# Patient Record
Sex: Female | Born: 1990 | Race: Black or African American | Hispanic: No | Marital: Single | Smoking: Former smoker
Health system: Southern US, Community
[De-identification: ages and names within clinical notes are randomized; demographics above are authoritative.]

## PROBLEM LIST (undated history)

## (undated) ENCOUNTER — Inpatient Hospital Stay (HOSPITAL_COMMUNITY): Payer: Self-pay

## (undated) DIAGNOSIS — R51 Headache: Secondary | ICD-10-CM

## (undated) DIAGNOSIS — R188 Other ascites: Secondary | ICD-10-CM

## (undated) DIAGNOSIS — N809 Endometriosis, unspecified: Secondary | ICD-10-CM

## (undated) DIAGNOSIS — R519 Headache, unspecified: Secondary | ICD-10-CM

## (undated) DIAGNOSIS — I10 Essential (primary) hypertension: Secondary | ICD-10-CM

## (undated) DIAGNOSIS — R011 Cardiac murmur, unspecified: Secondary | ICD-10-CM

## (undated) HISTORY — PX: NO PAST SURGERIES: SHX2092

## (undated) HISTORY — DX: Other ascites: R18.8

---
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
## 2009-02-20 ENCOUNTER — Emergency Department (HOSPITAL_COMMUNITY): Admission: EM | Admit: 2009-02-20 | Discharge: 2009-02-20 | Payer: Self-pay | Admitting: Family Medicine

## 2010-02-12 ENCOUNTER — Emergency Department (HOSPITAL_COMMUNITY)
Admission: EM | Admit: 2010-02-12 | Discharge: 2010-02-12 | Payer: Self-pay | Source: Home / Self Care | Admitting: Family Medicine

## 2010-10-09 LAB — POCT URINALYSIS DIP (DEVICE)
Bilirubin Urine: NEGATIVE
Glucose, UA: NEGATIVE mg/dL
Ketones, ur: NEGATIVE mg/dL
Nitrite: NEGATIVE
Protein, ur: NEGATIVE mg/dL
Specific Gravity, Urine: 1.025 (ref 1.005–1.030)
Urobilinogen, UA: 1 mg/dL (ref 0.0–1.0)
pH: 7 (ref 5.0–8.0)

## 2010-10-09 LAB — POCT I-STAT, CHEM 8
BUN: 7 mg/dL (ref 6–23)
Calcium, Ion: 1.32 mmol/L (ref 1.12–1.32)
Chloride: 106 mEq/L (ref 96–112)
Creatinine, Ser: 0.6 mg/dL (ref 0.4–1.2)
Glucose, Bld: 107 mg/dL — ABNORMAL HIGH (ref 70–99)
HCT: 40 % (ref 36.0–46.0)
Hemoglobin: 13.6 g/dL (ref 12.0–15.0)
Potassium: 4 mEq/L (ref 3.5–5.1)
Sodium: 141 mEq/L (ref 135–145)
TCO2: 23 mmol/L (ref 0–100)

## 2010-10-09 LAB — POCT PREGNANCY, URINE: Preg Test, Ur: NEGATIVE

## 2011-03-30 LAB — STREP A DNA PROBE: Group A Strep Probe: NEGATIVE

## 2011-03-30 LAB — POCT RAPID STREP A: Streptococcus, Group A Screen (Direct): NEGATIVE

## 2011-06-08 ENCOUNTER — Emergency Department (INDEPENDENT_AMBULATORY_CARE_PROVIDER_SITE_OTHER)
Admission: EM | Admit: 2011-06-08 | Discharge: 2011-06-08 | Disposition: A | Discharge status: Home / Self Care | Payer: Self-pay | Source: Home / Self Care

## 2011-06-08 DIAGNOSIS — R51 Headache: Secondary | ICD-10-CM

## 2011-06-08 DIAGNOSIS — H04203 Unspecified epiphora, bilateral lacrimal glands: Secondary | ICD-10-CM

## 2011-06-08 DIAGNOSIS — H04209 Unspecified epiphora, unspecified lacrimal gland: Secondary | ICD-10-CM

## 2011-06-08 HISTORY — DX: Cardiac murmur, unspecified: R01.1

## 2011-06-08 MED ORDER — SULFACETAMIDE SODIUM 10 % OP SOLN: 2.0000 [drp] | Freq: Three times a day (TID) | OPHTHALMIC | Status: AC

## 2011-06-08 NOTE — ED Provider Notes (Signed)
History     CSN: [id number] Arrival date & time: 06/08/2011 10:03 AM   None     Chief Complaint  Patient presents with  . Eye Pain    (Consider location/radiation/quality/duration/timing/severity/associated sxs/prior treatment) HPI Comments: Watering of Rt eye daily x 2 months - increased tearing. No redness, itching or irritation. Rt eye occasionally blurry. Wears glasses no contacts. States she has had 2 "migraines" in the last 2 months also. She has not been previously dx with migraines. Has a hx of these same HAs previously. Rt sided, throbbing. Lasts approx 4 hrs. Relieved with otc pain relievers such as Tylenol or Ibuprofen and rest. No nausea or vomiting, photophobia, phonophobia, numbness, tingling.  States the blurred vision in Rt eye occurs with the HA.   The history is provided by the patient.    Past Medical History  Diagnosis Date  . Heart murmur     History reviewed. No pertinent past surgical history.  History reviewed. No pertinent family history.  History  Substance Use Topics  . Smoking status: Current Everyday Smoker -- 0.5 packs/day  . Smokeless tobacco: Not on file  . Alcohol Use: Yes     occasional    OB History    Grav Para Term Preterm Abortions TAB SAB Ect Mult Living                  Review of Systems  Constitutional: Negative for fever and chills.  HENT: Negative for congestion and sinus pressure.   Eyes: Negative for photophobia, pain, discharge, redness, itching and visual disturbance.  Respiratory: Negative for shortness of breath.   Cardiovascular: Negative for chest pain.  Neurological: Positive for headaches. Negative for dizziness, weakness, light-headedness and numbness.    Allergies  Review of patient's allergies indicates no known allergies.  Home Medications  No current outpatient prescriptions on file.  BP 134/79  Pulse 71  Temp(Src) 98 F (36.7 C) (Oral)  Resp 17  SpO2 100%  LMP 06/01/2011  Physical Exam    Nursing note and vitals reviewed. Constitutional: She appears well-developed and well-nourished. No distress.  HENT:  Head: Normocephalic and atraumatic.  Right Ear: Tympanic membrane, external ear and ear canal normal.  Left Ear: Tympanic membrane, external ear and ear canal normal.  Nose: Nose normal.  Mouth/Throat: Uvula is midline, oropharynx is clear and moist and mucous membranes are normal. No oropharyngeal exudate, posterior oropharyngeal edema or posterior oropharyngeal erythema.  Eyes: Conjunctivae, EOM and lids are normal. Pupils are equal, round, and reactive to light.  Fundoscopic exam:      The right eye shows no AV nicking, no hemorrhage and no papilledema.       The left eye shows no AV nicking, no hemorrhage and no papilledema.  Neck: Neck supple.  Cardiovascular: Normal rate, regular rhythm and normal heart sounds.   Pulmonary/Chest: Effort normal and breath sounds normal. No respiratory distress.  Lymphadenopathy:    She has no cervical adenopathy.  Neurological: She is alert. She has normal strength. No cranial nerve deficit. Gait normal.  Skin: Skin is warm and dry.  Psychiatric: She has a normal mood and affect.    ED Course  Procedures (including critical care time)  Labs Reviewed - No data to display No results found.   No diagnosis found.    MDM          Melody Comas, PA 06/08/11 1135

## 2011-06-08 NOTE — ED Notes (Signed)
C/o frequent watering of rt eye for 2 months, states she occasionally has rt side headache and vision in rt eye is occasionally blurred.  Denies injury or pain.  Wears glasses, no recent eye exam.  Hasnt been wearing glasses

## 2011-06-09 NOTE — ED Provider Notes (Signed)
Medical screening examination/treatment/procedure(s) were performed by non-physician practitioner and as supervising physician I was immediately available for consultation/collaboration.   MORENO-COLL,Jaire Pinkham; MD   Ranier Coach Moreno-Coll, MD 06/09/11 0740 

## 2013-04-08 ENCOUNTER — Encounter (HOSPITAL_COMMUNITY): Payer: Self-pay | Admitting: Emergency Medicine

## 2013-04-08 ENCOUNTER — Emergency Department (INDEPENDENT_AMBULATORY_CARE_PROVIDER_SITE_OTHER)
Admission: EM | Admit: 2013-04-08 | Discharge: 2013-04-08 | Disposition: A | Discharge status: Home / Self Care | Payer: Self-pay | Source: Home / Self Care | Attending: Family Medicine | Admitting: Family Medicine

## 2013-04-08 DIAGNOSIS — L089 Local infection of the skin and subcutaneous tissue, unspecified: Secondary | ICD-10-CM

## 2013-04-08 DIAGNOSIS — L723 Sebaceous cyst: Secondary | ICD-10-CM

## 2013-04-08 MED ORDER — SULFAMETHOXAZOLE-TRIMETHOPRIM 800-160 MG PO TABS: 1.0000 | ORAL_TABLET | Freq: Two times a day (BID) | ORAL | Status: DC

## 2013-04-08 NOTE — ED Provider Notes (Signed)
Angelica Blankenship is a 22 y.o. female who presents to Urgent Care today for left axillary abscess. Patient is noted pain and swelling in her left ankle about one week. She has a history of abscesses there frequently in the past. She's tried warm compress which hasn't helped much. He does seem to be draining some small amount of pus. She denies any fevers chills nausea vomiting or diarrhea. She feels well otherwise.    Past Medical History  Diagnosis Date  . Heart murmur    History  Substance Use Topics  . Smoking status: Current Every Day Smoker -- 0.50 packs/day  . Smokeless tobacco: Not on file  . Alcohol Use: Yes     Comment: occasional   ROS as above Medications reviewed. No current facility-administered medications for this encounter.   Current Outpatient Prescriptions  Medication Sig Dispense Refill  . sulfamethoxazole-trimethoprim (SEPTRA DS) 800-160 MG per tablet Take 1 tablet by mouth every 12 (twelve) hours.  14 tablet  0    Exam:  BP 140/74  Pulse 80  Temp(Src) 98.3 F (36.8 C) (Oral)  Resp 16  SpO2 100%  LMP 04/07/2013 Gen: Well NAD LEFT AXILLA: Quarter size area of induration with small nickel-sized area of fluctuance. Tender to palpation. Erythematous present. Some small amount of whitish material is expressible.   Left axilla incision and drainage of abscess.  Consent obtained.  Area cleaned with alcohol and cold spray applied and 3 mL of lidocaine with epinephrine was injected overlying the area of fluctuance.  A sharp incision was made is 11 blade scalpel and a great quantity of pus was expressed. Pus was cultured. Further pus was expressed including what appeared to be cyst wall structures.  Loculations were broken up with wooden Q-tips. Further pus was expressed.  The incision was widened and about 6 inches of quarter inch packing material was inserted into the abscess. A dressing was applied Patient tolerated the procedure well  Assessment and Plan: 21  y.o. female with infected sebaceous cyst of the left axilla.  The cyst has been drained and packed Culture is pending Empiric antibiotics with Septra.  Remove packing material in 3 days followup as needed Discussed warning signs or symptoms. Please see discharge instructions. Patient expresses understanding.      Rodolph Bong, MD 04/08/13 [phone]

## 2013-04-08 NOTE — ED Notes (Signed)
C/o of abscess under left axillary x 2 wks. Pt states that she has had some drainage. Area is still red and swollen denies pain at this time.

## 2013-04-11 LAB — CULTURE, ROUTINE-ABSCESS: Special Requests: NORMAL

## 2014-04-02 ENCOUNTER — Encounter (HOSPITAL_COMMUNITY): Payer: Self-pay | Admitting: Emergency Medicine

## 2014-04-02 ENCOUNTER — Emergency Department (INDEPENDENT_AMBULATORY_CARE_PROVIDER_SITE_OTHER)
Admission: EM | Admit: 2014-04-02 | Discharge: 2014-04-02 | Disposition: A | Discharge status: Home / Self Care | Payer: Self-pay | Source: Home / Self Care | Attending: Emergency Medicine | Admitting: Emergency Medicine

## 2014-04-02 DIAGNOSIS — IMO0002 Reserved for concepts with insufficient information to code with codable children: Secondary | ICD-10-CM

## 2014-04-02 DIAGNOSIS — L02411 Cutaneous abscess of right axilla: Secondary | ICD-10-CM

## 2014-04-02 MED ORDER — LIDOCAINE-EPINEPHRINE (PF) 2 %-1:200000 IJ SOLN
INTRAMUSCULAR | Status: AC
  Filled 2014-04-02: qty 20

## 2014-04-02 NOTE — Discharge Instructions (Signed)

## 2014-04-02 NOTE — ED Notes (Signed)
Reports abscess right side axilla onset 2 months Pain is 5/10; denies drainage Alert, no signs of acute distress.

## 2014-04-02 NOTE — ED Notes (Signed)
Applied 4x4 gauze; secured w/hypofix

## 2014-04-02 NOTE — ED Provider Notes (Signed)
CSN: [id number]     Arrival date & time 04/02/14  1628 History   First MD Initiated Contact with Patient 04/02/14 1705     Chief Complaint  Patient presents with  . Abscess   (Consider location/radiation/quality/duration/timing/severity/associated sxs/prior Treatment) Patient is a 23 y.o. female presenting with abscess.  Abscess     23 year old female presents for evaluation of an abscess in her right axilla. She has pain and swelling present for about 2 months. It comes and goes but has never drained. She has had this before in the left axilla, and a couple of times in the right. She denies any systemic symptoms. It is painful, 5/10 in severity. Tetanus is up-to-date  Past Medical History  Diagnosis Date  . Heart murmur    History reviewed. No pertinent past surgical history. No family history on file. History  Substance Use Topics  . Smoking status: Current Every Day Smoker -- 1.00 packs/day    Types: Cigarettes  . Smokeless tobacco: Not on file  . Alcohol Use: Yes     Comment: occasional   OB History   Grav Para Term Preterm Abortions TAB SAB Ect Mult Living                 Review of Systems  Skin:       Abscess in right axilla, see history of present illness  All other systems reviewed and are negative.   Allergies  Review of patient's allergies indicates no known allergies.  Home Medications   Prior to Admission medications   Medication Sig Start Date End Date Taking? Authorizing Provider  sulfamethoxazole-trimethoprim (SEPTRA DS) 800-160 MG per tablet Take 1 tablet by mouth every 12 (twelve) hours. 04/08/13   Rodolph BongEvan S Corey, MD   BP 126/67  Pulse 68  Temp(Src) 97.9 F (36.6 C) (Oral)  Resp 12  SpO2 100%  LMP 04/02/2014 Physical Exam  Nursing note and vitals reviewed. Constitutional: She is oriented to person, place, and time. Vital signs are normal. She appears well-developed and well-nourished. No distress.  HENT:  Head: Normocephalic and atraumatic.   Cardiovascular: Normal rate, regular rhythm and normal heart sounds.   Pulmonary/Chest: Effort normal and breath sounds normal. No respiratory distress.  Lymphadenopathy:    She has no axillary adenopathy.  Neurological: She is alert and oriented to person, place, and time. She has normal strength. Coordination normal.  Skin: Skin is warm and dry. No rash noted. She is not diaphoretic.  Area of fluctuance with surrounding induration in the right axilla, tender to palpation; no similar lesions elsewhere, no axillary lymphadenopathy  Psychiatric: She has a normal mood and affect. Judgment normal.    ED Course  INCISION AND DRAINAGE Date/Time: 04/02/2014 9:14 PM Performed by: Autumn MessingBAKER, Nealy Karapetian, H Authorized by: Leslee HomeKELLER, DAVID C Consent: Verbal consent obtained. Risks and benefits: risks, benefits and alternatives were discussed Consent given by: patient Patient identity confirmed: verbally with patient Time out: Immediately prior to procedure a "time out" was called to verify the correct patient, procedure, equipment, support staff and site/side marked as required. Type: abscess Body area: upper extremity (Right axilla) Anesthesia: local infiltration Local anesthetic: lidocaine 2% with epinephrine Scalpel size: 11 Incision type: single straight Complexity: simple Drainage: purulent Drainage amount: moderate Wound treatment: wound left open Packing material: 1/4 in iodoform gauze Patient tolerance: Patient tolerated the procedure well with no immediate complications. Comments: Blunt dissection to break up loculations. Abscess cavity flushed with copious normal saline.   (including critical care time) Labs  Review Labs Reviewed  CULTURE, ROUTINE-ABSCESS    Imaging Review No results found.   MDM   1. Abscess of axilla, right    Abscess, incised and drained. There is a small abscess, no antibiotics indicated at this point. Culture sent in case it is worsening. Schooley the  packing in place for 4 days and then remove it, and then continue warm soaks a few times daily. Followup when necessary    Graylon Good, PA-C 04/02/14 2118

## 2014-04-02 NOTE — ED Provider Notes (Signed)
Medical screening examination/treatment/procedure(s) were performed by non-physician practitioner and as supervising physician I was immediately available for consultation/collaboration.  Leslee Homeavid Markice Torbert, M.D.  Reuben Likesavid C Kandise Riehle, MD 04/02/14 2154

## 2014-04-05 LAB — CULTURE, ROUTINE-ABSCESS: Special Requests: NORMAL

## 2014-04-05 NOTE — ED Notes (Signed)
Abscess R axilla: Few Proteus Mirabilis.  Pt. had I and D. Dr. Lorenz CoasterKeller said it was small- no further action needed. Angelica Blankenship, Savanah Bayles M 04/05/2014

## 2014-06-04 ENCOUNTER — Inpatient Hospital Stay (HOSPITAL_COMMUNITY)
Admission: AD | Admit: 2014-06-04 | Discharge: 2014-06-05 | Disposition: A | Discharge status: Home / Self Care | Payer: Self-pay | Source: Ambulatory Visit | Attending: Obstetrics & Gynecology | Admitting: Obstetrics & Gynecology

## 2014-06-04 ENCOUNTER — Encounter (HOSPITAL_COMMUNITY): Payer: Self-pay

## 2014-06-04 DIAGNOSIS — R110 Nausea: Secondary | ICD-10-CM | POA: Insufficient documentation

## 2014-06-04 DIAGNOSIS — R1030 Lower abdominal pain, unspecified: Secondary | ICD-10-CM | POA: Insufficient documentation

## 2014-06-04 LAB — URINALYSIS, ROUTINE W REFLEX MICROSCOPIC
Bilirubin Urine: NEGATIVE
Glucose, UA: NEGATIVE mg/dL
Ketones, ur: 15 mg/dL — AB
Leukocytes, UA: NEGATIVE
Nitrite: NEGATIVE
Protein, ur: NEGATIVE mg/dL
Specific Gravity, Urine: >1.03 — ABNORMAL HIGH (ref 1.005–1.030)
Urobilinogen, UA: 1 mg/dL (ref 0.0–1.0)
pH: 6 (ref 5.0–8.0)

## 2014-06-04 LAB — URINE MICROSCOPIC-ADD ON

## 2014-06-04 LAB — POCT PREGNANCY, URINE: Preg Test, Ur: NEGATIVE

## 2014-06-04 NOTE — MAU Note (Signed)
Pt reports abdominal pain off and on since Saturday. Pt states that the pain is both sharp and crampy. Pt reports bleeding since today.

## 2014-06-04 NOTE — MAU Note (Signed)
Lower abdominal pain since Saturday. Start bleeding today, thinks its her period. LMP before now 10/24. Normally has regular periods. Denies urinary complaints. Nausea. No vomiting or diarrhea.

## 2014-06-05 ENCOUNTER — Encounter (HOSPITAL_COMMUNITY): Payer: Self-pay | Admitting: Family Medicine

## 2014-06-05 ENCOUNTER — Emergency Department (INDEPENDENT_AMBULATORY_CARE_PROVIDER_SITE_OTHER)
Admission: EM | Admit: 2014-06-05 | Discharge: 2014-06-05 | Disposition: A | Discharge status: Home / Self Care | Payer: Self-pay | Source: Home / Self Care | Attending: Family Medicine | Admitting: Family Medicine

## 2014-06-05 DIAGNOSIS — R109 Unspecified abdominal pain: Secondary | ICD-10-CM

## 2014-06-05 DIAGNOSIS — N946 Dysmenorrhea, unspecified: Secondary | ICD-10-CM

## 2014-06-05 MED ORDER — IBUPROFEN 600 MG PO TABS: 600.0000 mg | ORAL_TABLET | Freq: Four times a day (QID) | ORAL | Status: DC | PRN

## 2014-06-05 MED ORDER — ONDANSETRON 4 MG PO TBDP: 4.0000 mg | ORAL_TABLET | Freq: Three times a day (TID) | ORAL | Status: DC | PRN

## 2014-06-05 NOTE — ED Provider Notes (Signed)
CSN: [id number]     Arrival date & time 06/05/14  [id number] History   First MD Initiated Contact with Patient 06/05/14 0857     Chief Complaint  Patient presents with  . Abdominal Pain   (Consider location/radiation/quality/duration/timing/severity/associated sxs/prior Treatment) HPI  Abd pain: off and on since 05/31/14. Initially sharp but now crampy and achy. Taking OTC period pain medication. LMP 06/04/14. Pain is worse when laying down. Daily BMs. Sexually active w/ intermittent protection. Denies dysuria, frequency, vaginal discharge, fever, nausea, vomting, diarrhea. Period are regular but this is more painful than typical period.   [facility]en yesterday at [facility] for same symptoms but left before being treated.     Past Medical History  Diagnosis Date  . Heart murmur    History reviewed. No pertinent past surgical history. History reviewed. No pertinent family history. History  Substance Use Topics  . Smoking status: Current Every Day Smoker -- 1.00 packs/day    Types: Pipe  . Smokeless tobacco: Not on file  . Alcohol Use: Yes     Comment: occasional   OB History    Gravida Para Term Preterm AB TAB SAB Ectopic Multiple Living   1    1 1          Review of Systems Per HPI with all other pertinent systems negative.   Allergies  Review of patient's allergies indicates no known allergies.  Home Medications   Prior to Admission medications   Medication Sig Start Date End Date Taking? Authorizing Provider  acetaminophen (TYLENOL) 500 MG tablet Take 1,000 mg by mouth every 6 (six) hours as needed.   Yes Historical Provider, MD  APAP-Pamabrom-Pyrilamine (PAMPRIN MULTI-SYMPTOM) 500-25-15 MG TABS Take by mouth.   Yes Historical Provider, MD  aspirin-acetaminophen-caffeine (EXCEDRIN MIGRAINE) [phone] MG per tablet Take 2 tablets by mouth every 6 (six) hours as needed for headache.    Historical Provider, MD  ibuprofen (ADVIL,MOTRIN) 600 MG tablet Take 1 tablet (600 mg total) by mouth  every 6 (six) hours as needed. 06/05/14   Ozella Rocksavid J Audie Wieser, MD  ondansetron (ZOFRAN ODT) 4 MG disintegrating tablet Take 1 tablet (4 mg total) by mouth every 8 (eight) hours as needed for nausea or vomiting. 06/05/14   Ozella Rocksavid J Sueellen Kayes, MD  sulfamethoxazole-trimethoprim (SEPTRA DS) 800-160 MG per tablet Take 1 tablet by mouth every 12 (twelve) hours. 04/08/13   Rodolph BongEvan S Corey, MD   BP 122/79 mmHg  Pulse 72  Temp(Src) 99.1 F (37.3 C) (Oral)  Resp 14  SpO2 96%  LMP 06/04/2014 Physical Exam  Constitutional: She is oriented to person, place, and time. She appears well-developed and well-nourished. No distress.  HENT:  Head: Normocephalic and atraumatic.  Eyes: EOM are normal. Pupils are equal, round, and reactive to light.  Neck: Normal range of motion.  Cardiovascular: Normal rate, normal heart sounds and intact distal pulses.   No murmur heard. Pulmonary/Chest: Effort normal and breath sounds normal.  Abdominal: Soft. Bowel sounds are normal. She exhibits no distension and no mass. There is no tenderness. There is no rebound and no guarding.  Musculoskeletal: Normal range of motion. She exhibits no tenderness.  Neurological: She is alert and oriented to person, place, and time.  Skin: Skin is warm. She is not diaphoretic.  Psychiatric: She has a normal mood and affect. Her behavior is normal. Judgment and thought content normal.    ED Course  Procedures (including critical care time) Labs Review Labs Reviewed - No data to display  Imaging Review No  results found.   MDM   1. Menstrual cramp   2. Abdominal pain, unspecified abdominal location    Likely menstrual pain. UA and Upreg from [facility] yesterday nml. No need to repeat today Daily BM so constipation unlikely. No sign of vaginitis Zofran for occasional nausea  Motrin 600 prn pain F/u OBGYN - may need US for further evaulation  Precautions given and all questions answered   Shelly Flattenavid Xayne Brumbaugh, MD Family Medicine 06/05/2014, 9:18  AM     Ozella Rocksavid J Amando Chaput, MD 06/05/14 [phone]

## 2014-06-05 NOTE — Discharge Instructions (Signed)
You are likely suffering from menstrual pain or ovarian cysts or endometriosis There are no emergent issues today Please call and schedule an appointment with an OBGYN for follow-up Use the zofran for nausea and iburpofen for pain

## 2014-06-05 NOTE — ED Notes (Signed)
Pt states that she has had abdominal pain since 05/31/2014 with no relief from OTC Pamprin and tylenol.

## 2014-07-02 ENCOUNTER — Inpatient Hospital Stay (HOSPITAL_COMMUNITY)
Admission: AD | Admit: 2014-07-02 | Discharge: 2014-07-02 | Disposition: A | Discharge status: Home / Self Care | Payer: Self-pay | Source: Ambulatory Visit | Attending: Obstetrics & Gynecology | Admitting: Obstetrics & Gynecology

## 2014-07-02 ENCOUNTER — Inpatient Hospital Stay (HOSPITAL_COMMUNITY): Payer: Self-pay

## 2014-07-02 ENCOUNTER — Encounter (HOSPITAL_COMMUNITY): Payer: Self-pay

## 2014-07-02 DIAGNOSIS — Z3A01 Less than 8 weeks gestation of pregnancy: Secondary | ICD-10-CM | POA: Insufficient documentation

## 2014-07-02 DIAGNOSIS — F17290 Nicotine dependence, other tobacco product, uncomplicated: Secondary | ICD-10-CM | POA: Insufficient documentation

## 2014-07-02 DIAGNOSIS — O99331 Smoking (tobacco) complicating pregnancy, first trimester: Secondary | ICD-10-CM | POA: Insufficient documentation

## 2014-07-02 DIAGNOSIS — N8320 Unspecified ovarian cysts: Secondary | ICD-10-CM | POA: Insufficient documentation

## 2014-07-02 DIAGNOSIS — R109 Unspecified abdominal pain: Secondary | ICD-10-CM | POA: Insufficient documentation

## 2014-07-02 DIAGNOSIS — O9989 Other specified diseases and conditions complicating pregnancy, childbirth and the puerperium: Secondary | ICD-10-CM

## 2014-07-02 DIAGNOSIS — O3481 Maternal care for other abnormalities of pelvic organs, first trimester: Secondary | ICD-10-CM | POA: Insufficient documentation

## 2014-07-02 DIAGNOSIS — O26899 Other specified pregnancy related conditions, unspecified trimester: Secondary | ICD-10-CM

## 2014-07-02 LAB — URINALYSIS, ROUTINE W REFLEX MICROSCOPIC
Bilirubin Urine: NEGATIVE
Glucose, UA: NEGATIVE mg/dL
Hgb urine dipstick: NEGATIVE
Ketones, ur: NEGATIVE mg/dL
Leukocytes, UA: NEGATIVE
Nitrite: NEGATIVE
Protein, ur: NEGATIVE mg/dL
Specific Gravity, Urine: 1.02 (ref 1.005–1.030)
Urobilinogen, UA: 1 mg/dL (ref 0.0–1.0)
pH: 6 (ref 5.0–8.0)

## 2014-07-02 LAB — CBC WITH DIFFERENTIAL/PLATELET
Basophils Absolute: 0 10*3/uL (ref 0.0–0.1)
Basophils Relative: 0 % (ref 0–1)
Eosinophils Absolute: 0.1 10*3/uL (ref 0.0–0.7)
Eosinophils Relative: 1 % (ref 0–5)
HCT: 30.4 % — ABNORMAL LOW (ref 36.0–46.0)
Hemoglobin: 10.5 g/dL — ABNORMAL LOW (ref 12.0–15.0)
Lymphocytes Relative: 52 % — ABNORMAL HIGH (ref 12–46)
Lymphs Abs: 3.2 10*3/uL (ref 0.7–4.0)
MCH: 32.7 pg (ref 26.0–34.0)
MCHC: 34.5 g/dL (ref 30.0–36.0)
MCV: 94.7 fL (ref 78.0–100.0)
Monocytes Absolute: 0.5 10*3/uL (ref 0.1–1.0)
Monocytes Relative: 8 % (ref 3–12)
Neutro Abs: 2.4 10*3/uL (ref 1.7–7.7)
Neutrophils Relative %: 39 % — ABNORMAL LOW (ref 43–77)
Platelets: 232 10*3/uL (ref 150–400)
RBC: 3.21 MIL/uL — ABNORMAL LOW (ref 3.87–5.11)
RDW: 13.8 % (ref 11.5–15.5)
WBC: 6.2 10*3/uL (ref 4.0–10.5)

## 2014-07-02 LAB — HCG, QUANTITATIVE, PREGNANCY: hCG, Beta Chain, Quant, S: 188 m[IU]/mL — ABNORMAL HIGH (ref <5)

## 2014-07-02 LAB — ABO/RH: ABO/RH(D): A POS

## 2014-07-02 IMAGING — US US OB COMP LESS 14 WK
1 series · 13 of 28 positions shown · non-contrast
Comparison: None of the gestation

CLINICAL DATA: Abdominal pain and pregnancy.

EXAM:
OBSTETRIC <14 WK US AND TRANSVAGINAL OB US
TECHNIQUE: Both transabdominal and transvaginal ultrasound examinations were
performed for complete evaluation of the gestation as well as the
maternal uterus, adnexal regions, and pelvic cul-de-sac.
Transvaginal technique was performed to assess early pregnancy.

[Series 1: us ob comp less 14 wks · 66 acquisitions, 13 frames shown]
[im 3/66]
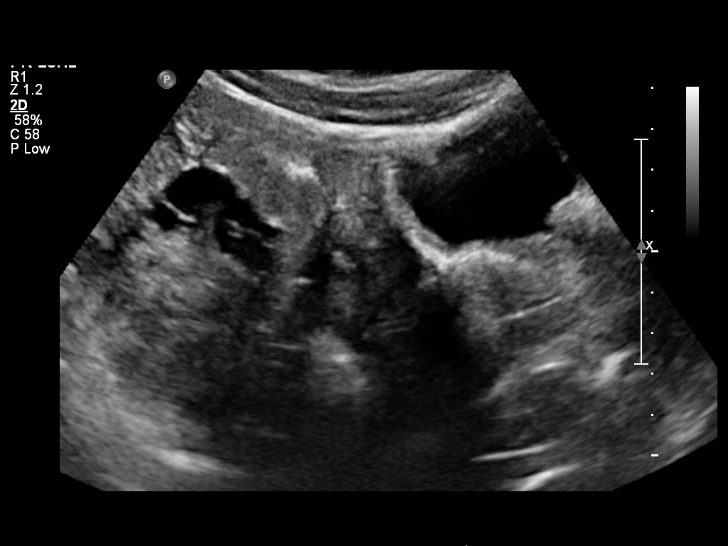
[im 8/66]
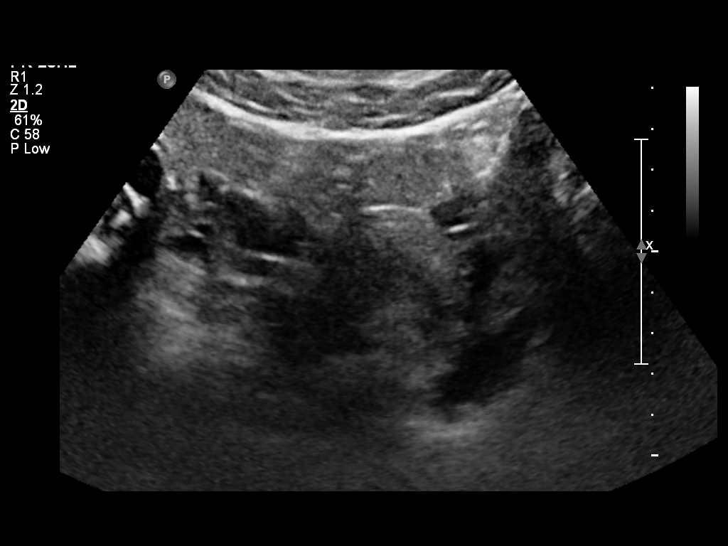
[im 13/66]
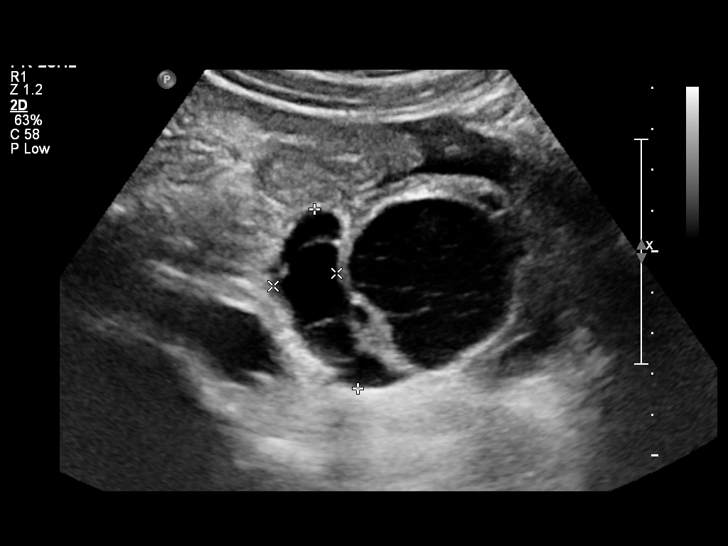
[im 17/66]
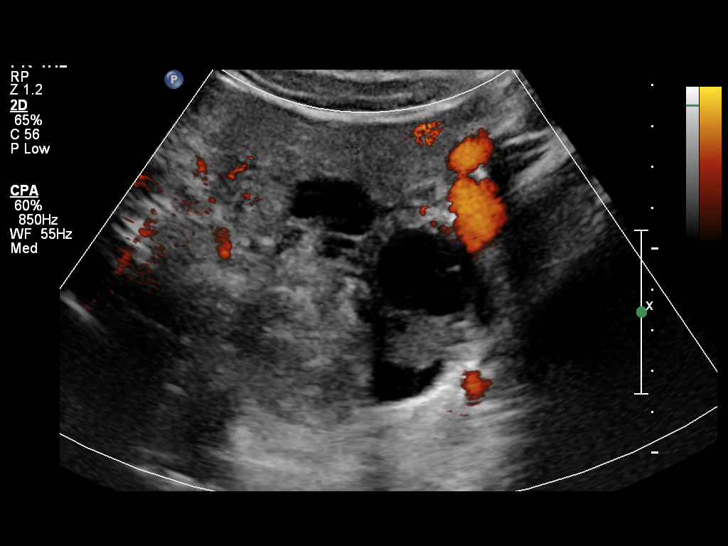
[im 22/66]
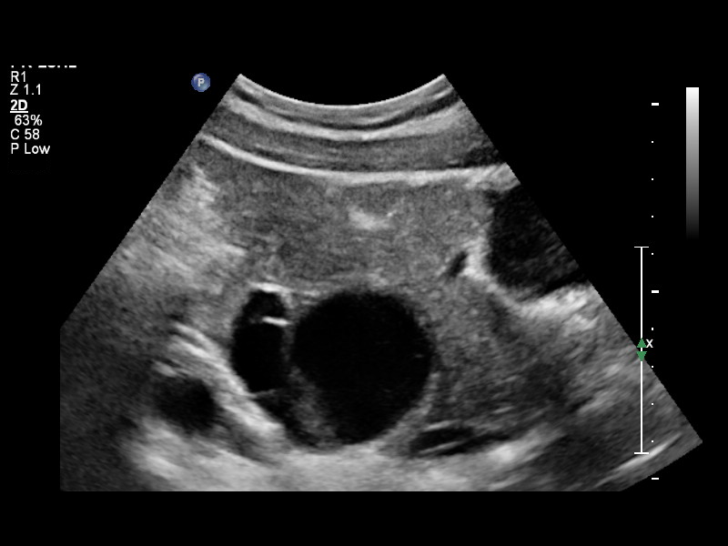
[im 27/66]
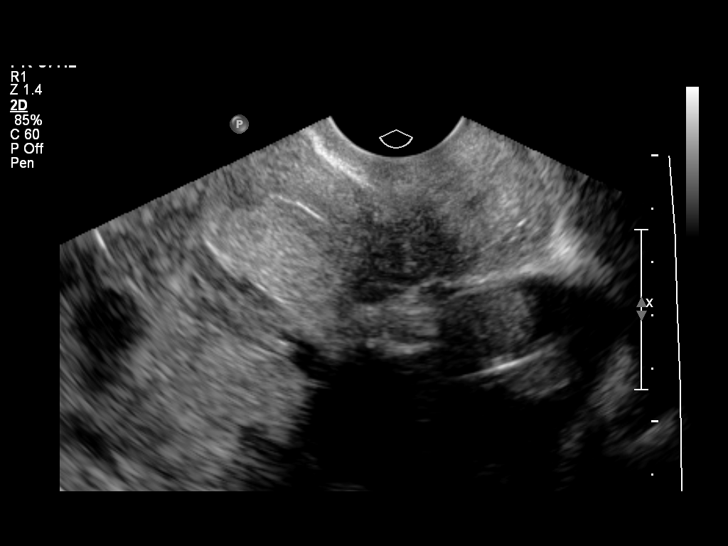
[im 34/66]
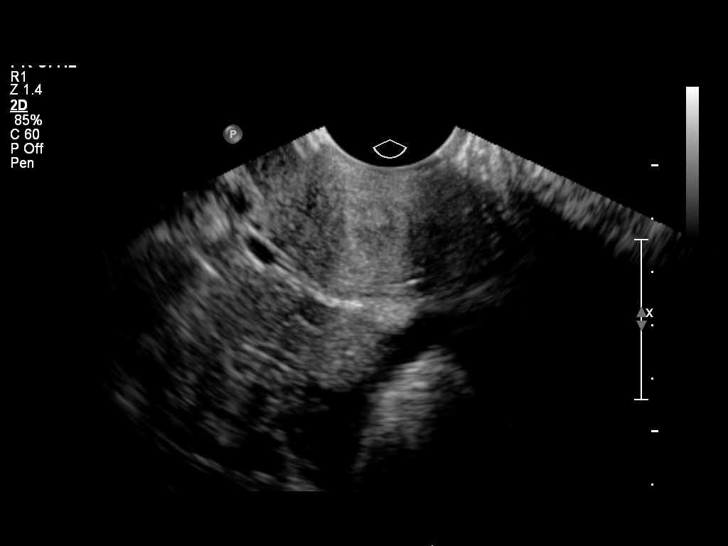
[im 39/66]
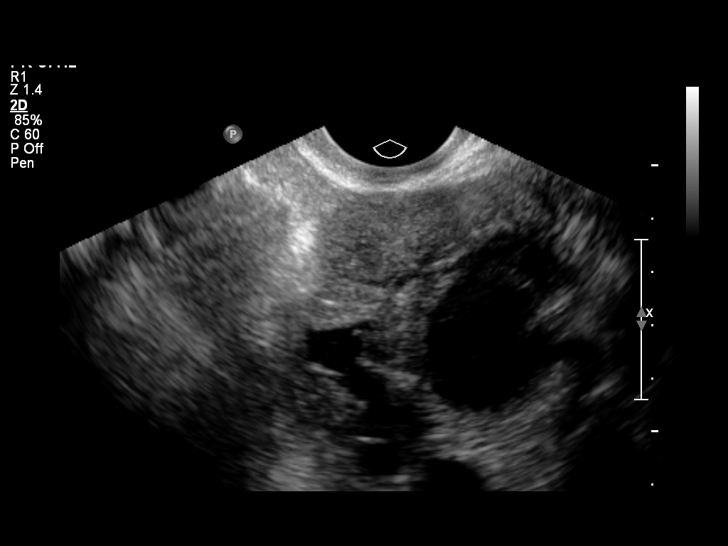
[im 44/66]
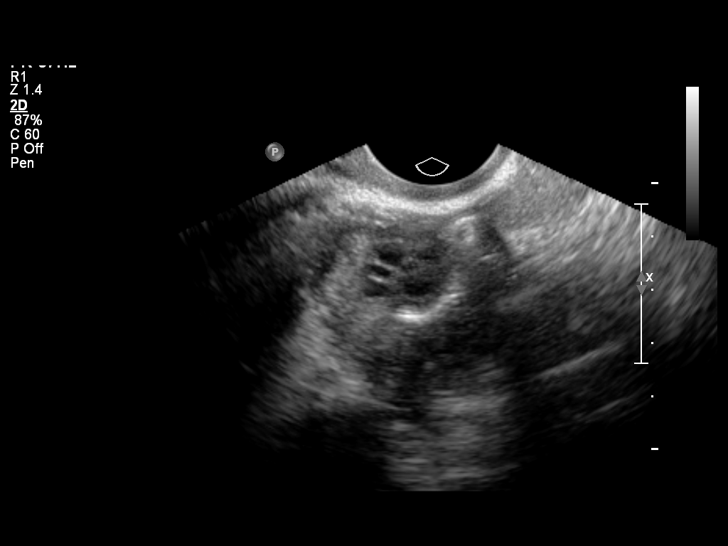
[im 49/66]
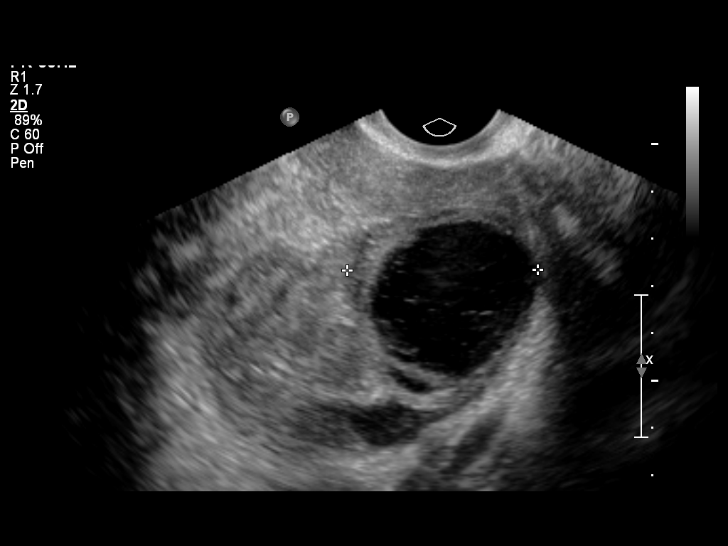
[im 53/66]
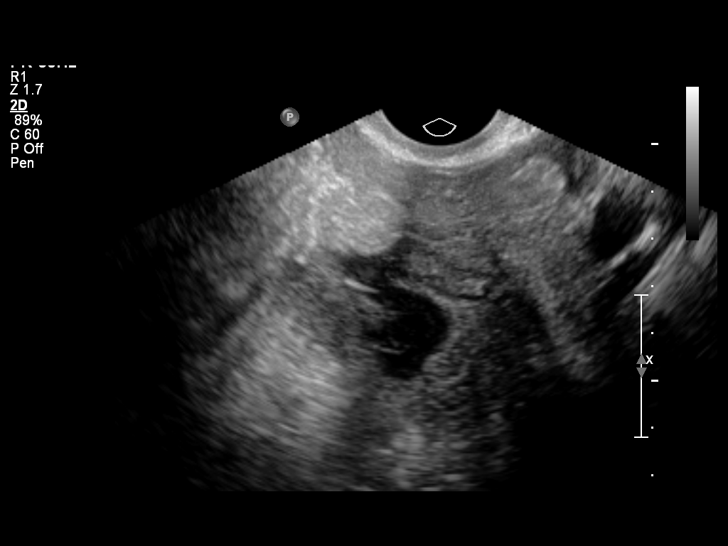
[im 58/66]
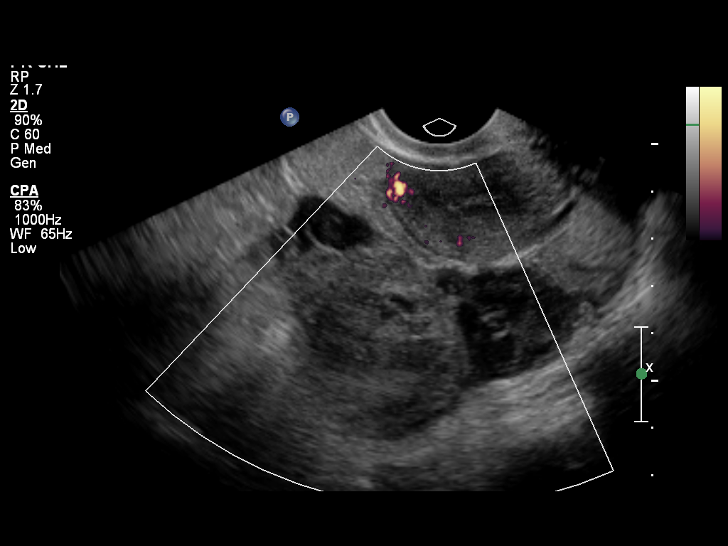
[im 63/66]
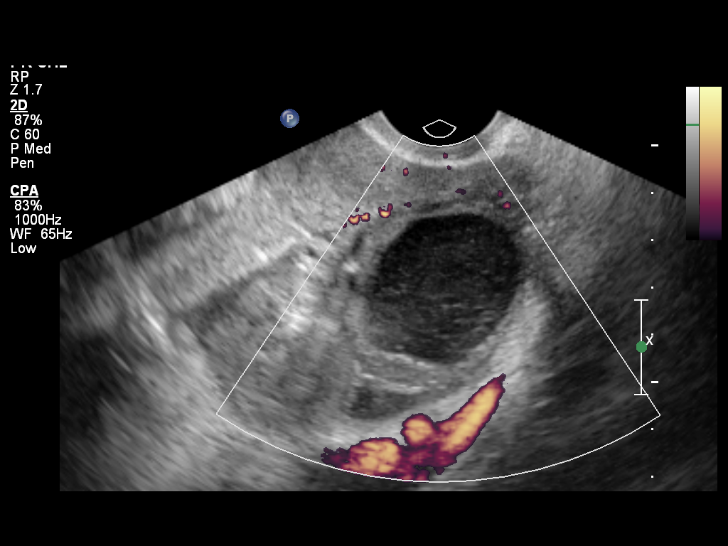

[13 of 28 positions shown; findings below may reference images not displayed]

FINDINGS: No definite intra or extrauterine gestational sac is identified. The
uterus is normal. Abnormal appearance of the left adnexa. At a
minimum, there is a 4 cm left adnexal cyst with internal lace-like
echoes - most consistent with a hemorrhagic cyst. A neighboring 4 cm
solid-appearing structure appears to be the ovarian parenchyma,
there is no definitive extra ovarian mass. Small tubular structure
in the left adnexa, suspect mild hydrosalpinx. No hemo peritoneum.
The right ovary is normal.

These results were called by telephone at the time of interpretation
on [DATE] at [DATE] to Dr. KLPIGBB , who verbally
acknowledged these results.
IMPRESSION: 1. Pregnancy of unknown location. The left adnexa is abnormal, which
increases chances of ectopic pregnancy, although a definite ectopic
is not identified. Differential considerations include intrauterine
gestation too early to be sonographically visualized, spontaneous
abortion, or ectopic pregnancy. Close follow up is recommended.
2. 4 cm complicated cyst in the left ovary favoring hemorrhagic
cyst.
3. Question mild left hydrosalpinx.

## 2014-07-02 NOTE — [facility] Note (Signed)
Patient states [facility]. Wants confirmation. Denies pain, bleeding, discharge, nausea or vomiting.

## 2014-07-02 NOTE — MAU Note (Signed)
BHCG pending, pt in lobby

## 2014-07-02 NOTE — Discharge Instructions (Signed)
Abdominal Pain During Pregnancy °Belly (abdominal) pain is common during pregnancy. Most of the time, it is not a serious problem. Other times, it can be a sign that something is wrong with the pregnancy. Always tell your doctor if you have belly pain. °HOME CARE °Monitor your belly pain for any changes. The following actions may help you feel better: °· Do not have sex (intercourse) or put anything in your vagina until you feel better. °· Rest until your pain stops. °· Drink clear fluids if you feel sick to your stomach (nauseous). Do not eat solid food until you feel better. °· Only take medicine as told by your doctor. °· Keep all doctor visits as told. °GET HELP RIGHT AWAY IF:  °· You are bleeding, leaking fluid, or pieces of tissue come out of your vagina. °· You have more pain or cramping. °· You keep throwing up (vomiting). °· You have pain when you pee (urinate) or have blood in your pee. °· You have a fever. °· You do not feel your baby moving as much. °· You feel very weak or feel like passing out. °· You have trouble breathing, with or without belly pain. °· You have a very bad headache and belly pain. °· You have fluid leaking from your vagina and belly pain. °· You keep having watery poop (diarrhea). °· Your belly pain does not go away after resting, or the pain gets worse. °MAKE SURE YOU:  °· Understand these instructions. °· Will watch your condition. °· Will get help right away if you are not doing well or get worse. °Document Released: 06/08/2009 Document Revised: 02/20/2013 Document Reviewed: 01/17/2013 °[employer]® Patient Information ©2015 [employer]. This information is not intended to replace advice given to you by your health care provider. Make sure you discuss any questions you have with your health care provider. ° °

## 2014-07-02 NOTE — MAU Provider Note (Signed)
History     CSN: [id number]  Arrival date and time: 07/02/14 1820   None     Chief Complaint  Patient presents with  . Possible Pregnancy   HPI  Angelica Blankenship is a 23 y.o. G2P0010 at [redacted]w[redacted]d who presents to MAU today with complaint of abdominal pain. The patient states that she had confirmation of pregnancy at [facility] yesterday. She states mild cramping x 2 days, however no pain now. She states last bleeding was noted 06/18/14-06/23/14. She denies UTI symptoms, fever or N/V/D.   OB History    Gravida Para Term Preterm AB TAB SAB Ectopic Multiple Living   2    1 1           Past Medical History  Diagnosis Date  . Heart murmur     Past Surgical History  Procedure Laterality Date  . No past surgeries      History reviewed. No pertinent family history.  History  Substance Use Topics  . Smoking status: Current Every Day Smoker -- 1.00 packs/day    Types: Pipe  . Smokeless tobacco: Not on file  . Alcohol Use: Yes     Comment: occasional    Allergies: No Known Allergies  No prescriptions prior to admission    Review of Systems  Constitutional: Negative for fever and malaise/fatigue.  Gastrointestinal: Positive for abdominal pain. Negative for nausea, vomiting, diarrhea and constipation.  Genitourinary: Negative for dysuria, urgency and frequency.       Neg - vaginal bleeding   Physical Exam   Blood pressure 129/67, pulse 70, temperature 99.2 F (37.3 C), temperature source Oral, resp. rate 16, height 5\' 6"  (1.676 m), weight 156 lb 12.8 oz (71.124 kg), last menstrual period 06/18/2014, SpO2 100 %.  Physical Exam  Constitutional: She is oriented to person, place, and time. She appears well-developed and well-nourished. No distress.  HENT:  Head: Normocephalic.  Cardiovascular: Normal rate.   Respiratory: Effort normal.  GI: Soft. She exhibits no distension and no mass. There is no tenderness. There is no rebound and no guarding.  Neurological: She is alert and  oriented to person, place, and time.  Skin: Skin is warm and dry. No erythema.  Psychiatric: She has a normal mood and affect.   Results for orders placed or performed during the hospital encounter of 07/02/14 (from the past 24 hour(s))  Urinalysis, Routine w reflex microscopic     Status: None   Collection Time: 07/02/14  6:45 PM  Result Value Ref Range   Color, Urine YELLOW YELLOW   APPearance CLEAR CLEAR   Specific Gravity, Urine 1.020 1.005 - 1.030   pH 6.0 5.0 - 8.0   Glucose, UA NEGATIVE NEGATIVE mg/dL   Hgb urine dipstick NEGATIVE NEGATIVE   Bilirubin Urine NEGATIVE NEGATIVE   Ketones, ur NEGATIVE NEGATIVE mg/dL   Protein, ur NEGATIVE NEGATIVE mg/dL   Urobilinogen, UA 1.0 0.0 - 1.0 mg/dL   Nitrite NEGATIVE NEGATIVE   Leukocytes, UA NEGATIVE NEGATIVE  hCG, quantitative, pregnancy     Status: Abnormal   Collection Time: 07/02/14  6:59 PM  Result Value Ref Range   hCG, Beta Chain, Quant, S 188 (H) <5 mIU/mL  CBC with Differential     Status: Abnormal   Collection Time: 07/02/14  7:06 PM  Result Value Ref Range   WBC 6.2 4.0 - 10.5 K/uL   RBC 3.21 (L) 3.87 - 5.11 MIL/uL   Hemoglobin 10.5 (L) 12.0 - 15.0 g/dL   HCT 40.9 (L)  36.0 - 46.0 %   MCV 94.7 78.0 - 100.0 fL   MCH 32.7 26.0 - 34.0 pg   MCHC 34.5 30.0 - 36.0 g/dL   RDW 09.813.8 11.911.5 - 14.715.5 %   Platelets 232 150 - 400 K/uL   Neutrophils Relative % 39 (L) 43 - 77 %   Neutro Abs 2.4 1.7 - 7.7 K/uL   Lymphocytes Relative 52 (H) 12 - 46 %   Lymphs Abs 3.2 0.7 - 4.0 K/uL   Monocytes Relative 8 3 - 12 %   Monocytes Absolute 0.5 0.1 - 1.0 K/uL   Eosinophils Relative 1 0 - 5 %   Eosinophils Absolute 0.1 0.0 - 0.7 K/uL   Basophils Relative 0 0 - 1 %   Basophils Absolute 0.0 0.0 - 0.1 K/uL  ABO/Rh     Status: None   Collection Time: 07/02/14  7:06 PM  Result Value Ref Range   ABO/RH(D) A POS    [place] Ob Comp Less 14 Wks  07/02/2014   CLINICAL DATA:  Abdominal pain and pregnancy.  EXAM: OBSTETRIC <14 WK US AND TRANSVAGINAL OB  US  TECHNIQUE: Both transabdominal and transvaginal ultrasound examinations were performed for complete evaluation of the gestation as well as the maternal uterus, adnexal regions, and pelvic cul-de-sac. Transvaginal technique was performed to assess early pregnancy.  COMPARISON:  None of the gestation  FINDINGS: No definite intra or extrauterine gestational sac is identified. The uterus is normal. Abnormal appearance of the left adnexa. At a minimum, there is a 4 cm left adnexal cyst with internal lace-like echoes - most consistent with a hemorrhagic cyst. A neighboring 4 cm solid-appearing structure appears to be the ovarian parenchyma, there is no definitive extra ovarian mass. Small tubular structure in the left adnexa, suspect mild hydrosalpinx. No hemo peritoneum. The right ovary is normal.  These results were called by telephone at the time of interpretation on 07/02/2014 at 9:42 pm to Dr. Vonzella NippleJULIE Kylar Leonhardt , who verbally acknowledged these results.  IMPRESSION: 1. Pregnancy of unknown location. The left adnexa is abnormal, which increases chances of ectopic pregnancy, although a definite ectopic is not identified. Differential considerations include intrauterine gestation too early to be sonographically visualized, spontaneous abortion, or ectopic pregnancy. Close follow up is recommended. 2. 4 cm complicated cyst in the left ovary favoring hemorrhagic cyst. 3. Question mild left hydrosalpinx.   Electronically Signed   By: Tiburcio PeaJonathan  Watts M.D.   On: 07/02/2014 21:45   [place] Ob Transvaginal  07/02/2014   CLINICAL DATA:  Abdominal pain and pregnancy.  EXAM: OBSTETRIC <14 WK US AND TRANSVAGINAL OB US  TECHNIQUE: Both transabdominal and transvaginal ultrasound examinations were performed for complete evaluation of the gestation as well as the maternal uterus, adnexal regions, and pelvic cul-de-sac. Transvaginal technique was performed to assess early pregnancy.  COMPARISON:  None of the gestation  FINDINGS: No  definite intra or extrauterine gestational sac is identified. The uterus is normal. Abnormal appearance of the left adnexa. At a minimum, there is a 4 cm left adnexal cyst with internal lace-like echoes - most consistent with a hemorrhagic cyst. A neighboring 4 cm solid-appearing structure appears to be the ovarian parenchyma, there is no definitive extra ovarian mass. Small tubular structure in the left adnexa, suspect mild hydrosalpinx. No hemo peritoneum. The right ovary is normal.  These results were called by telephone at the time of interpretation on 07/02/2014 at 9:42 pm to Dr. Vonzella NippleJULIE Jaasia Viglione , who verbally acknowledged these results.  IMPRESSION:  1. Pregnancy of unknown location. The left adnexa is abnormal, which increases chances of ectopic pregnancy, although a definite ectopic is not identified. Differential considerations include intrauterine gestation too early to be sonographically visualized, spontaneous abortion, or ectopic pregnancy. Close follow up is recommended. 2. 4 cm complicated cyst in the left ovary favoring hemorrhagic cyst. 3. Question mild left hydrosalpinx.   Electronically Signed   By: Tiburcio PeaJonathan  Watts M.D.   On: 07/02/2014 21:45    MAU Course  Procedures  None  MDM UPT -negative UA, quant hCG, CBC and ABO/Rh today Discussed patient and US results with Dr. Erin FullingHarraway-Smith, since patient is stable and without severe pain, follow-up for quant hCG in 48 hours.  Assessment and Plan  A: Pregnancy of unknown location Abdominal pain in pregnancy, first trimester Ovarian cyst and possible hydrosalpinx  P: Discharge home Ectopic precautions disucssed Patient to return to MAU in 48 hours for repeat labs or sooner PRN  Marny LowensteinJulie N Krishay Faro, PA-C  07/03/2014, 12:42 AM

## 2014-07-04 ENCOUNTER — Inpatient Hospital Stay (HOSPITAL_COMMUNITY)
Admission: AD | Admit: 2014-07-04 | Discharge: 2014-07-04 | Disposition: A | Discharge status: Home / Self Care | Payer: Self-pay | Source: Ambulatory Visit | Attending: Obstetrics and Gynecology | Admitting: Obstetrics and Gynecology

## 2014-07-04 DIAGNOSIS — O039 Complete or unspecified spontaneous abortion without complication: Secondary | ICD-10-CM | POA: Insufficient documentation

## 2014-07-04 DIAGNOSIS — Z3A01 Less than 8 weeks gestation of pregnancy: Secondary | ICD-10-CM | POA: Insufficient documentation

## 2014-07-04 LAB — HCG, QUANTITATIVE, PREGNANCY: hCG, Beta Chain, Quant, S: 98 m[IU]/mL — ABNORMAL HIGH (ref <5)

## 2014-07-04 NOTE — MAU Note (Signed)
Here for f/u labs. Denies abdominal pain or vaginal bleeding.

## 2014-07-04 NOTE — Discharge Instructions (Signed)
Miscarriage °A miscarriage is the sudden loss of an unborn baby (fetus) before the 20th week of pregnancy. Most miscarriages happen in the first 3 months of pregnancy. Sometimes, it happens before a woman even knows she is pregnant. A miscarriage is also called a "spontaneous miscarriage" or "early pregnancy loss." Having a miscarriage can be an emotional experience. Talk with your caregiver about any questions you may have about miscarrying, the grieving process, and your future pregnancy plans. °CAUSES  °· Problems with the fetal chromosomes that make it impossible for the baby to develop normally. Problems with the baby's genes or chromosomes are most often the result of errors that occur, by chance, as the embryo divides and grows. The problems are not inherited from the parents. °· Infection of the cervix or uterus.   °· Hormone problems.   °· Problems with the cervix, such as having an incompetent cervix. This is when the tissue in the cervix is not strong enough to hold the pregnancy.   °· Problems with the uterus, such as an abnormally shaped uterus, uterine fibroids, or congenital abnormalities.   °· Certain medical conditions.   °· Smoking, drinking alcohol, or taking illegal drugs.   °· Trauma.   °Often, the cause of a miscarriage is unknown.  °SYMPTOMS  °· Vaginal bleeding or spotting, with or without cramps or pain. °· Pain or cramping in the abdomen or lower back. °· Passing fluid, tissue, or blood clots from the vagina. °DIAGNOSIS  °Your caregiver will perform a physical exam. You may also have an ultrasound to confirm the miscarriage. Blood or urine tests may also be ordered. °TREATMENT  °· Sometimes, treatment is not necessary if you naturally pass all the fetal tissue that was in the uterus. If some of the fetus or placenta remains in the body (incomplete miscarriage), tissue left behind may become infected and must be removed. Usually, a dilation and curettage (D and C) procedure is performed.  During a D and C procedure, the cervix is widened (dilated) and any remaining fetal or placental tissue is gently removed from the uterus. °· Antibiotic medicines are prescribed if there is an infection. Other medicines may be given to reduce the size of the uterus (contract) if there is a lot of bleeding. °· If you have Rh negative blood and your baby was Rh positive, you will need a Rh immunoglobulin shot. This shot will protect any future baby from having Rh blood problems in future pregnancies. °HOME CARE INSTRUCTIONS  °· Your caregiver may order bed rest or may allow you to continue light activity. Resume activity as directed by your caregiver. °· Have someone help with home and family responsibilities during this time.   °· Keep track of the number of sanitary pads you use each day and how soaked (saturated) they are. Write down this information.   °· Do not use tampons. Do not douche or have sexual intercourse until approved by your caregiver.   °· Only take over-the-counter or prescription medicines for pain or discomfort as directed by your caregiver.   °· Do not take aspirin. Aspirin can cause bleeding.   °· Keep all follow-up appointments with your caregiver.   °· If you or your partner have problems with grieving, talk to your caregiver or seek counseling to help cope with the pregnancy loss. Allow enough time to grieve before trying to get pregnant again.   °SEEK IMMEDIATE MEDICAL CARE IF:  °· You have severe cramps or pain in your back or abdomen. °· You have a fever. °· You pass large blood clots (walnut-sized   or larger) ortissue from your vagina. Save any tissue for your caregiver to inspect.   Your bleeding increases.   You have a thick, bad-smelling vaginal discharge.  You become lightheaded, weak, or you faint.   You have chills.  MAKE SURE YOU:  Understand these instructions.  Will watch your condition.  Will get help right away if you are not doing well or get  worse. Document Released: 12/14/2000 Document Revised: 10/15/2012 Document Reviewed: 08/09/2011 [facility] Patient Information 2015 [place], [place]. This information is not intended to replace advice given to you by your health care provider. Make sure you discuss any questions you have with your health care provider.  Ovarian Cyst An ovarian cyst is a fluid-filled sac that forms on an ovary. The ovaries are small organs that produce eggs in women. Various types of cysts can form on the ovaries. Most are not cancerous. Many do not cause problems, and they often go away on their own. Some may cause symptoms and require treatment. Common types of ovarian cysts include:  Functional cysts--These cysts may occur every month during the menstrual cycle. This is normal. The cysts usually go away with the next menstrual cycle if the woman does not get pregnant. Usually, there are no symptoms with a functional cyst.  Endometrioma cysts--These cysts form from the tissue that lines the uterus. They are also called "chocolate cysts" because they become filled with blood that turns brown. This type of cyst can cause pain in the lower abdomen during intercourse and with your menstrual period.  Cystadenoma cysts--This type develops from the cells on the outside of the ovary. These cysts can get very big and cause lower abdomen pain and pain with intercourse. This type of cyst can twist on itself, cut off its blood supply, and cause severe pain. It can also easily rupture and cause a lot of pain.  Dermoid cysts--This type of cyst is sometimes found in both ovaries. These cysts may contain different kinds of body tissue, such as skin, teeth, hair, or cartilage. They usually do not cause symptoms unless they get very big.  Theca lutein cysts--These cysts occur when too much of a certain hormone (human chorionic gonadotropin) is produced and overstimulates the ovaries to produce an egg. This is most common after procedures  used to assist with the conception of a baby (in vitro fertilization). CAUSES   Fertility drugs can cause a condition in which multiple large cysts are formed on the ovaries. This is called ovarian hyperstimulation syndrome.  A condition called polycystic ovary syndrome can cause hormonal imbalances that can lead to nonfunctional ovarian cysts. SIGNS AND SYMPTOMS  Many ovarian cysts do not cause symptoms. If symptoms are present, they may include:  Pelvic pain or pressure.  Pain in the lower abdomen.  Pain during sexual intercourse.  Increasing girth (swelling) of the abdomen.  Abnormal menstrual periods.  Increasing pain with menstrual periods.  Stopping having menstrual periods without being pregnant. DIAGNOSIS  These cysts are commonly found during a routine or annual pelvic exam. Tests may be ordered to find out more about the cyst. These tests may include:  Ultrasound.  X-ray of the pelvis.  CT scan.  MRI.  Blood tests. TREATMENT  Many ovarian cysts go away on their own without treatment. Your health care provider may want to check your cyst regularly for 2-3 months to see if it changes. For women in menopause, it is particularly important to monitor a cyst closely because of the higher rate  of ovarian cancer in menopausal women. When treatment is needed, it may include any of the following:  A procedure to drain the cyst (aspiration). This may be done using a long needle and ultrasound. It can also be done through a laparoscopic procedure. This involves using a thin, lighted tube with a tiny camera on the end (laparoscope) inserted through a small incision.  Surgery to remove the whole cyst. This may be done using laparoscopic surgery or an open surgery involving a larger incision in the lower abdomen.  Hormone treatment or birth control pills. These methods are sometimes used to help dissolve a cyst. HOME CARE INSTRUCTIONS   Only take over-the-counter or  prescription medicines as directed by your health care provider.  Follow up with your health care provider as directed.  Get regular pelvic exams and Pap tests. SEEK MEDICAL CARE IF:   Your periods are late, irregular, or painful, or they stop.  Your pelvic pain or abdominal pain does not go away.  Your abdomen becomes larger or swollen.  You have pressure on your bladder or trouble emptying your bladder completely.  You have pain during sexual intercourse.  You have feelings of fullness, pressure, or discomfort in your stomach.  You lose weight for no apparent reason.  You feel generally ill.  You become constipated.  You lose your appetite.  You develop acne.  You have an increase in body and facial hair.  You are gaining weight, without changing your exercise and eating habits.  You think you are pregnant. SEEK IMMEDIATE MEDICAL CARE IF:   You have increasing abdominal pain.  You feel sick to your stomach (nauseous), and you throw up (vomit).  You develop a fever that comes on suddenly.  You have abdominal pain during a bowel movement.  Your menstrual periods become heavier than usual. MAKE SURE YOU:  Understand these instructions.  Will watch your condition.  Will get help right away if you are not doing well or get worse. Document Released: 06/20/2005 Document Revised: 06/25/2013 Document Reviewed: 02/25/2013 [facility] Patient Information 2015 [place], [place]. This information is not intended to replace advice given to you by your health care provider. Make sure you discuss any questions you have with your health care provider.

## 2014-07-04 NOTE — MAU Provider Note (Signed)
History   Chief Complaint:  No chief complaint on file.   Angelica Blankenship is  24 y.o. G2P0010 Patient's last menstrual period was 06/18/2014.Marland Kitchen Patient is here for follow up of quantitative HCG and ongoing surveillance of pregnancy status.   She is [redacted]w[redacted]d weeks gestation  by LMP.    Since her last visit, the patient is without new complaint.   The patient reports bleeding as  none now.  Denies abd pain  General ROS:  negative  Her previous Quantitative HCG values are:  07/02/14:  188  Physical Exam   Last menstrual period 06/18/2014.  Focused Gynecological Exam: examination not indicated  Labs: Results for orders placed or performed during the hospital encounter of 07/04/14 (from the past 24 hour(s))  hCG, quantitative, pregnancy   Collection Time: 07/04/14  8:20 PM  Result Value Ref Range   hCG, Beta Chain, Quant, S 98 (H) <5 mIU/mL    A pos  Ultrasound Studies:   US Ob Comp Less 14 Wks  07/02/2014   CLINICAL DATA:  Abdominal pain and pregnancy.  EXAM: OBSTETRIC <14 WK [place] AND TRANSVAGINAL OB US  TECHNIQUE: Both transabdominal and transvaginal ultrasound examinations were performed for complete evaluation of the gestation as well as the maternal uterus, adnexal regions, and pelvic cul-de-sac. Transvaginal technique was performed to assess early pregnancy.  COMPARISON:  None of the gestation  FINDINGS: No definite intra or extrauterine gestational sac is identified. The uterus is normal. Abnormal appearance of the left adnexa. At a minimum, there is a 4 cm left adnexal cyst with internal lace-like echoes - most consistent with a hemorrhagic cyst. A neighboring 4 cm solid-appearing structure appears to be the ovarian parenchyma, there is no definitive extra ovarian mass. Small tubular structure in the left adnexa, suspect mild hydrosalpinx. No hemo peritoneum. The right ovary is normal.  These results were called by telephone at the time of interpretation on 07/02/2014 at 9:42 pm to Dr.  Vonzella Nipple , who verbally acknowledged these results.  IMPRESSION: 1. Pregnancy of unknown location. The left adnexa is abnormal, which increases chances of ectopic pregnancy, although a definite ectopic is not identified. Differential considerations include intrauterine gestation too early to be sonographically visualized, spontaneous abortion, or ectopic pregnancy. Close follow up is recommended. 2. 4 cm complicated cyst in the left ovary favoring hemorrhagic cyst. 3. Question mild left hydrosalpinx.   Electronically Signed   By: Tiburcio Pea M.D.   On: 07/02/2014 21:45   US Ob Transvaginal  07/02/2014   CLINICAL DATA:  Abdominal pain and pregnancy.  EXAM: OBSTETRIC <14 WK [place] AND TRANSVAGINAL OB US  TECHNIQUE: Both transabdominal and transvaginal ultrasound examinations were performed for complete evaluation of the gestation as well as the maternal uterus, adnexal regions, and pelvic cul-de-sac. Transvaginal technique was performed to assess early pregnancy.  COMPARISON:  None of the gestation  FINDINGS: No definite intra or extrauterine gestational sac is identified. The uterus is normal. Abnormal appearance of the left adnexa. At a minimum, there is a 4 cm left adnexal cyst with internal lace-like echoes - most consistent with a hemorrhagic cyst. A neighboring 4 cm solid-appearing structure appears to be the ovarian parenchyma, there is no definitive extra ovarian mass. Small tubular structure in the left adnexa, suspect mild hydrosalpinx. No hemo peritoneum. The right ovary is normal.  These results were called by telephone at the time of interpretation on 07/02/2014 at 9:42 pm to Dr. Vonzella Nipple , who verbally acknowledged these results.  IMPRESSION:  1. Pregnancy of unknown location. The left adnexa is abnormal, which increases chances of ectopic pregnancy, although a definite ectopic is not identified. Differential considerations include intrauterine gestation too early to be sonographically  visualized, spontaneous abortion, or ectopic pregnancy. Close follow up is recommended. 2. 4 cm complicated cyst in the left ovary favoring hemorrhagic cyst. 3. Question mild left hydrosalpinx.   Electronically Signed   By: Tiburcio Pea M.D.   On: 07/02/2014 21:45    Assessment: 1. Miscarriage    Plan: D/C home in stable condition. In-basket message sent to [facility] for F/U appts Follow-up Information    Follow up with [facility].   Specialty:  Obstetrics and Gynecology   Why:  weekly bloodwork until HCG levels less than 1. Then follow-up appointment in 6 weeks after repeat ultrasound for ovarian cyst   Contact information:   [address] [place] [address] [phone]      Follow up with [facility] [place].   Why:  routine gynecology care   Contact information:   [address] [place] [address] [phone]      Follow up with [facility] [place] MATERN[facility] ADMISSIONS.   Why:  As needed in emergencies   Contact information:   [address] [id number] mc [place] [address] [phone]      Follow up with [facility] [place] [facility]TR[facility] In 6 weeks.   Specialty:  Radiology   Why:  for repeat ultrasound for ovarian cyst   Contact information:   [address] [id number] mc [place] [address] [phone]        Medication List    TAKE these medications        metroNIDAZOLE 500 MG tablet  Commonly known as:  FLAGYL  Take 500 mg by mouth 2 (two) times daily.         Angelica Blankenship 07/04/2014, 8:30 PM

## 2014-08-12 ENCOUNTER — Other Ambulatory Visit (HOSPITAL_COMMUNITY): Payer: Self-pay | Admitting: Advanced Practice Midwife

## 2014-08-12 DIAGNOSIS — O039 Complete or unspecified spontaneous abortion without complication: Secondary | ICD-10-CM

## 2014-08-13 ENCOUNTER — Other Ambulatory Visit: Payer: 59

## 2014-08-13 ENCOUNTER — Telehealth: Payer: Self-pay | Admitting: *Deleted

## 2014-08-13 ENCOUNTER — Ambulatory Visit (HOSPITAL_COMMUNITY)
Admission: RE | Admit: 2014-08-13 | Discharge: 2014-08-13 | Disposition: A | Discharge status: Home / Self Care | Payer: 59 | Source: Ambulatory Visit | Attending: Advanced Practice Midwife | Admitting: Advanced Practice Midwife

## 2014-08-13 DIAGNOSIS — N7011 Chronic salpingitis: Secondary | ICD-10-CM | POA: Insufficient documentation

## 2014-08-13 DIAGNOSIS — O039 Complete or unspecified spontaneous abortion without complication: Secondary | ICD-10-CM

## 2014-08-13 DIAGNOSIS — N8320 Unspecified ovarian cysts: Secondary | ICD-10-CM | POA: Insufficient documentation

## 2014-08-13 DIAGNOSIS — O034 Incomplete spontaneous abortion without complication: Secondary | ICD-10-CM

## 2014-08-13 DIAGNOSIS — O4690 Antepartum hemorrhage, unspecified, unspecified trimester: Secondary | ICD-10-CM

## 2014-08-13 IMAGING — US US TRANSVAGINAL NON-OB
1 series · 13 of 25 positions shown · non-contrast
Comparison: [DATE]

CLINICAL DATA: Follow-up ovarian lesions

EXAM:
ULTRASOUND PELVIS TRANSVAGINAL
TECHNIQUE: Transvaginal ultrasound examination of the pelvis was performed
including evaluation of the uterus, ovaries, adnexal regions, and
pelvic cul-de-sac.

[Series 1: us transvaginal non-ob · 51 acquisitions, 13 frames shown]
[im 1/51]
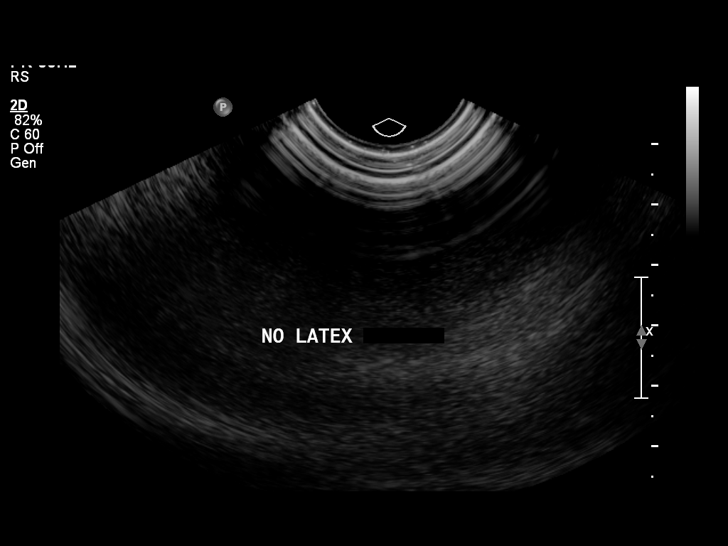
[im 5/51]
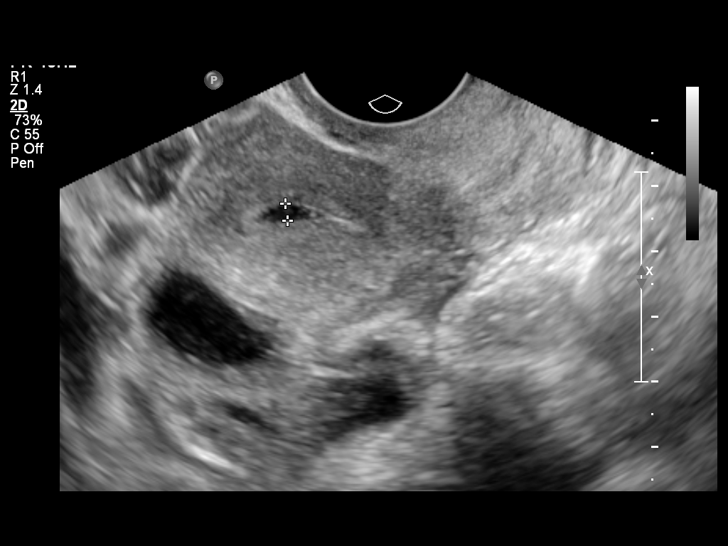
[im 9/51]
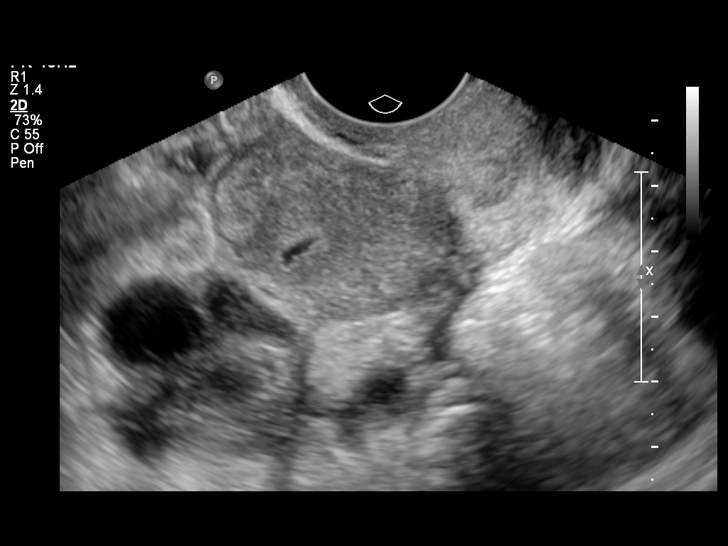
[im 13/51]
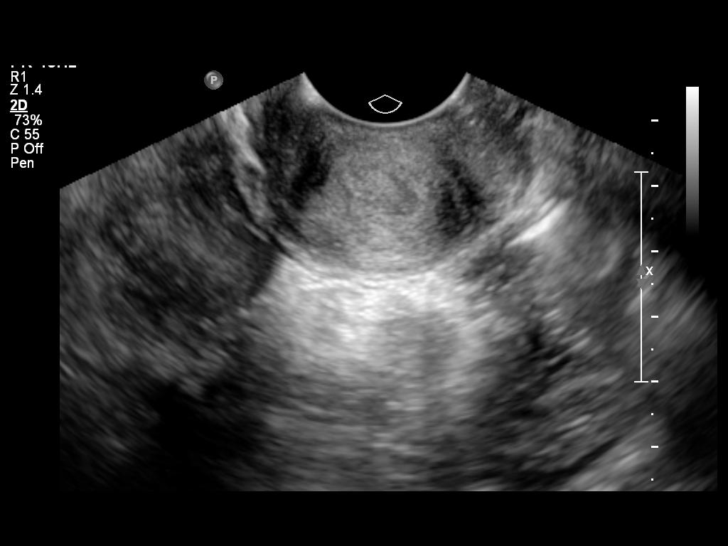
[im 17/51]
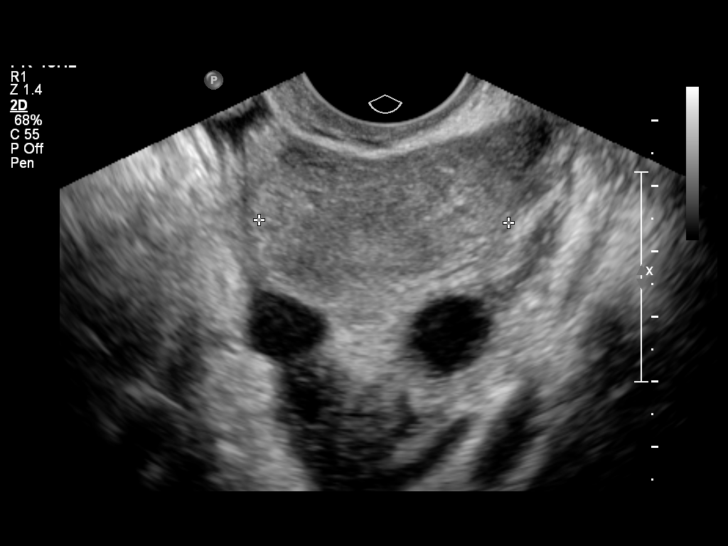
[im 21/51]
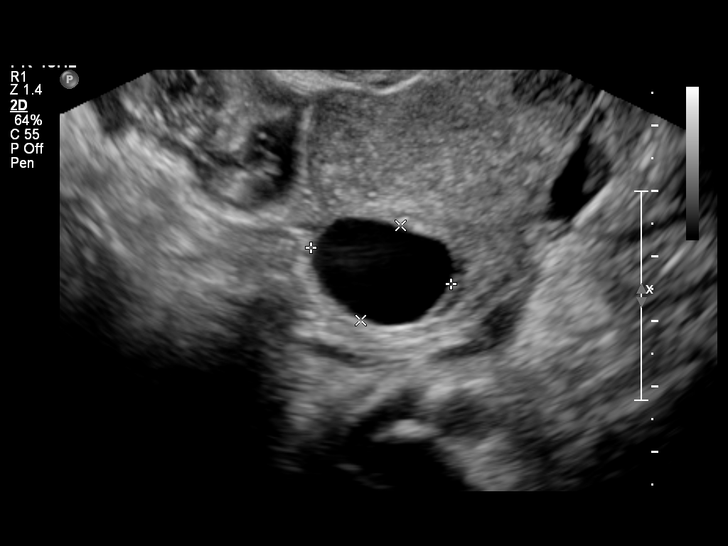
[im 26/51]
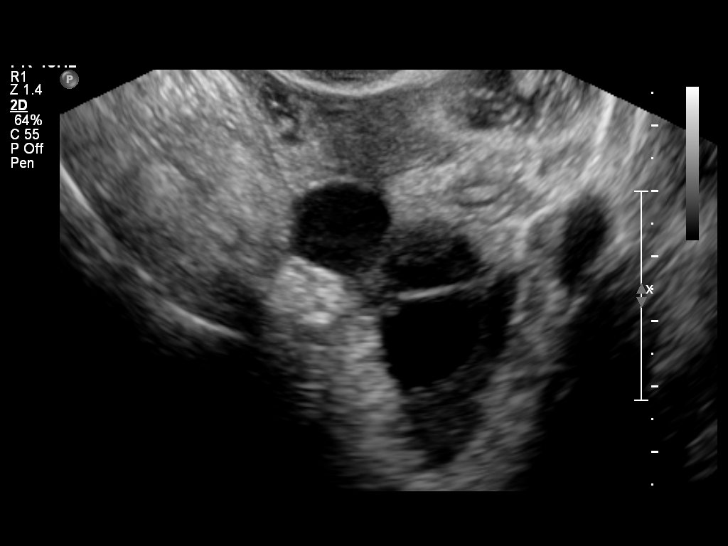
[im 30/51]
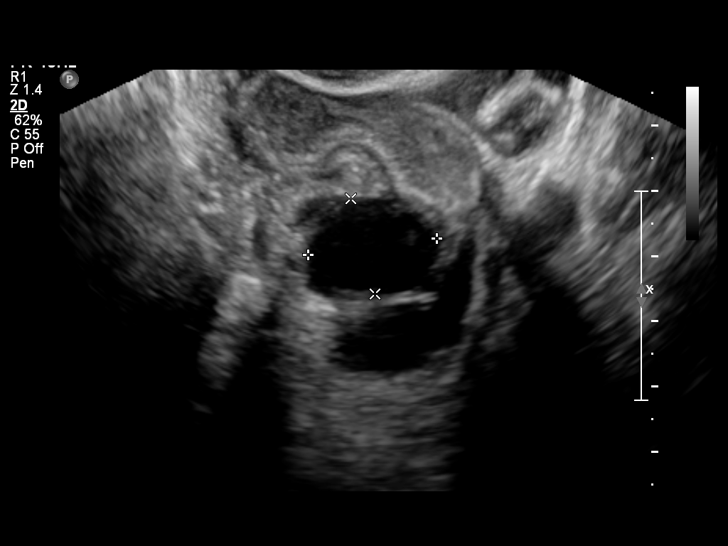
[im 34/51]
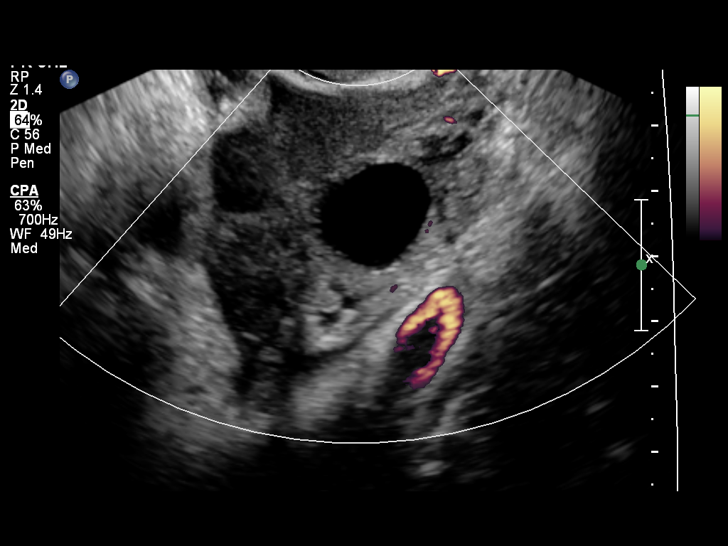
[im 38/51]
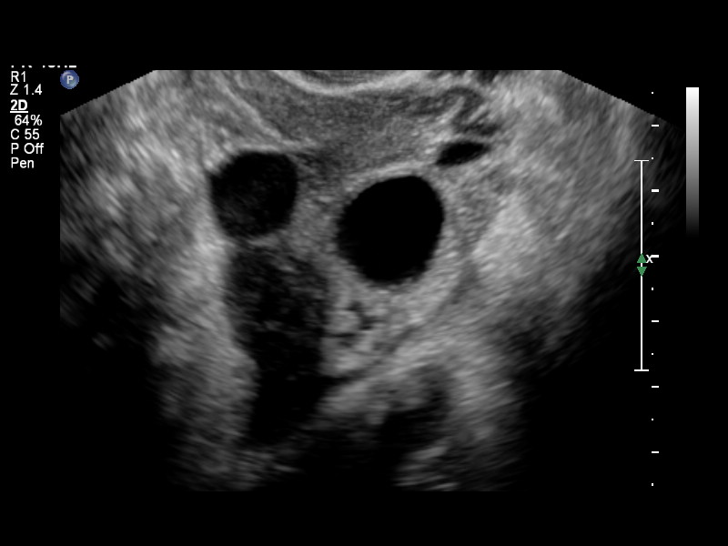
[im 42/51]
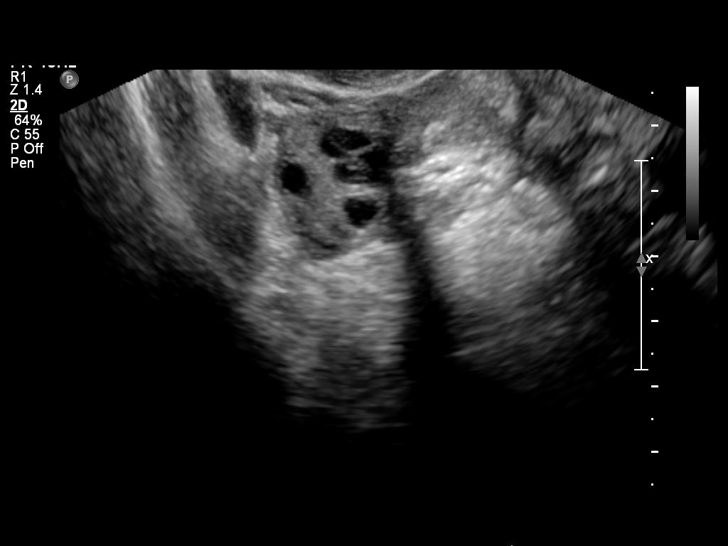
[im 46/51]
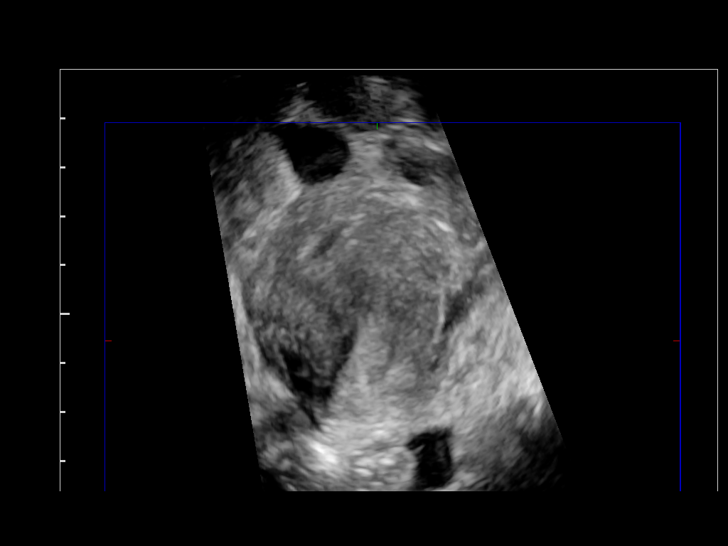
[im 51/51]
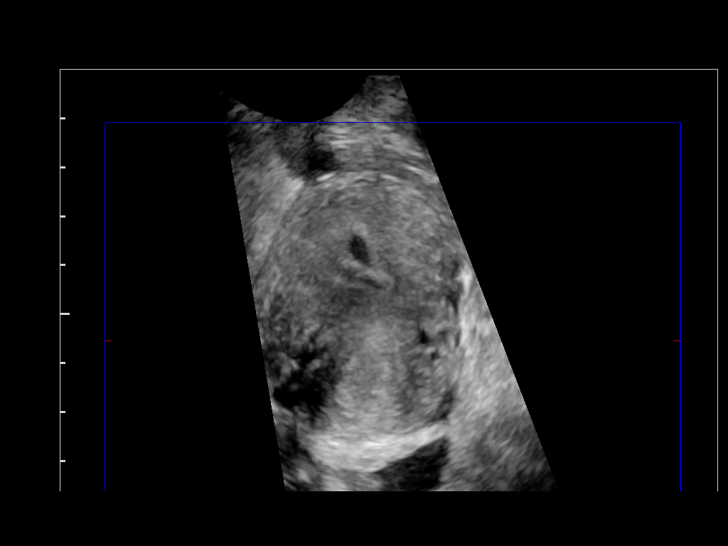

[13 of 25 positions shown; findings below may reference images not displayed]

FINDINGS: Uterus

Measurements: 7.3 x 3.0 x 3.8 cm. No fibroids or other mass
visualized.

Endometrium

Thickness: 3 mm.  Trace fluid in the uterine fundus.

Right ovary

Measurements: 3.3 x 3.4 x 2.0 cm. Normal appearance/no adnexal mass.

Left ovary

Measurements: 4.2 x 2.7 x 2.6 cm. 2.2 x 1.6 x 1.6 cm simple cyst,
previously hemorrhagic, decreased. Adjacent 2.0 x 1.4 x 1.7 cm
echogenic lesion is also decreased, and is favored to be tubal in
location. Associated left hydrosalpinx.

Other findings:  Trace pelvic fluid, simple.
IMPRESSION: 2.2 cm left ovarian simple cyst, decreased.

Adjacent 2.0 cm echogenic lesion is favored to be tubal in location
and may be related to prior ectopic pregnancy. Correlate with beta
HCG.

Associated left hydrosalpinx.  Trace pelvic fluid, simple.

These results will be called to the ordering clinician or
representative by the Radiologist Assistant, and communication
documented in the PACS or zVision Dashboard.

## 2014-08-13 NOTE — Telephone Encounter (Signed)
Received a called report of pt's US from today - final report available in EPIC. Per chart review, pt was supposed to have BHCG levels checked weekly after [facility]U visit on 07/04/14. It appears that no appts were made for pt, therefore last BHCG was done on 07/04/14.  Message sent to Dorathy KinsmanVirginia Smith, CNM for review of US result and follow up plan of care.

## 2014-08-13 NOTE — Telephone Encounter (Signed)
Reviewed with Dr. Jolayne Pantheronstant. Called client and she denies any pain now, states she had a period first few days of February and had pain first 2 days, but none since. Instructed patient to come in for bhcg . She will come in today.

## 2014-08-14 ENCOUNTER — Telehealth: Payer: Self-pay

## 2014-08-14 LAB — HCG, QUANTITATIVE, PREGNANCY: hCG, Beta Chain, Quant, S: <2 m[IU]/mL

## 2014-08-14 NOTE — Telephone Encounter (Signed)
-----   Message from Willodean Rosenthalarolyn Harraway-Smith, MD sent at 08/14/2014  3:37 PM EST ----- Pt does not need repeat HCG.  Thx, clh-S

## 2014-08-14 NOTE — Telephone Encounter (Signed)
Attempted to contact patient. No answer. Left message stating we are calling with results, please call clinic.  

## 2014-08-15 NOTE — Telephone Encounter (Signed)
Curley SpiceShanequa called back, I gave her results of her bhcg and per Dr. Erin FullingHarraway-Smith does not need any more bhcg's. No questions voiced.

## 2014-09-11 ENCOUNTER — Ambulatory Visit: Payer: 59 | Admitting: Obstetrics & Gynecology

## 2014-09-12 ENCOUNTER — Encounter: Payer: Self-pay | Admitting: Obstetrics & Gynecology

## 2014-09-12 ENCOUNTER — Ambulatory Visit (INDEPENDENT_AMBULATORY_CARE_PROVIDER_SITE_OTHER): Payer: 59 | Admitting: Obstetrics & Gynecology

## 2014-09-12 VITALS — Ht 65.0 in | Wt 153.0 lb

## 2014-09-12 DIAGNOSIS — O039 Complete or unspecified spontaneous abortion without complication: Secondary | ICD-10-CM

## 2014-09-12 NOTE — Patient Instructions (Signed)
Contraception Choices Contraception (birth control) is the use of any methods or devices to prevent pregnancy. Below are some methods to help avoid pregnancy. HORMONAL METHODS   Contraceptive implant. This is a thin, plastic tube containing progesterone hormone. It does not contain estrogen hormone. Your health care provider inserts the tube in the inner part of the upper arm. The tube can remain in place for up to 3 years. After 3 years, the implant must be removed. The implant prevents the ovaries from releasing an egg (ovulation), thickens the cervical mucus to prevent sperm from entering the uterus, and thins the lining of the inside of the uterus.  Progesterone-only injections. These injections are given every 3 months by your health care provider to prevent pregnancy. This synthetic progesterone hormone stops the ovaries from releasing eggs. It also thickens cervical mucus and changes the uterine lining. This makes it harder for sperm to survive in the uterus.  Birth control pills. These pills contain estrogen and progesterone hormone. They work by preventing the ovaries from releasing eggs (ovulation). They also cause the cervical mucus to thicken, preventing the sperm from entering the uterus. Birth control pills are prescribed by a health care provider.Birth control pills can also be used to treat heavy periods.  Minipill. This type of birth control pill contains only the progesterone hormone. They are taken every day of each month and must be prescribed by your health care provider.  Birth control patch. The patch contains hormones similar to those in birth control pills. It must be changed once a week and is prescribed by a health care provider.  Vaginal ring. The ring contains hormones similar to those in birth control pills. It is left in the vagina for 3 weeks, removed for 1 week, and then a new one is put back in place. The patient must be comfortable inserting and removing the ring  from the vagina.A health care provider's prescription is necessary.  Emergency contraception. Emergency contraceptives prevent pregnancy after unprotected sexual intercourse. This pill can be taken right after sex or up to 5 days after unprotected sex. It is most effective the sooner you take the pills after having sexual intercourse. Most emergency contraceptive pills are available without a prescription. Check with your pharmacist. Do not use emergency contraception as your only form of birth control. BARRIER METHODS   Female condom. This is a thin sheath (latex or rubber) that is worn over the penis during sexual intercourse. It can be used with spermicide to increase effectiveness.  Female condom. This is a soft, loose-fitting sheath that is put into the vagina before sexual intercourse.  Diaphragm. This is a soft, latex, dome-shaped barrier that must be fitted by a health care provider. It is inserted into the vagina, along with a spermicidal jelly. It is inserted before intercourse. The diaphragm should be left in the vagina for 6 to 8 hours after intercourse.  Cervical cap. This is a round, soft, latex or plastic cup that fits over the cervix and must be fitted by a health care provider. The cap can be left in place for up to 48 hours after intercourse.  Sponge. This is a soft, circular piece of polyurethane foam. The sponge has spermicide in it. It is inserted into the vagina after wetting it and before sexual intercourse.  Spermicides. These are chemicals that kill or block sperm from entering the cervix and uterus. They come in the form of creams, jellies, suppositories, foam, or tablets. They do not require a   prescription. They are inserted into the vagina with an applicator before having sexual intercourse. The process must be repeated every time you have sexual intercourse. INTRAUTERINE CONTRACEPTION  Intrauterine device (IUD). This is a T-shaped device that is put in a woman's uterus  during a menstrual period to prevent pregnancy. There are 2 types:  Copper IUD. This type of IUD is wrapped in copper wire and is placed inside the uterus. Copper makes the uterus and fallopian tubes produce a fluid that kills sperm. It can stay in place for 10 years.  Hormone IUD. This type of IUD contains the hormone progestin (synthetic progesterone). The hormone thickens the cervical mucus and prevents sperm from entering the uterus, and it also thins the uterine lining to prevent implantation of a fertilized egg. The hormone can weaken or kill the sperm that get into the uterus. It can stay in place for 3-5 years, depending on which type of IUD is used. PERMANENT METHODS OF CONTRACEPTION  Female tubal ligation. This is when the woman's fallopian tubes are surgically sealed, tied, or blocked to prevent the egg from traveling to the uterus.  Hysteroscopic sterilization. This involves placing a small coil or insert into each fallopian tube. Your doctor uses a technique called hysteroscopy to do the procedure. The device causes scar tissue to form. This results in permanent blockage of the fallopian tubes, so the sperm cannot fertilize the egg. It takes about 3 months after the procedure for the tubes to become blocked. You must use another form of birth control for these 3 months.  Female sterilization. This is when the female has the tubes that carry sperm tied off (vasectomy).This blocks sperm from entering the vagina during sexual intercourse. After the procedure, the man can still ejaculate fluid (semen). NATURAL PLANNING METHODS  Natural family planning. This is not having sexual intercourse or using a barrier method (condom, diaphragm, cervical cap) on days the woman could become pregnant.  Calendar method. This is keeping track of the length of each menstrual cycle and identifying when you are fertile.  Ovulation method. This is avoiding sexual intercourse during ovulation.  Symptothermal  method. This is avoiding sexual intercourse during ovulation, using a thermometer and ovulation symptoms.  Post-ovulation method. This is timing sexual intercourse after you have ovulated. Regardless of which type or method of contraception you choose, it is important that you use condoms to protect against the transmission of sexually transmitted infections (STIs). Talk with your health care provider about which form of contraception is most appropriate for you. Document Released: 06/20/2005 Document Revised: 06/25/2013 Document Reviewed: 12/13/2012 Exit[employer] Patient Information 2015 [employer]. This information is not intended to replace advice given to you by your health care provider. Make sure you discuss any questions you have with your health care provider.  

## 2014-09-12 NOTE — Progress Notes (Signed)
Patient ID: Angelica Blankenship, female   DOB: 08/10/1990, 24 y.o.   MRN: [id number]  Chief Complaint  Patient presents with  . Miscarriage    HPI Angelica Blankenship is a 24 y.o. female.  Patient's last menstrual period was 06/18/2014. [id number] F/u after early miscarriage, had negative HCG 08/13/14  HPI  Past Medical History  Diagnosis Date  . Heart murmur     Past Surgical History  Procedure Laterality Date  . No past surgeries      History reviewed. No pertinent family history.  Social History History  Substance Use Topics  . Smoking status: Current Every Day Smoker -- 1.00 packs/day    Types: Pipe  . Smokeless tobacco: Not on file  . Alcohol Use: Yes     Comment: occasional    No Known Allergies  Current Outpatient Prescriptions  Medication Sig Dispense Refill  . metroNIDAZOLE (FLAGYL) 500 MG tablet Take 500 mg by mouth 2 (two) times daily.     No current facility-administered medications for this visit.    Review of Systems Review of Systems  Constitutional: Negative.   Gastrointestinal: Negative.   Genitourinary: Negative.     Height 5\' 5"  (1.651 m), weight 153 lb (69.4 kg), last menstrual period 06/18/2014, unknown if currently breastfeeding.  Physical Exam Physical Exam  Constitutional: She is oriented to person, place, and time. She appears well-developed and well-nourished. No distress.  Pulmonary/Chest: Effort normal.  Neurological: She is alert and oriented to person, place, and time.  Skin: Skin is warm and dry.  Psychiatric: She has a normal mood and affect. Her behavior is normal.    Data Reviewed MAU notes  Assessment    S/p complete miscarriage     Plan    Declines BCM, advised to use condoms and spermacide        ARNOLD,JAMES 09/12/2014, 9:32 AM

## 2015-06-05 ENCOUNTER — Other Ambulatory Visit: Payer: Self-pay | Admitting: Obstetrics and Gynecology

## 2015-06-17 ENCOUNTER — Encounter (HOSPITAL_COMMUNITY)
Admission: RE | Admit: 2015-06-17 | Discharge: 2015-06-17 | Disposition: A | Discharge status: Home / Self Care | Payer: 59 | Source: Ambulatory Visit | Attending: Obstetrics and Gynecology | Admitting: Obstetrics and Gynecology

## 2015-06-17 ENCOUNTER — Encounter (HOSPITAL_COMMUNITY): Payer: Self-pay

## 2015-06-17 DIAGNOSIS — F17200 Nicotine dependence, unspecified, uncomplicated: Secondary | ICD-10-CM | POA: Diagnosis not present

## 2015-06-17 DIAGNOSIS — N803 Endometriosis of pelvic peritoneum: Secondary | ICD-10-CM | POA: Diagnosis not present

## 2015-06-17 DIAGNOSIS — R1032 Left lower quadrant pain: Secondary | ICD-10-CM | POA: Diagnosis present

## 2015-06-17 HISTORY — DX: Headache, unspecified: R51.9

## 2015-06-17 HISTORY — DX: Headache: R51

## 2015-06-17 LAB — CBC
HCT: 33 % — ABNORMAL LOW (ref 36.0–46.0)
Hemoglobin: 11.4 g/dL — ABNORMAL LOW (ref 12.0–15.0)
MCH: 32.1 pg (ref 26.0–34.0)
MCHC: 34.5 g/dL (ref 30.0–36.0)
MCV: 93 fL (ref 78.0–100.0)
Platelets: 218 10*3/uL (ref 150–400)
RBC: 3.55 MIL/uL — ABNORMAL LOW (ref 3.87–5.11)
RDW: 13.9 % (ref 11.5–15.5)
WBC: 5.9 10*3/uL (ref 4.0–10.5)

## 2015-06-17 NOTE — Patient Instructions (Signed)
Your procedure is scheduled on:06/18/15  Enter through the Main Entrance at :12:15 pm Pick up desk phone and dial [phone] and inform us of your arrival.  Please call [phone] if you have any problems the morning of surgery.  Remember: Do not eat food after midnight:tonight Clear liquids are ok until: 9:30 am Thursday  You may brush your teeth the morning of surgery.   DO NOT wear jewelry, eye make-up, lipstick,body lotion, or dark fingernail polish.  (Polished toes are ok) You may wear deodorant.  If you are to be admitted after surgery, leave suitcase in car until your room has been assigned. Patients discharged on the day of surgery will not be allowed to drive home. Wear loose fitting, comfortable clothes for your ride home.

## 2015-06-18 ENCOUNTER — Encounter (HOSPITAL_COMMUNITY): Payer: Self-pay | Admitting: Anesthesiology

## 2015-06-18 ENCOUNTER — Ambulatory Visit (HOSPITAL_COMMUNITY): Payer: 59 | Admitting: Anesthesiology

## 2015-06-18 ENCOUNTER — Ambulatory Visit (HOSPITAL_COMMUNITY)
Admission: RE | Admit: 2015-06-18 | Discharge: 2015-06-18 | Disposition: A | Discharge status: Home / Self Care | Payer: 59 | Source: Ambulatory Visit | Attending: Obstetrics and Gynecology | Admitting: Obstetrics and Gynecology

## 2015-06-18 ENCOUNTER — Encounter (HOSPITAL_COMMUNITY)
Admission: RE | Disposition: A | Discharge status: Home / Self Care | Payer: Self-pay | Source: Ambulatory Visit | Attending: Obstetrics and Gynecology

## 2015-06-18 DIAGNOSIS — R1032 Left lower quadrant pain: Secondary | ICD-10-CM | POA: Insufficient documentation

## 2015-06-18 DIAGNOSIS — F17200 Nicotine dependence, unspecified, uncomplicated: Secondary | ICD-10-CM | POA: Insufficient documentation

## 2015-06-18 DIAGNOSIS — N803 Endometriosis of pelvic peritoneum, unspecified: Secondary | ICD-10-CM

## 2015-06-18 HISTORY — PX: LAPAROSCOPY: SHX197

## 2015-06-18 HISTORY — PX: ROBOTIC ASSISTED DIAGNOSTIC LAPAROSCOPY: SHX6532

## 2015-06-18 LAB — PREGNANCY, URINE: Preg Test, Ur: NEGATIVE

## 2015-06-18 SURGERY — LAPAROSCOPY, DIAGNOSTIC
Anesthesia: General | Site: Abdomen

## 2015-06-18 MED ORDER — DEXTROSE 5 % IV SOLN
100.0000 mg | Freq: Once | INTRAVENOUS | Status: AC
  Administered 2015-06-18: 100 mg via INTRAVENOUS
  Filled 2015-06-18: qty 100

## 2015-06-18 MED ORDER — SODIUM CHLORIDE 0.9 % IJ SOLN
INTRAMUSCULAR | Status: AC
  Filled 2015-06-18: qty 50

## 2015-06-18 MED ORDER — FENTANYL CITRATE (PF) 100 MCG/2ML IJ SOLN: 25.0000 ug | INTRAMUSCULAR | Status: DC | PRN

## 2015-06-18 MED ORDER — DOXYCYCLINE HYCLATE 50 MG PO CAPS: 100.0000 mg | ORAL_CAPSULE | Freq: Two times a day (BID) | ORAL | Status: DC

## 2015-06-18 MED ORDER — SODIUM CHLORIDE 0.9 % IR SOLN
Status: DC | PRN
  Administered 2015-06-18 (×2): 3000 mL

## 2015-06-18 MED ORDER — HYDROCODONE-ACETAMINOPHEN 7.5-325 MG PO TABS: 1.0000 | ORAL_TABLET | Freq: Once | ORAL | Status: DC | PRN

## 2015-06-18 MED ORDER — SCOPOLAMINE 1 MG/3DAYS TD PT72
MEDICATED_PATCH | TRANSDERMAL | Status: AC
  Administered 2015-06-18: 1.5 mg via TRANSDERMAL
  Filled 2015-06-18: qty 1

## 2015-06-18 MED ORDER — SUFENTANIL CITRATE 50 MCG/ML IV SOLN: INTRAVENOUS | Status: DC | PRN

## 2015-06-18 MED ORDER — ONDANSETRON HCL 4 MG/2ML IJ SOLN
INTRAMUSCULAR | Status: DC | PRN
  Administered 2015-06-18: 4 mg via INTRAVENOUS

## 2015-06-18 MED ORDER — HEPARIN SODIUM (PORCINE) 5000 UNIT/ML IJ SOLN
INTRAMUSCULAR | Status: AC
  Filled 2015-06-18: qty 1

## 2015-06-18 MED ORDER — FENTANYL CITRATE (PF) 100 MCG/2ML IJ SOLN
INTRAMUSCULAR | Status: DC | PRN
  Administered 2015-06-18 (×2): 100 ug via INTRAVENOUS
  Administered 2015-06-18: 50 ug via INTRAVENOUS
  Administered 2015-06-18: 100 ug via INTRAVENOUS
  Administered 2015-06-18 (×2): 50 ug via INTRAVENOUS

## 2015-06-18 MED ORDER — BUPIVACAINE HCL (PF) 0.25 % IJ SOLN
INTRAMUSCULAR | Status: AC
  Filled 2015-06-18: qty 60

## 2015-06-18 MED ORDER — ESMOLOL HCL 100 MG/10ML IV SOLN
INTRAVENOUS | Status: DC | PRN
  Administered 2015-06-18 (×2): 5 mg via INTRAVENOUS

## 2015-06-18 MED ORDER — SODIUM CHLORIDE 0.9 % IV SOLN
INTRAVENOUS | Status: DC | PRN
  Administered 2015-06-18: 60 mL

## 2015-06-18 MED ORDER — FENTANYL CITRATE (PF) 100 MCG/2ML IJ SOLN
INTRAMUSCULAR | Status: AC
  Filled 2015-06-18: qty 2

## 2015-06-18 MED ORDER — HYDROCODONE-ACETAMINOPHEN 5-300 MG PO TABS: 1.0000 | ORAL_TABLET | Freq: Four times a day (QID) | ORAL | Status: DC | PRN

## 2015-06-18 MED ORDER — GLYCOPYRROLATE 0.2 MG/ML IJ SOLN
INTRAMUSCULAR | Status: AC
  Filled 2015-06-18: qty 3

## 2015-06-18 MED ORDER — MIDAZOLAM HCL 2 MG/2ML IJ SOLN
INTRAMUSCULAR | Status: AC
  Filled 2015-06-18: qty 2

## 2015-06-18 MED ORDER — NEOSTIGMINE METHYLSULFATE 10 MG/10ML IV SOLN
INTRAVENOUS | Status: DC | PRN
  Administered 2015-06-18: 4 mg via INTRAVENOUS

## 2015-06-18 MED ORDER — BUPIVACAINE HCL (PF) 0.25 % IJ SOLN
INTRAMUSCULAR | Status: DC | PRN
  Administered 2015-06-18: 13 mL

## 2015-06-18 MED ORDER — LIDOCAINE HCL (CARDIAC) 20 MG/ML IV SOLN
INTRAVENOUS | Status: DC | PRN
  Administered 2015-06-18: 80 mg via INTRAVENOUS

## 2015-06-18 MED ORDER — METOCLOPRAMIDE HCL 5 MG/ML IJ SOLN: 10.0000 mg | Freq: Once | INTRAMUSCULAR | Status: DC | PRN

## 2015-06-18 MED ORDER — GLYCOPYRROLATE 0.2 MG/ML IJ SOLN
INTRAMUSCULAR | Status: DC | PRN
  Administered 2015-06-18: 0.6 mg via INTRAVENOUS

## 2015-06-18 MED ORDER — MIDAZOLAM HCL 2 MG/2ML IJ SOLN
INTRAMUSCULAR | Status: DC | PRN
  Administered 2015-06-18: 2 mg via INTRAVENOUS

## 2015-06-18 MED ORDER — FENTANYL CITRATE (PF) 250 MCG/5ML IJ SOLN
INTRAMUSCULAR | Status: AC
  Filled 2015-06-18: qty 5

## 2015-06-18 MED ORDER — ONDANSETRON HCL 4 MG/2ML IJ SOLN
INTRAMUSCULAR | Status: AC
  Filled 2015-06-18: qty 2

## 2015-06-18 MED ORDER — PROPOFOL 10 MG/ML IV BOLUS
INTRAVENOUS | Status: AC
  Filled 2015-06-18: qty 20

## 2015-06-18 MED ORDER — 0.9 % SODIUM CHLORIDE (POUR BTL) OPTIME
TOPICAL | Status: DC | PRN
  Administered 2015-06-18: 400 mL

## 2015-06-18 MED ORDER — DEXAMETHASONE SODIUM PHOSPHATE 10 MG/ML IJ SOLN
INTRAMUSCULAR | Status: DC | PRN
  Administered 2015-06-18: 4 mg via INTRAVENOUS

## 2015-06-18 MED ORDER — NEOSTIGMINE METHYLSULFATE 10 MG/10ML IV SOLN
INTRAVENOUS | Status: AC
  Filled 2015-06-18: qty 1

## 2015-06-18 MED ORDER — LACTATED RINGERS IV SOLN
INTRAVENOUS | Status: DC
  Administered 2015-06-18 (×3): via INTRAVENOUS

## 2015-06-18 MED ORDER — LIDOCAINE HCL (CARDIAC) 20 MG/ML IV SOLN
INTRAVENOUS | Status: AC
  Filled 2015-06-18: qty 5

## 2015-06-18 MED ORDER — ROCURONIUM BROMIDE 100 MG/10ML IV SOLN
INTRAVENOUS | Status: DC | PRN
  Administered 2015-06-18: 20 mg via INTRAVENOUS
  Administered 2015-06-18: 30 mg via INTRAVENOUS

## 2015-06-18 MED ORDER — SCOPOLAMINE 1 MG/3DAYS TD PT72
1.0000 | MEDICATED_PATCH | Freq: Once | TRANSDERMAL | Status: DC
  Administered 2015-06-18: 1.5 mg via TRANSDERMAL

## 2015-06-18 MED ORDER — IBUPROFEN 800 MG PO TABS: 800.0000 mg | ORAL_TABLET | Freq: Three times a day (TID) | ORAL | Status: DC | PRN

## 2015-06-18 MED ORDER — ROCURONIUM BROMIDE 100 MG/10ML IV SOLN
INTRAVENOUS | Status: AC
  Filled 2015-06-18: qty 1

## 2015-06-18 MED ORDER — MEPERIDINE HCL 25 MG/ML IJ SOLN: 6.2500 mg | INTRAMUSCULAR | Status: DC | PRN

## 2015-06-18 MED ORDER — KETOROLAC TROMETHAMINE 30 MG/ML IJ SOLN
INTRAMUSCULAR | Status: DC | PRN
  Administered 2015-06-18: 30 mg via INTRAMUSCULAR
  Administered 2015-06-18: 30 mg via INTRAVENOUS

## 2015-06-18 MED ORDER — ROPIVACAINE HCL 5 MG/ML IJ SOLN
INTRAMUSCULAR | Status: AC
  Filled 2015-06-18: qty 30

## 2015-06-18 MED ORDER — PROPOFOL 10 MG/ML IV BOLUS
INTRAVENOUS | Status: DC | PRN
  Administered 2015-06-18: 200 mg via INTRAVENOUS

## 2015-06-18 SURGICAL SUPPLY — 61 items
APPLICATOR COTTON TIP 6IN STRL (MISCELLANEOUS) ×2 IMPLANT
BARRIER ADHS 3X4 INTERCEED (GAUZE/BANDAGES/DRESSINGS) ×2 IMPLANT
CABLE HIGH FREQUENCY MONO STRZ (ELECTRODE) IMPLANT
CATH ROBINSON RED A/P 16FR (CATHETERS) ×2 IMPLANT
CHLORAPREP W/TINT 26ML (MISCELLANEOUS) ×2 IMPLANT
CLOTH BEACON ORANGE TIMEOUT ST (SAFETY) ×2 IMPLANT
CONT PATH 16OZ SNAP LID 3702 (MISCELLANEOUS) ×2 IMPLANT
COVER BACK TABLE 60X90IN (DRAPES) ×4 IMPLANT
COVER TIP SHEARS 8 DVNC (MISCELLANEOUS) ×1 IMPLANT
COVER TIP SHEARS 8MM DA VINCI (MISCELLANEOUS) ×1
DECANTER SPIKE VIAL GLASS SM (MISCELLANEOUS) ×2 IMPLANT
DEFOGGER SCOPE WARMER CLEARIFY (MISCELLANEOUS) ×4 IMPLANT
DRSG COVADERM PLUS 2X2 (GAUZE/BANDAGES/DRESSINGS) ×8 IMPLANT
DRSG OPSITE POSTOP 3X4 (GAUZE/BANDAGES/DRESSINGS) ×2 IMPLANT
ELECT LIGASURE LONG (ELECTRODE) IMPLANT
ELECT REM PT RETURN 9FT ADLT (ELECTROSURGICAL) ×2
ELECTRODE REM PT RTRN 9FT ADLT (ELECTROSURGICAL) ×1 IMPLANT
FORCEPS CUTTING 33CM 5MM (CUTTING FORCEPS) IMPLANT
FORCEPS CUTTING 45CM 5MM (CUTTING FORCEPS) IMPLANT
GAUZE VASELINE 3X9 (GAUZE/BANDAGES/DRESSINGS) IMPLANT
GLOVE BIOGEL PI IND STRL 7.0 (GLOVE) ×3 IMPLANT
GLOVE BIOGEL PI INDICATOR 7.0 (GLOVE) ×3
GLOVE ECLIPSE 6.5 STRL STRAW (GLOVE) ×2 IMPLANT
GOWN STRL REUS W/TWL LRG LVL3 (GOWN DISPOSABLE) ×6 IMPLANT
KIT ACCESSORY DA VINCI DISP (KITS) ×1
KIT ACCESSORY DVNC DISP (KITS) ×1 IMPLANT
LEGGING LITHOTOMY PAIR STRL (DRAPES) ×2 IMPLANT
LIQUID BAND (GAUZE/BANDAGES/DRESSINGS) ×2 IMPLANT
NEEDLE INSUFFLATION 120MM (ENDOMECHANICALS) ×2 IMPLANT
NS IRRIG 1000ML POUR BTL (IV SOLUTION) ×6 IMPLANT
PACK LAPAROSCOPY BASIN (CUSTOM PROCEDURE TRAY) ×2 IMPLANT
PACK ROBOT WH (CUSTOM PROCEDURE TRAY) ×2 IMPLANT
PACK ROBOTIC GOWN (GOWN DISPOSABLE) ×2 IMPLANT
PAD POSITIONING PINK XL (MISCELLANEOUS) ×2 IMPLANT
PAD PREP 24X48 CUFFED NSTRL (MISCELLANEOUS) ×4 IMPLANT
SCISSORS LAP 5X35 DISP (ENDOMECHANICALS) IMPLANT
SET CYSTO W/LG BORE CLAMP LF (SET/KITS/TRAYS/PACK) IMPLANT
SET IRRIG TUBING LAPAROSCOPIC (IRRIGATION / IRRIGATOR) ×2 IMPLANT
SET TRI-LUMEN FLTR TB AIRSEAL (TUBING) ×2 IMPLANT
SLEEVE XCEL OPT CAN 5 100 (ENDOMECHANICALS) ×2 IMPLANT
SOLUTION ELECTROLUBE (MISCELLANEOUS) IMPLANT
SUT VICRYL 0 UR6 27IN ABS (SUTURE) ×2 IMPLANT
SUT VICRYL 4-0 PS2 18IN ABS (SUTURE) ×4 IMPLANT
SYR 50ML LL SCALE MARK (SYRINGE) ×2 IMPLANT
SYSTEM CONVERTIBLE TROCAR (TROCAR) IMPLANT
TIP UTERINE 5.1X6CM LAV DISP (MISCELLANEOUS) IMPLANT
TIP UTERINE 6.7X10CM GRN DISP (MISCELLANEOUS) IMPLANT
TIP UTERINE 6.7X6CM WHT DISP (MISCELLANEOUS) IMPLANT
TIP UTERINE 6.7X8CM BLUE DISP (MISCELLANEOUS) IMPLANT
TOWEL OR 17X24 6PK STRL BLUE (TOWEL DISPOSABLE) ×6 IMPLANT
TRAY FOLEY BAG SILVER LF 16FR (SET/KITS/TRAYS/PACK) ×2 IMPLANT
TROCAR BALLN 12MMX100 BLUNT (TROCAR) IMPLANT
TROCAR DISP BLADELESS 8 DVNC (TROCAR) ×1 IMPLANT
TROCAR DISP BLADELESS 8MM (TROCAR) ×1
TROCAR OPTI TIP 12M 100M (ENDOMECHANICALS) ×2 IMPLANT
TROCAR OPTI TIP 5M 100M (ENDOMECHANICALS) ×2 IMPLANT
TROCAR PORT AIRSEAL 5X120 (TROCAR) ×2 IMPLANT
TROCAR XCEL DIL TIP R 11M (ENDOMECHANICALS) ×2 IMPLANT
TROCAR Z-THREAD 12X150 (TROCAR) ×2 IMPLANT
WARMER LAPAROSCOPE (MISCELLANEOUS) ×2 IMPLANT
WATER STERILE IRR 1000ML POUR (IV SOLUTION) ×2 IMPLANT

## 2015-06-18 NOTE — Discharge Instructions (Signed)
DISCHARGE INSTRUCTIONS: Laparoscopy  The following instructions have been prepared to help you care for yourself upon your return home today.  Wound care:  Do not get the incision wet for the first 24 hours. The incision should be kept clean and dry.  The Band-Aids or dressings may be removed the day after surgery.  Should the incision become sore, red, and swollen after the first week, check with your doctor.  Personal hygiene:  Shower the day after your procedure.  Activity and limitations:  Do NOT drive or operate any equipment today.  Do NOT lift anything more than 15 pounds for 2-3 weeks after surgery.  Do NOT rest in bed all day.  Walking is encouraged. Walk each day, starting slowly with 5-minute walks 3 or 4 times a day. Slowly increase the length of your walks.  Walk up and down stairs slowly.  Do NOT do strenuous activities, such as golfing, playing tennis, bowling, running, biking, weight lifting, gardening, mowing, or vacuuming for 2-4 weeks. Ask your doctor when it is okay to start.  Diet: Eat a light meal as desired this evening. You may resume your usual diet tomorrow.  Return to work: This is dependent on the type of work you do. For the most part you can return to a desk job within a week of surgery. If you are more active at work, please discuss this with your doctor.  What to expect after your surgery: You may have a slight burning sensation when you urinate on the first day. You may have a very small amount of blood in the urine. Expect to have a small amount of vaginal discharge/light bleeding for 1-2 weeks. It is not unusual to have abdominal soreness and bruising for up to 2 weeks. You may be tired and need more rest for about 1 week. You may experience shoulder pain for 24-72 hours. Lying flat in bed may relieve it.  Call your doctor for any of the following:  Develop a fever of 100.4 or greater  Inability to urinate 6 hours after discharge from hospital  Severe  pain not relieved by pain medications  Persistent of heavy bleeding at incision site  Redness or swelling around incision site after a week  Increasing nausea or vomiting  Patient Signature________________________________________ Nurse Signature_________________________________________ Post Anesthesia Home Care Instructions  Activity: Get plenty of rest for the remainder of the day. A responsible adult should stay with you for 24 hours following the procedure.  For the next 24 hours, DO NOT: -Drive a car -Operate machinery -Drink alcoholic beverages -Take any medication unless instructed by your physician -Make any legal decisions or sign important papers.  Meals: Start with liquid foods such as gelatin or soup. Progress to regular foods as tolerated. Avoid greasy, spicy, heavy foods. If nausea and/or vomiting occur, drink only clear liquids until the nausea and/or vomiting subsides. Call your physician if vomiting continues.  Special Instructions/Symptoms: Your throat may feel dry or sore from the anesthesia or the breathing tube placed in your throat during surgery. If this causes discomfort, gargle with warm salt water. The discomfort should disappear within 24 hours.  If you had a scopolamine patch placed behind your ear for the management of post- operative nausea and/or vomiting:  1. The medication in the patch is effective for 72 hours, after which it should be removed.  Wrap patch in a tissue and discard in the trash. Wash hands thoroughly with soap and water. 2. You may remove the patch   earlier than 72 hours if you experience unpleasant side effects which may include dry mouth, dizziness or visual disturbances. 3. Avoid touching the patch. Wash your hands with soap and water after contact with the patch.   

## 2015-06-18 NOTE — Anesthesia Preprocedure Evaluation (Addendum)
Anesthesia Evaluation  Patient identified by MRN, date of birth, ID band Patient awake    Reviewed: Allergy & Precautions, NPO status , Patient's Chart, lab work & pertinent test results  Airway Mallampati: II  TM Distance: >3 FB Neck ROM: Full    Dental no notable dental hx. (+) Poor Dentition   Pulmonary Current Smoker,    Pulmonary exam normal breath sounds clear to auscultation       Cardiovascular negative cardio ROS Normal cardiovascular exam+ Valvular Problems/Murmurs  Rhythm:Regular Rate:Normal     Neuro/Psych  Headaches, negative psych ROS   GI/Hepatic negative GI ROS, Neg liver ROS,   Endo/Other  negative endocrine ROS  Renal/GU negative Renal ROS  negative genitourinary   Musculoskeletal   Abdominal   Peds  Hematology negative hematology ROS (+)   Anesthesia Other Findings   Reproductive/Obstetrics LLQ pain Suspected endometriosis                            Anesthesia Physical Anesthesia Plan  ASA: II  Anesthesia Plan: General   Post-op Pain Management:    Induction: Intravenous  Airway Management Planned: Oral ETT  Additional Equipment:   Intra-op Plan:   Post-operative Plan: Extubation in OR  Informed Consent: I have reviewed the patients History and Physical, chart, labs and discussed the procedure including the risks, benefits and alternatives for the proposed anesthesia with the patient or authorized representative who has indicated his/her understanding and acceptance.   Dental advisory given  Plan Discussed with: CRNA, Anesthesiologist and Surgeon  Anesthesia Plan Comments:         Anesthesia Quick Evaluation

## 2015-06-18 NOTE — Anesthesia Procedure Notes (Signed)
Procedure Name: Intubation Date/Time: 06/18/2015 1:17 PM Performed by: CHS IncSTERLING, Jannet AskewHARLESETTA M Pre-anesthesia Checklist: Patient identified, Timeout performed, Emergency Drugs available, Suction available and Patient being monitored Patient Re-evaluated:Patient Re-evaluated prior to inductionOxygen Delivery Method: Circle system utilized Preoxygenation: Pre-oxygenation with 100% oxygen Intubation Type: IV induction Ventilation: Mask ventilation without difficulty Laryngoscope Size: Mac and 3 Grade View: Grade I Tube type: Oral Number of attempts: 1

## 2015-06-18 NOTE — Transfer of Care (Signed)
Immediate Anesthesia Transfer of Care Note  Patient: Angelica ColtShanequa Blankenship  Procedure(s) Performed: Procedure(s) with comments: LAPAROSCOPY DIAGNOSTIC (N/A) - 90 min. requested  ROBOTIC ASSISTED DIAGNOSTIC LAPAROSCOPY WITH EXCISION OF PELVIC ENDOMETRIOSIS and lysis of adhesions, chromopertubation (N/A)  Patient Location: PACU  Anesthesia Type:General  Level of Consciousness: awake, alert  and oriented  Airway & Oxygen Therapy: Patient Spontanous Breathing and Patient connected to nasal cannula oxygen  Post-op Assessment: Report given to RN and Post -op Vital signs reviewed and stable  Post vital signs: Reviewed and stable  Last Vitals:  Filed Vitals:   06/18/15 1210  BP: 140/80  Pulse: 84  Temp: 36.9 C  Resp: 20    Complications: No apparent anesthesia complications

## 2015-06-18 NOTE — Brief Op Note (Addendum)
06/18/2015  4:23 PM  PATIENT:  Angelica ColtShanequa Wenner  [age] y.o. female  PRE-OPERATIVE DIAGNOSIS:  Left Lower Quadrant Pain, Rule Out Pelvic Endometriosis  POST-OPERATIVE DIAGNOSIS:  left lower quadrant pain, pelvic endometriosis  PROCEDURE:  Procedure(s) with comments: LAPAROSCOPY DIAGNOSTIC (N/A) - 90 min. requested  ROBOTIC ASSISTED DIAGNOSTIC LAPAROSCOPY WITH EXCISION OF PELVIC ENDOMETRIOSIS and lysis of adhesions, chromopertubation (N/A)  SURGEON:  Surgeon(s) and Role:    * Maxie BetterSheronette Shelsea Hangartner, MD - Primary  PHYSICIAN ASSISTANT:   ASSISTANTS: Raelyn Moraolitta Dawson, CNM  ANESTHESIA:   general   Findings: left ovary attached to posterior wall of uterus, right ovary with CLC, endometriosis on left tube and sl phimosis Of distal end of left tube, right pelvic brim, endometriotic implants, nl liver, appendix with adhesion( free tip), right tube nl. Free spont spillage on right, spill from left tube but not as vigorous as on right, nl anterior cul de sac, post cul de sac adhesions, nl uterus, left ovary with surrounding adhesions  EBL:  Total I/O In: 1600 [I.V.:1600] Out: 250 [Urine:150; Blood:100]  BLOOD ADMINISTERED:none  DRAINS: none   LOCAL MEDICATIONS USED:  MARCAINE    and OTHER Ropivacaine  SPECIMEN:  Source of Specimen:  peritoneal endometriosis  DISPOSITION OF SPECIMEN:  PATHOLOGY  COUNTS:  YES  TOURNIQUET:  * No tourniquets in log *  DICTATION: .Other Dictation: Dictation Number [id number]  PLAN OF CARE: Discharge to home after PACU  PATIENT DISPOSITION:  PACU - hemodynamically stable.   Delay start of Pharmacological VTE agent (>24hrs) due to surgical blood loss or risk of bleeding: no

## 2015-06-18 NOTE — Anesthesia Postprocedure Evaluation (Signed)
Anesthesia Post Note  Patient: Angelica ColtShanequa Blankenship  Procedure(s) Performed: Procedure(s) (LRB): LAPAROSCOPY DIAGNOSTIC (N/A)  ROBOTIC ASSISTED DIAGNOSTIC LAPAROSCOPY WITH EXCISION OF PELVIC ENDOMETRIOSIS and lysis of adhesions, chromopertubation (N/A)  Patient location during evaluation: PACU Anesthesia Type: General Level of consciousness: awake and alert Pain management: pain level controlled Vital Signs Assessment: post-procedure vital signs reviewed and stable Respiratory status: spontaneous breathing, nonlabored ventilation, respiratory function stable and patient connected to nasal cannula oxygen Cardiovascular status: blood pressure returned to baseline and stable Postop Assessment: no signs of nausea or vomiting Anesthetic complications: no    Last Vitals:  Filed Vitals:   06/18/15 1615 06/18/15 1630  BP: 143/88 140/88  Pulse: 81 77  Temp:    Resp: 16 17    Last Pain:  Filed Vitals:   06/18/15 1637  PainSc: 8                  Vietta Bonifield

## 2015-06-19 ENCOUNTER — Encounter (HOSPITAL_COMMUNITY): Payer: Self-pay | Admitting: Obstetrics and Gynecology

## 2015-06-19 NOTE — Op Note (Signed)
Angelica Blankenship, Angelica Blankenship              ACCOUNT NO.:  [id number]  MEDICAL RECORD NO.:  [id number]  LOCATION:  [facility]                          FACILITY:  [facility]  PHYSICIAN:  Maxie Better, M.D.DATE OF BIRTH:  1991-04-16  DATE OF PROCEDURE:  06/18/2015 DATE OF DISCHARGE:  06/18/2015                              OPERATIVE REPORT   PREOPERATIVE DIAGNOSIS:  Persistent left lower quadrant pain, rule out pelvic endometriosis.  PROCEDURES:  Robotic-assisted laparoscopy with excision of pelvic endometriosis.  Lysis of adhesions.  Chromopertubation.  POSTOPERATIVE DIAGNOSES:  Left lower quadrant pain, pelvic endometriosis, pelvic adhesions.  ANESTHESIA:  General.  SURGEON:  Maxie Better, MD.  ASSISTANTRaelyn Mora, CNM  DESCRIPTION OF PROCEDURE:  Under adequate general anesthesia, the patient was placed in the dorsal lithotomy position.  She was sterilely prepped and draped in usual fashion.  Indwelling Foley catheter was sterilely placed.  A bivalve speculum was placed in the vagina.  A single-tooth tenaculum was placed on the anterior lip of the cervix and Acorn cannula was introduced into the cervical os and attached to the tenaculum for manipulation of the uterus.  The bivalve speculum was then removed.  Attention was then turned to the abdomen.  A 0.25% Marcaine was injected in the infraumbilical site.  An incision was then made. Veress needle was introduced.  Opening pressure of 3 was noted.  A 3 L of CO2 was insufflated.  Veress needle was then removed.  A 12 mm disposable trocar was attempted to be placed, however, bleeding was noted at the incision site.  After multiple attempts in preinsufflation of the abdomen, the trocar was able to be then placed.  The robotic camera was then introduced.  No obvious trauma was noted to the underlying structures.  However, blood was noted in the pelvis.  The left adnexa and ovary appeared to be attached to the posterior aspect  of the uterus.  At that point, a suprapubic port site was then placed after 0.25% Marcaine was injected and under direct visualization, a 5 mm trocar was placed.  Using [employer] irrigator, the pelvis was irrigated and suctioned and further inspection was then noted.  The ovary on the left with blunt dissection was peeled off the posterior wall of the uterus. Multiple periovarian adhesions were noted on both sides, left greater than the right.  Endometriosis was on the surface of the left fallopian tube.  There was also 2 satellite puckering lesions just above the right pelvic brim on the lateral wall.  Normal liver edge was noted.  The appendix distal end was free, but was elongated, but the midportion had adhesions to the pelvic wall on the right.  Given the findings, decision was then made to proceed with assistance with the robot.  Two additional robotic port site was placed right and left and a 5 mm Air Seal port was also placed in the right lower quadrant.  Once all these were placed under direct visualization, the robot was docked to the patient's left side and arm #1, the monopolar scissors was placed and on arm #2, the PK dissector was placed.  I then went to the surgical console.  At the surgical console, the  pelvis was further inspected.  Further blunt dissection of the left ovary off the pelvic sidewall was done.  Diffuse adhesions were noted on the fundal and posterior aspect of the uterus surrounding the ovary and involved some of the tubes, distal end of the tube.  Similar finding was noted on the right fallopian tube, but however, was much freer on the right side than on the left.  There was puckering of the distal end of the right tube, which appears to have surrounding adhesions on the neck almost like phimosis of the distal end of the tube.  Starting with the left side, the endometriotic implant on the tube was removed.  The surrounding adhesions involving the ovary  and the tube was also removed.  The adhesions and tissue in the posterior cul-de-sac was removed off the posterior wall.  Once all these areas were freed up, the attention was then turned to the right side.  Similar adhesions were then removed from around the tube.  There was a cyst on the ovary that appeared to be a corpus luteum that was left in place. The fallopian tube on the right was free.  The pelvis was then irrigated and suctioned.  Chromopertubation using methylene solution through the Acorn cannula was then performed.  Spontaneous spillage on the right was then noted.  No spillage was noted on the left nor was there distance of the tubes, uterus was shifted and continued instillation of the methylene blue solution was then done with final dripping of spillage from the tube on the left, not as aggressive as was seen on the right. The phimotic area with surrounding adhesions was freed up off that left distal end of the left tube and the paratubal cyst that appears to cause blunting of the fallopian tube fimbriated end was removed off that left tube as well.  Attention then was turned back to the satellite adhesions noted with the puckering above the right pelvic rim.  At that point, the peritoneum was puckered, cut, and instillation of fluid underneath the peritoneum was noted on both lesions with subsequent excision of both endometriotic lesions.  Good hemostasis achieved with small cauterization of that area.  The appendix was inspected.  There was no evidence of endometriosis on it.  We had some midportion adhesions, but which was left at its site.  The pelvis was irrigated and suctioned with good hemostasis noted.  No other lesions noted.  The fluid was suctioned from the pelvis.  The robotic instruments were removed.  The robot was undocked.  I went back to the patient' bedside sterilely and instilled ropivacaine in the pelvis as well as placed Interceed in pelvis.  At  that point, the robotic port sites were removed.  The [employer] was removed and incision sites were closed with fascia been identified in the umbilical site and closed with 0 Vicryl figure-of-eight suture and the skin incisions were all closed with 4-0 Vicryl subcuticular sutures. Instruments from the vagina were removed.  Specimen was endometriosis all sent to Pathology.  Estimated blood loss was 100 mL.  FLUID:  Intraoperative fluids 600 mL.  URINE OUTPUT:  150 mL.  COUNTS:  Sponge and instrument counts x2 was correct.  COMPLICATIONS:  None.  The patient tolerated the procedure well, was transferred to recovery room in stable condition.  Of note, intraoperative doxycycline was given with the chromopertubation.     Maxie Better, M.D.     [place]/MEDQ  D:  06/19/2015  T:  06/19/2015  Job:  [id number]

## 2015-09-29 ENCOUNTER — Emergency Department (HOSPITAL_COMMUNITY)
Admission: EM | Admit: 2015-09-29 | Discharge: 2015-09-29 | Disposition: A | Discharge status: Home / Self Care | Payer: No Typology Code available for payment source | Attending: Emergency Medicine | Admitting: Emergency Medicine

## 2015-09-29 ENCOUNTER — Encounter (HOSPITAL_COMMUNITY): Payer: Self-pay

## 2015-09-29 ENCOUNTER — Emergency Department (HOSPITAL_COMMUNITY): Payer: No Typology Code available for payment source

## 2015-09-29 DIAGNOSIS — S40012A Contusion of left shoulder, initial encounter: Secondary | ICD-10-CM | POA: Diagnosis not present

## 2015-09-29 DIAGNOSIS — F17210 Nicotine dependence, cigarettes, uncomplicated: Secondary | ICD-10-CM | POA: Diagnosis not present

## 2015-09-29 DIAGNOSIS — Z792 Long term (current) use of antibiotics: Secondary | ICD-10-CM | POA: Diagnosis not present

## 2015-09-29 DIAGNOSIS — Z79899 Other long term (current) drug therapy: Secondary | ICD-10-CM | POA: Insufficient documentation

## 2015-09-29 DIAGNOSIS — S4992XA Unspecified injury of left shoulder and upper arm, initial encounter: Secondary | ICD-10-CM | POA: Diagnosis present

## 2015-09-29 DIAGNOSIS — Y9389 Activity, other specified: Secondary | ICD-10-CM | POA: Insufficient documentation

## 2015-09-29 DIAGNOSIS — Y998 Other external cause status: Secondary | ICD-10-CM | POA: Insufficient documentation

## 2015-09-29 DIAGNOSIS — S199XXA Unspecified injury of neck, initial encounter: Secondary | ICD-10-CM | POA: Insufficient documentation

## 2015-09-29 DIAGNOSIS — Y92410 Unspecified street and highway as the place of occurrence of the external cause: Secondary | ICD-10-CM | POA: Diagnosis not present

## 2015-09-29 DIAGNOSIS — S3992XA Unspecified injury of lower back, initial encounter: Secondary | ICD-10-CM | POA: Insufficient documentation

## 2015-09-29 DIAGNOSIS — R011 Cardiac murmur, unspecified: Secondary | ICD-10-CM | POA: Insufficient documentation

## 2015-09-29 MED ORDER — HYDROCODONE-ACETAMINOPHEN 5-325 MG PO TABS
2.0000 | ORAL_TABLET | Freq: Once | ORAL | Status: AC
  Administered 2015-09-29: 2 via ORAL
  Filled 2015-09-29: qty 2

## 2015-09-29 MED ORDER — HYDROCODONE-ACETAMINOPHEN 5-325 MG PO TABS: 1.0000 | ORAL_TABLET | Freq: Four times a day (QID) | ORAL | Status: DC | PRN
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
## 2015-09-29 NOTE — Discharge Instructions (Signed)
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
## 2015-12-01 ENCOUNTER — Other Ambulatory Visit (HOSPITAL_COMMUNITY): Payer: Self-pay | Admitting: Obstetrics and Gynecology

## 2015-12-01 DIAGNOSIS — Z3169 Encounter for other general counseling and advice on procreation: Secondary | ICD-10-CM

## 2015-12-03 ENCOUNTER — Ambulatory Visit (HOSPITAL_COMMUNITY)
Admission: RE | Admit: 2015-12-03 | Discharge: 2015-12-03 | Disposition: A | Discharge status: Home / Self Care | Payer: BLUE CROSS/BLUE SHIELD | Source: Ambulatory Visit | Attending: Obstetrics and Gynecology | Admitting: Obstetrics and Gynecology

## 2015-12-03 DIAGNOSIS — Z3169 Encounter for other general counseling and advice on procreation: Secondary | ICD-10-CM | POA: Insufficient documentation

## 2015-12-03 IMAGING — RF DG HYSTEROGRAM
5 series · 5 of 5 positions shown · IV contrast (omnipaque)
Comparison: None.

CLINICAL DATA: Recent miscarriage approximately 6 months ago.
Endometriosis. Pre conception counseling.

EXAM:
HYSTEROSALPINGOGRAM
TECHNIQUE: Following cleansing of the cervix and vagina with Betadine solution,
a hysterosalpingogram was performed using a 5-French
hysterosalpingogram catheter and Omnipaque 300 contrast. The patient
tolerated the examination without difficulty.

[Series 1: run · 1 of 1 slices shown (1 of 5)]
[im 1/1]
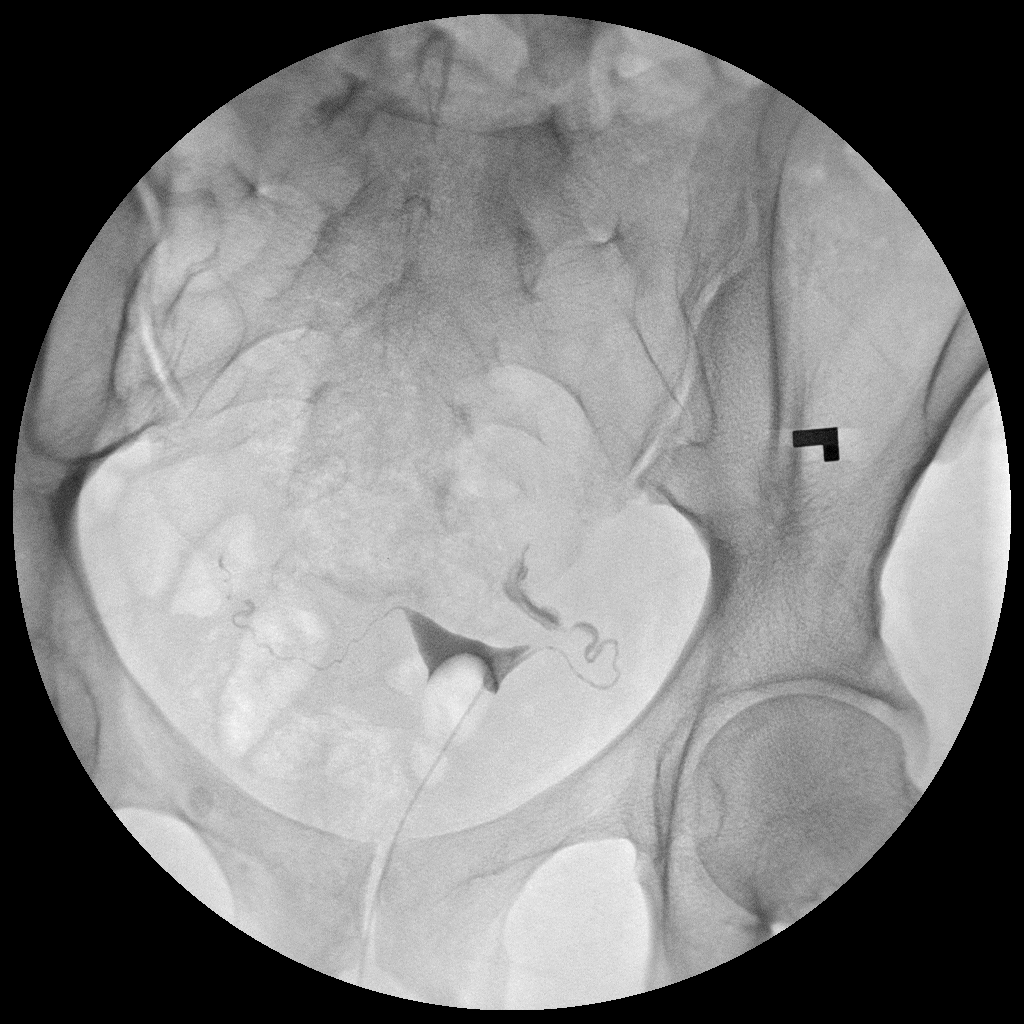

[Series 2: run · 1 of 1 slices shown (2 of 5)]
[im 1/1]
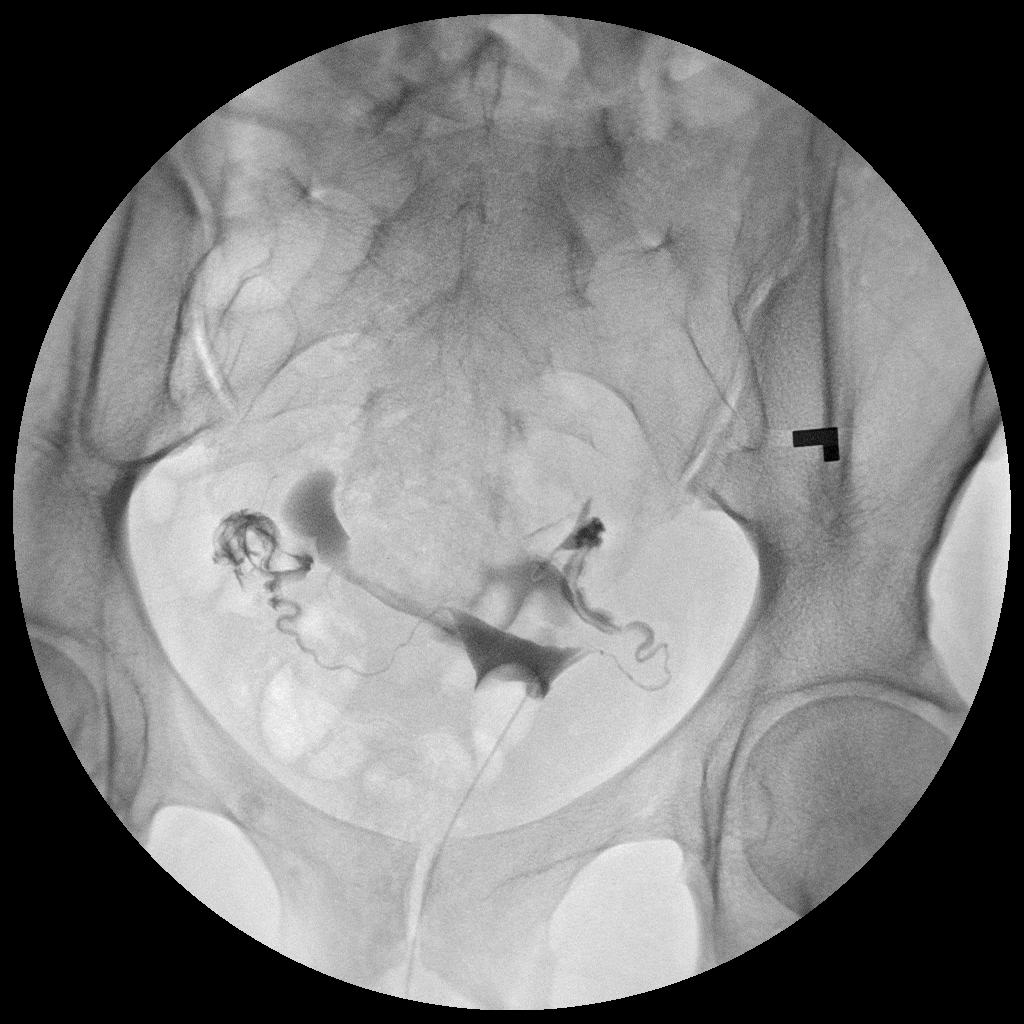

[Series 3: run · 1 of 1 slices shown (3 of 5)]
[im 1/1]
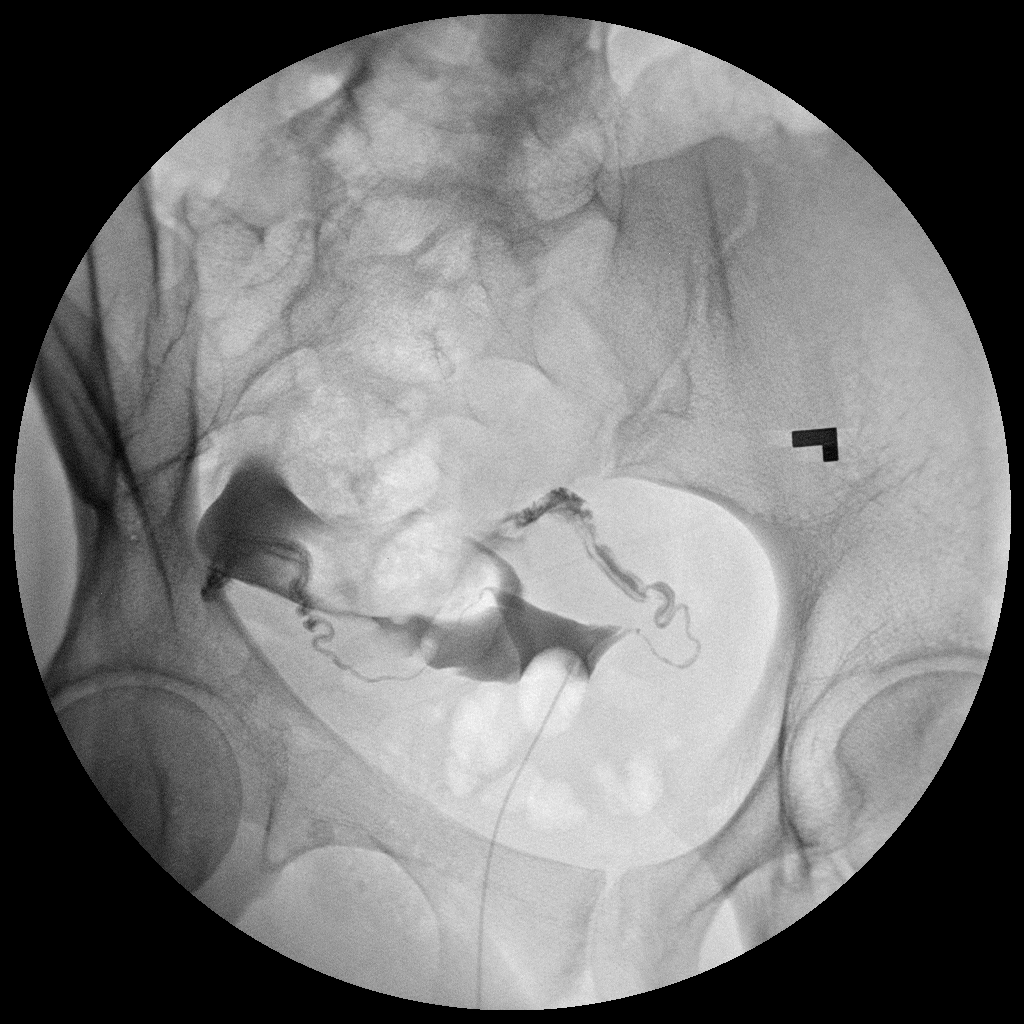

[Series 4: run · 1 of 1 slices shown (4 of 5)]
[im 1/1]
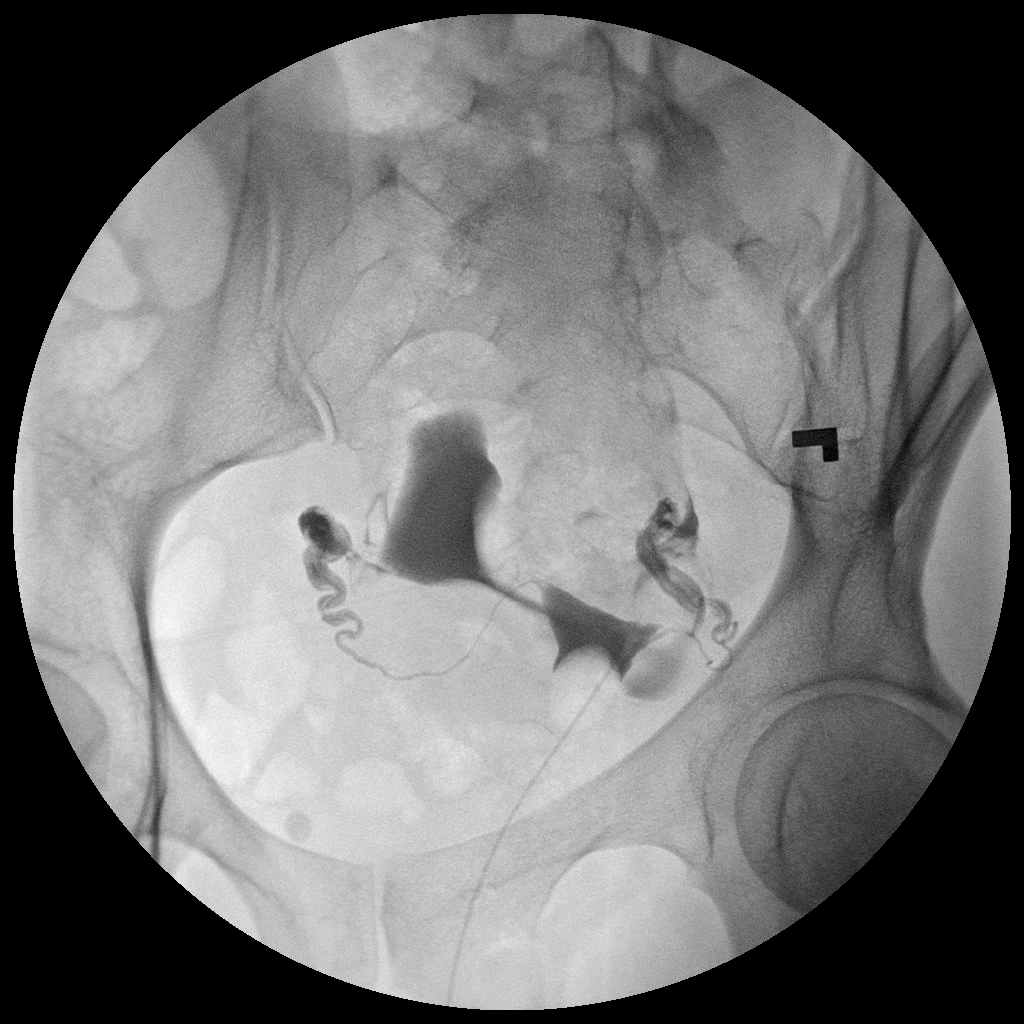

[Series 5: run · 1 of 1 slices shown (5 of 5)]
[im 1/1]
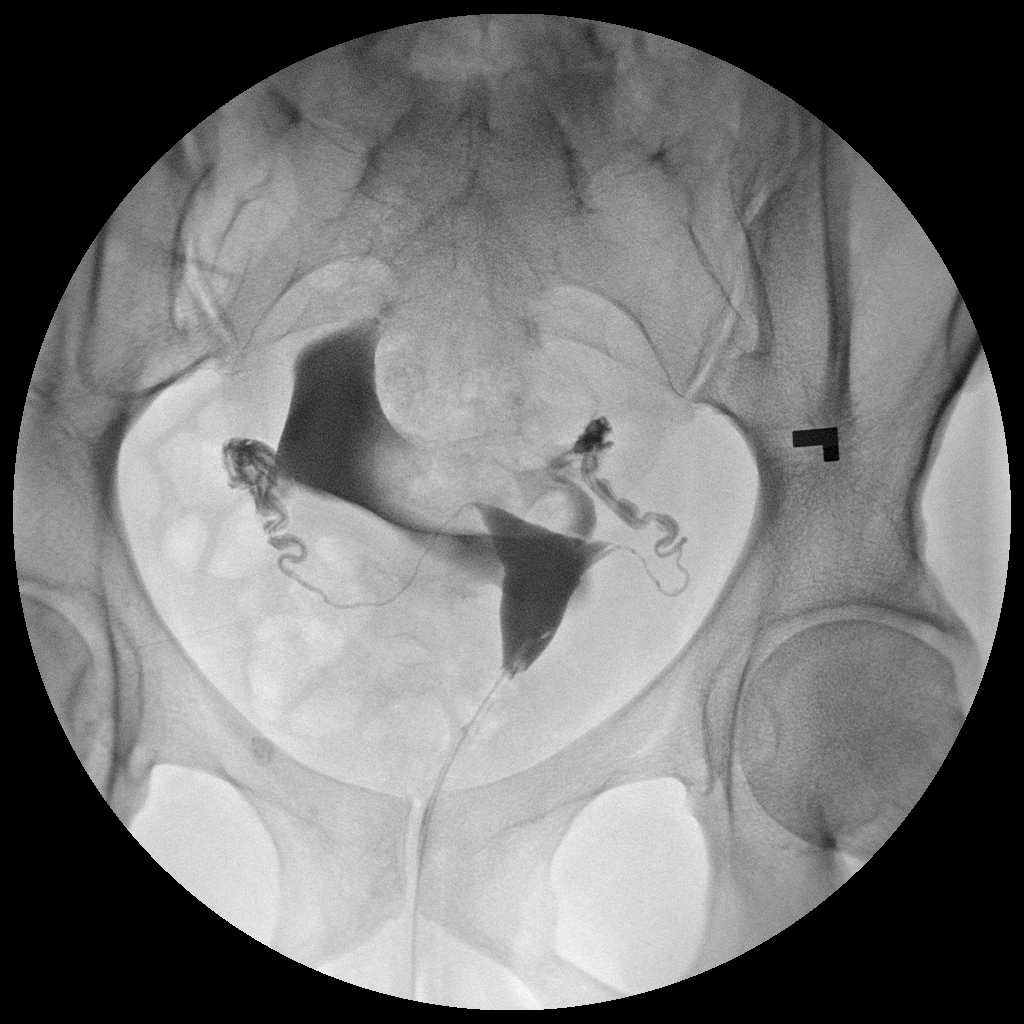

[5 of 5 positions shown; findings below may reference images not displayed]

FLUOROSCOPY TIME:  Fluoroscopy Time:  0 minutes 48 seconds

Number of Acquired Images:  5
FINDINGS: Endometrial Cavity: Normal appearance. No signs of Mullerian duct
anomaly or other significant abnormality.

Right Fallopian Tube: Well opacified and normal in appearance. Free
intraperitoneal spill of contrast is demonstrated.

Left Fallopian Tube: Well opacified and normal in appearance. Free
intraperitoneal spill of contrast is demonstrated.

Other:  None.
IMPRESSION: Normal study. Normal appearance of endometrial cavity. Both
fallopian tubes are patent.

## 2015-12-03 MED ORDER — IOPAMIDOL (ISOVUE-300) INJECTION 61%
30.0000 mL | Freq: Once | INTRAVENOUS | Status: AC | PRN
  Administered 2015-12-03: 30 mL

## 2017-02-09 ENCOUNTER — Encounter (HOSPITAL_COMMUNITY): Payer: Self-pay | Admitting: Emergency Medicine

## 2017-02-09 ENCOUNTER — Ambulatory Visit (HOSPITAL_COMMUNITY)
Admission: EM | Admit: 2017-02-09 | Discharge: 2017-02-09 | Disposition: A | Discharge status: Home / Self Care | Payer: Self-pay | Attending: Internal Medicine | Admitting: Internal Medicine

## 2017-02-09 DIAGNOSIS — N898 Other specified noninflammatory disorders of vagina: Secondary | ICD-10-CM

## 2017-02-09 DIAGNOSIS — N751 Abscess of Bartholin's gland: Secondary | ICD-10-CM

## 2017-02-09 MED ORDER — HYDROCODONE-ACETAMINOPHEN 5-325 MG PO TABS: 1.0000 | ORAL_TABLET | ORAL | 0 refills | Status: DC | PRN

## 2017-02-09 MED ORDER — METRONIDAZOLE 500 MG PO TABS: 500.0000 mg | ORAL_TABLET | Freq: Two times a day (BID) | ORAL | 0 refills | Status: DC

## 2017-02-09 MED ORDER — LIDOCAINE HCL (PF) 2 % IJ SOLN
INTRAMUSCULAR | Status: AC
  Filled 2017-02-09: qty 2

## 2017-02-09 NOTE — ED Triage Notes (Signed)
[facility] a [facility] abscess x 1 week.

## 2017-02-09 NOTE — Discharge Instructions (Signed)
Sit in a warm tub of water tonight when you get home and let this drain. After 15 or 20 minutes stand up but the water drain out and take a good shower and wash yourself all over with soap and water so that the germs do not get all of your body. Apply warm compresses to 3 times a day. Allow this to drain and encourage draining by pressing on the area to 3 times a day. The incision will close up and about 2 days. If it starts to get large again he will need to return. Take the antibiotic as directed as you also have a vaginal infection which is likely bacterial vaginosis. Take pain medicine as directed.

## 2017-02-09 NOTE — ED Provider Notes (Signed)
[facility]    CSN: [id number] Arrival date & time: 02/09/17  1946     History   Chief Complaint Chief Complaint  Patient presents with  . Abscess    HPI Angelica Blankenship is a 26 y.o. female.   26 year old female states that she has what she believes to be a Bartholin's gland cyst abscess. She is never had one before. Complaining of pain and swelling to the right labia. Started about one week ago      Past Medical History:  Diagnosis Date  . Headache   . Heart murmur    asymptomatic    Patient Active Problem List   Diagnosis Date Noted  . Complete miscarriage 09/12/2014    Past Surgical History:  Procedure Laterality Date  . LAPAROSCOPY N/A 06/18/2015   Procedure: LAPAROSCOPY DIAGNOSTIC;  Surgeon: Maxie Better, MD;  Location: [facility];  Service: Gynecology;  Laterality: N/A;  90 min. requested  . NO PAST SURGERIES    . ROBOTIC ASSISTED DIAGNOSTIC LAPAROSCOPY N/A 06/18/2015   Procedure:  ROBOTIC ASSISTED DIAGNOSTIC LAPAROSCOPY WITH EXCISION OF PELVIC ENDOMETRIOSIS and lysis of adhesions, chromopertubation;  Surgeon: Maxie Better, MD;  Location: [facility];  Service: Gynecology;  Laterality: N/A;    OB History    Gravida Para Term Preterm AB Living   2       2     SAB TAB Ectopic Multiple Live Births   1 1   0         Home Medications    Prior to Admission medications   Medication Sig Start Date End Date Taking? Authorizing Provider  HYDROcodone-acetaminophen (NORCO/VICODIN) 5-325 MG tablet Take 1 tablet by mouth every 4 (four) hours as needed. 02/09/17   Hayden Rasmussen, NP  metroNIDAZOLE (FLAGYL) 500 MG tablet Take 1 tablet (500 mg total) by mouth 2 (two) times daily. X 7 days 02/09/17   Hayden Rasmussen, NP    Family History History reviewed. No pertinent family history.  Social History Social History  Substance Use Topics  . Smoking status: Current Every Day Smoker    Packs/day: 1.00    Types: Cigars  . Smokeless tobacco: Not on file  .  Alcohol use Yes     Comment: occasional     Allergies   Patient has no known allergies.   Review of Systems Review of Systems  Constitutional: Negative.   Respiratory: Negative.   Genitourinary: Positive for vaginal discharge. Negative for dysuria, frequency and hematuria.  Skin: Negative.   All other systems reviewed and are negative.    Physical Exam Triage Vital Signs ED Triage Vitals  Enc Vitals Group     BP 02/09/17 2008 (!) 145/70     Pulse Rate 02/09/17 2008 83     Resp 02/09/17 2008 18     Temp 02/09/17 2008 98.5 F (36.9 C)     Temp Source 02/09/17 2008 Oral     SpO2 02/09/17 2008 100 %     Weight --      Height --      Head Circumference --      Peak Flow --      Pain Score 02/09/17 2009 10     Pain Loc --      Pain Edu? --      Excl. in GC? --    No data found.   Updated Vital Signs BP (!) 145/70 (BP Location: Right Arm)   Pulse 83   Temp 98.5 F (36.9 C) (Oral)  Resp 18   SpO2 100%   Visual Acuity Right Eye Distance:   Left Eye Distance:   Bilateral Distance:    Right Eye Near:   Left Eye Near:    Bilateral Near:     Physical Exam  Constitutional: She is oriented to person, place, and time. She appears well-developed and well-nourished. No distress.  Neck: Neck supple.  Cardiovascular: Normal rate.   Genitourinary:  Genitourinary Comments: Relatively large abscess to the right labia minora. Positive for tenderness. There is a thin white to gray vaginal discharge.  Musculoskeletal: Normal range of motion.  Neurological: She is alert and oriented to person, place, and time.  Skin: Skin is warm and dry.  Psychiatric: She has a normal mood and affect.  Nursing note and vitals reviewed.    UC Treatments / Results  Labs (all labs ordered are listed, but only abnormal results are displayed) Labs Reviewed - No data to display  EKG  EKG Interpretation None       Radiology No results found.  Procedures .Marland KitchenIncision and  Drainage Date/Time: 02/09/2017 9:07 PM Performed by: Phineas Real, Kathlee Barnhardt Authorized by: Eustace Crisafulli   Consent:    Consent obtained:  Verbal   Consent given by:  Patient   Risks discussed:  Incomplete drainage, pain and infection Location:    Type:  Bartholin cyst   Size:  3.5   Location:  Anogenital   Anogenital location:  Bartholin's gland Pre-procedure details:    Skin preparation:  Betadine Anesthesia (see MAR for exact dosages):    Anesthesia method:  Local infiltration   Local anesthetic:  Lidocaine 2% WITH epi Procedure type:    Complexity:  Simple Procedure details:    Needle aspiration: no     Incision types:  Single straight   Incision depth:  Submucosal   Scalpel blade:  11   Drainage:  Purulent and bloody   Drainage amount:  Copious   Wound treatment:  Wound left open Post-procedure details:    Patient tolerance of procedure:  Tolerated well, no immediate complications   (including critical care time)  Medications Ordered in UC Medications - No data to display   Initial Impression / Assessment and Plan / UC Course  I have reviewed the triage vital signs and the nursing notes.  Pertinent labs & imaging results that were available during my care of the patient were reviewed by me and considered in my medical decision making (see chart for details).     Sit in a warm tub of water tonight when you get home and let this drain. After 15 or 20 minutes stand up but the water drain out and take a good shower and wash yourself all over with soap and water so that the germs do not get all of your body. Apply warm compresses to 3 times a day. Allow this to drain and encourage draining by pressing on the area to 3 times a day. The incision will close up and about 2 days. If it starts to get large again he will need to return. Take the antibiotic as directed as you also have a vaginal infection which is likely bacterial vaginosis. Take pain medicine as directed.   Final  Clinical Impressions(s) / UC Diagnoses   Final diagnoses:  Bartholin's gland abscess  Vaginal discharge    New Prescriptions New Prescriptions   HYDROCODONE-ACETAMINOPHEN (NORCO/VICODIN) 5-325 MG TABLET    Take 1 tablet by mouth every 4 (four) hours as needed.   METRONIDAZOLE (  FLAGYL) 500 MG TABLET    Take 1 tablet (500 mg total) by mouth 2 (two) times daily. X 7 days     Controlled Substance Prescriptions [place] Controlled Substance Registry consulted? Yes, I have consulted the [place] Controlled Substances Registry for this patient, and feel the risk/benefit ratio today is favorable for proceeding with this prescription for a controlled substance.   Hayden RasmussenMabe, Kaavya Puskarich, NP 02/09/17 2108

## 2018-05-08 ENCOUNTER — Encounter (HOSPITAL_COMMUNITY): Payer: Self-pay | Admitting: *Deleted

## 2018-05-08 ENCOUNTER — Emergency Department (HOSPITAL_COMMUNITY)
Admission: EM | Admit: 2018-05-08 | Discharge: 2018-05-08 | Disposition: A | Discharge status: Home / Self Care | Payer: Commercial Managed Care - HMO | Attending: Emergency Medicine | Admitting: Emergency Medicine

## 2018-05-08 DIAGNOSIS — N751 Abscess of Bartholin's gland: Secondary | ICD-10-CM

## 2018-05-08 DIAGNOSIS — Z87891 Personal history of nicotine dependence: Secondary | ICD-10-CM | POA: Insufficient documentation

## 2018-05-08 DIAGNOSIS — Z79899 Other long term (current) drug therapy: Secondary | ICD-10-CM | POA: Insufficient documentation

## 2018-05-08 DIAGNOSIS — N750 Cyst of Bartholin's gland: Secondary | ICD-10-CM | POA: Insufficient documentation

## 2018-05-08 MED ORDER — LIDOCAINE HCL (PF) 1 % IJ SOLN
5.0000 mL | Freq: Once | INTRAMUSCULAR | Status: AC
  Administered 2018-05-08: 5 mL
  Filled 2018-05-08: qty 5

## 2018-05-08 MED ORDER — SULFAMETHOXAZOLE-TRIMETHOPRIM 800-160 MG PO TABS: 1.0000 | ORAL_TABLET | Freq: Two times a day (BID) | ORAL | 0 refills | Status: AC

## 2018-05-08 NOTE — ED Triage Notes (Signed)
Pt in c/o R vaginal abcess with hx of the same onset x 2 days, pt denies fever or chills, pt required I&D previously for same issue, A&O x4

## 2018-05-08 NOTE — ED Provider Notes (Signed)
[facility] EMERGENCY DEPARTMENT Provider Note   CSN: [id number] Arrival date & time: 05/08/18  1052     History   Chief Complaint Chief Complaint  Patient presents with  . Abscess    HPI Angelica Blankenship is a 27 y.o. female.  27 year old female presents with complaint of abscess on her labia.  Patient states this has been present for the past 2 days, denies fevers, abdominal pain, vomiting, changes in bowel or bladder habits, discharge.  Reports similar problem in the same area last year, treated with I&D in the ER.  No other complaints or concerns.     Past Medical History:  Diagnosis Date  . Headache   . Heart murmur    asymptomatic    Patient Active Problem List   Diagnosis Date Noted  . Complete miscarriage 09/12/2014    Past Surgical History:  Procedure Laterality Date  . LAPAROSCOPY N/A 06/18/2015   Procedure: LAPAROSCOPY DIAGNOSTIC;  Surgeon: Maxie Better, MD;  Location: [facility];  Service: Gynecology;  Laterality: N/A;  90 min. requested  . NO PAST SURGERIES    . ROBOTIC ASSISTED DIAGNOSTIC LAPAROSCOPY N/A 06/18/2015   Procedure:  ROBOTIC ASSISTED DIAGNOSTIC LAPAROSCOPY WITH EXCISION OF PELVIC ENDOMETRIOSIS and lysis of adhesions, chromopertubation;  Surgeon: Maxie Better, MD;  Location: [facility];  Service: Gynecology;  Laterality: N/A;     OB History    Gravida  2   Para      Term      Preterm      AB  2   Living        SAB  1   TAB  1   Ectopic      Multiple  0   Live Births               Home Medications    Prior to Admission medications   Medication Sig Start Date End Date Taking? Authorizing Provider  HYDROcodone-acetaminophen (NORCO/VICODIN) 5-325 MG tablet Take 1 tablet by mouth every 4 (four) hours as needed. 02/09/17   Hayden Rasmussen, NP  metroNIDAZOLE (FLAGYL) 500 MG tablet Take 1 tablet (500 mg total) by mouth 2 (two) times daily. X 7 days 02/09/17   Hayden Rasmussen, NP  sulfamethoxazole-trimethoprim  (BACTRIM DS,SEPTRA DS) 800-160 MG tablet Take 1 tablet by mouth 2 (two) times daily for 10 days. 05/08/18 05/18/18  Jeannie Fend, PA-C    Family History No family history on file.  Social History Social History   Tobacco Use  . Smoking status: Former Smoker    Packs/day: 1.00    Types: Cigars    Last attempt to quit: 12/02/2016    Years since quitting: 1.4  . Smokeless tobacco: Never Used  Substance Use Topics  . Alcohol use: Yes    Comment: occasional  . Drug use: Yes    Types: Marijuana    Comment: last use a few months ago     Allergies   Patient has no known allergies.   Review of Systems Review of Systems  Constitutional: Negative for chills and fever.  Gastrointestinal: Negative for abdominal pain, nausea and vomiting.  Genitourinary: Positive for vaginal pain. Negative for vaginal bleeding and vaginal discharge.  Skin: Negative for rash and wound.  Allergic/Immunologic: Negative for immunocompromised state.  Hematological: Negative for adenopathy.  Psychiatric/Behavioral: Negative for confusion.  All other systems reviewed and are negative.    Physical Exam Updated Vital Signs BP 135/74 (BP Location: Right Arm)   Pulse 78  Temp 99.6 F (37.6 C) (Oral)   Resp 17   Ht 5\' 6"  (1.676 m)   Wt 64 kg   SpO2 99%   BMI 22.76 kg/m   Physical Exam  Constitutional: She is oriented to person, place, and time. She appears well-developed and well-nourished. No distress.  HENT:  Head: Normocephalic and atraumatic.  Pulmonary/Chest: Effort normal.  Genitourinary:     Neurological: She is alert and oriented to person, place, and time. No sensory deficit.  Skin: Skin is warm and dry. She is not diaphoretic.  Psychiatric: She has a normal mood and affect. Her behavior is normal.  Nursing note and vitals reviewed.    ED Treatments / Results  Labs (all labs ordered are listed, but only abnormal results are displayed) Labs Reviewed - No data to  display  EKG None  Radiology No results found.  Procedures .Marland KitchenIncision and Drainage Date/Time: 05/08/2018 1:44 PM Performed by: Jeannie Fend, PA-C Authorized by: Jeannie Fend, PA-C   Consent:    Consent obtained:  Verbal   Consent given by:  Patient   Risks discussed:  Bleeding, incomplete drainage, pain and damage to other organs   Alternatives discussed:  No treatment Universal protocol:    Procedure explained and questions answered to patient or proxy's satisfaction: yes     Relevant documents present and verified: yes     Immediately prior to procedure a time out was called: yes     Patient identity confirmed:  Verbally with patient Location:    Type:  Bartholin cyst   Size:  3cm x 2cm   Location:  Anogenital   Anogenital location:  Bartholin's gland Pre-procedure details:    Skin preparation:  Betadine Anesthesia (see MAR for exact dosages):    Anesthesia method:  Local infiltration   Local anesthetic:  Lidocaine 1% w/o epi Procedure type:    Complexity:  Simple Procedure details:    Incision types:  Single straight   Incision depth:  Subcutaneous   Scalpel blade:  11   Wound management:  Probed and deloculated, irrigated with saline and extensive cleaning   Drainage:  Purulent   Drainage amount:  Moderate   Packing materials:  None Post-procedure details:    Patient tolerance of procedure:  Tolerated well, no immediate complications   (including critical care time)  Medications Ordered in ED Medications  lidocaine (PF) (XYLOCAINE) 1 % injection 5 mL (5 mLs Infiltration Given 05/08/18 1320)     Initial Impression / Assessment and Plan / ED Course  I have reviewed the triage vital signs and the nursing notes.  Pertinent labs & imaging results that were available during my care of the patient were reviewed by me and considered in my medical decision making (see chart for details).  Clinical Course as of May 08 1344  Tue May 08, 2018  1344 Right side  by bartholin's gland abscess drained.  Patient was placed on antibiotics and referred to OB/GYN for further care.   [LM]    Clinical Course User Index [LM] Jeannie Fend, PA-C   Final Clinical Impressions(s) / ED Diagnoses   Final diagnoses:  Bartholin's gland abscess    ED Discharge Orders         Ordered    sulfamethoxazole-trimethoprim (BACTRIM DS,SEPTRA DS) 800-160 MG tablet  2 times daily     05/08/18 1341           Alden Hipp 05/08/18 1345    Derwood Kaplan, MD  05/09/18 1911  

## 2018-05-08 NOTE — Discharge Instructions (Signed)
Take antibiotics as prescribed for full course. Follow-up with OB/GYN, referral given if needed. Return to ER for severe or concerning symptoms. Warm compresses to area for 20 minutes at a time.

## 2018-05-11 ENCOUNTER — Other Ambulatory Visit: Payer: Self-pay

## 2018-05-11 ENCOUNTER — Emergency Department (HOSPITAL_COMMUNITY)
Admission: EM | Admit: 2018-05-11 | Discharge: 2018-05-11 | Disposition: A | Discharge status: Home / Self Care | Payer: Self-pay | Attending: Emergency Medicine | Admitting: Emergency Medicine

## 2018-05-11 ENCOUNTER — Encounter (HOSPITAL_COMMUNITY): Payer: Self-pay | Admitting: *Deleted

## 2018-05-11 DIAGNOSIS — Z79899 Other long term (current) drug therapy: Secondary | ICD-10-CM | POA: Insufficient documentation

## 2018-05-11 DIAGNOSIS — N751 Abscess of Bartholin's gland: Secondary | ICD-10-CM | POA: Insufficient documentation

## 2018-05-11 DIAGNOSIS — Z87891 Personal history of nicotine dependence: Secondary | ICD-10-CM | POA: Insufficient documentation

## 2018-05-11 MED ORDER — LIDOCAINE HCL 2 % IJ SOLN
10.0000 mL | Freq: Once | INTRAMUSCULAR | Status: AC
  Administered 2018-05-11: 200 mg via INTRADERMAL
  Filled 2018-05-11: qty 20

## 2018-05-11 NOTE — Discharge Instructions (Signed)
Please take all of your antibiotics until finished!   You may develop abdominal discomfort or diarrhea from the antibiotic.  You may help offset this with probiotics which you can buy or get in yogurt. Do not eat  or take the probiotics until 2 hours after your antibiotic.   Keep wound clean and dry. Apply warm compresses throughout the day.  Avoid submerging the area in warm water for at least 24 hours (no baths or sits baths). You can alternate 600 mg of ibuprofen and [id number] mg of Tylenol every 3 hours as needed for pain. Do not exceed 4000 mg of Tylenol daily.  Take ibuprofen with food to avoid upset stomach issues.  Followup with [facility] or in the emergency department in 2 days for wound recheck and packing removal.  Also follow-up with your OB/GYN.  Return to emergency department for emergent changing or worsening symptoms such as abnormal drainage, fevers, severe pain or swelling.  Follow-up with your OB/GYN for reevaluation of the recurrent abscesses.  They will likely recommend surgical removal.

## 2018-05-11 NOTE — ED Triage Notes (Signed)
PT here for recheck of abscess. Pt reports swelling is not better after visit on 05-08-18

## 2018-05-11 NOTE — ED Provider Notes (Signed)
[facility] EMERGENCY DEPARTMENT Provider Note   CSN: [id number] Arrival date & time: 05/11/18  7829     History   Chief Complaint Chief Complaint  Patient presents with  . recheck abscess    HPI Angelica Blankenship is a 27 y.o. female presents for evaluation of acute onset, ongoing swelling and pain to the right labia for 5 days.  She states that 2 days ago she presented to the ED for evaluation and was diagnosed with a Bartholin gland abscess and underwent I&D.  She states that she has had a similar problem in previous years.  She was discharged with Bactrim which she states she has been taking without difficulty.  She notes no drainage from the area and ongoing swelling and soreness.  Pain worsens with movement, improves with warm compresses.  She denies fevers, nausea, vomiting, diaphoresis, abdominal pain, urinary symptoms, abnormal vaginal discharge, or vaginal bleeding.   The history is provided by the patient.    Past Medical History:  Diagnosis Date  . Headache   . Heart murmur    asymptomatic    Patient Active Problem List   Diagnosis Date Noted  . Complete miscarriage 09/12/2014    Past Surgical History:  Procedure Laterality Date  . LAPAROSCOPY N/A 06/18/2015   Procedure: LAPAROSCOPY DIAGNOSTIC;  Surgeon: Maxie Better, MD;  Location: [facility];  Service: Gynecology;  Laterality: N/A;  90 min. requested  . NO PAST SURGERIES    . ROBOTIC ASSISTED DIAGNOSTIC LAPAROSCOPY N/A 06/18/2015   Procedure:  ROBOTIC ASSISTED DIAGNOSTIC LAPAROSCOPY WITH EXCISION OF PELVIC ENDOMETRIOSIS and lysis of adhesions, chromopertubation;  Surgeon: Maxie Better, MD;  Location: [facility];  Service: Gynecology;  Laterality: N/A;     OB History    Gravida  2   Para      Term      Preterm      AB  2   Living        SAB  1   TAB  1   Ectopic      Multiple  0   Live Births               Home Medications    Prior to Admission medications     Medication Sig Start Date End Date Taking? Authorizing Provider  HYDROcodone-acetaminophen (NORCO/VICODIN) 5-325 MG tablet Take 1 tablet by mouth every 4 (four) hours as needed. 02/09/17   Hayden Rasmussen, NP  metroNIDAZOLE (FLAGYL) 500 MG tablet Take 1 tablet (500 mg total) by mouth 2 (two) times daily. X 7 days 02/09/17   Hayden Rasmussen, NP  sulfamethoxazole-trimethoprim (BACTRIM DS,SEPTRA DS) 800-160 MG tablet Take 1 tablet by mouth 2 (two) times daily for 10 days. 05/08/18 05/18/18  Jeannie Fend, PA-C    Family History History reviewed. No pertinent family history.  Social History Social History   Tobacco Use  . Smoking status: Former Smoker    Packs/day: 1.00    Types: Cigars    Last attempt to quit: 12/02/2016    Years since quitting: 1.4  . Smokeless tobacco: Never Used  Substance Use Topics  . Alcohol use: Yes    Comment: occasional  . Drug use: Yes    Types: Marijuana    Comment: last use a few months ago     Allergies   Patient has no allergy information on record.   Review of Systems Review of Systems  Constitutional: Negative for chills, diaphoresis and fever.  Gastrointestinal: Negative for abdominal pain,  nausea and vomiting.  Genitourinary: Positive for vaginal pain. Negative for vaginal bleeding and vaginal discharge.     Physical Exam Updated Vital Signs BP (!) 127/101 (BP Location: Right Arm)   Pulse 94   Temp 98.6 F (37 C) (Oral)   Resp 16   Ht 5\' 6"  (1.676 m)   Wt 63 kg   BMI 22.42 kg/m   Physical Exam  Constitutional: She appears well-developed and well-nourished. No distress.  HENT:  Head: Normocephalic and atraumatic.  Eyes: Conjunctivae are normal. Right eye exhibits no discharge. Left eye exhibits no discharge.  Neck: No JVD present. No tracheal deviation present.  Cardiovascular: Normal rate.  Pulmonary/Chest: Effort normal.  Abdominal: Soft. Bowel sounds are normal. She exhibits no distension. There is no tenderness. There is no guarding.   Genitourinary:     Genitourinary Comments: Examination performed in the presence of a chaperone.  The right Bartholin gland abscess with fluctuance and tenderness.  No surrounding erythema or induration.  No drainage.  Thin white vaginal discharge noted.  Musculoskeletal: She exhibits no edema.  Neurological: She is alert.  Skin: No erythema.  Psychiatric: She has a normal mood and affect. Her behavior is normal.  Nursing note and vitals reviewed.    ED Treatments / Results  Labs (all labs ordered are listed, but only abnormal results are displayed) Labs Reviewed - No data to display  EKG None  Radiology No results found.  Procedures .Marland KitchenIncision and Drainage Date/Time: 05/11/2018 10:29 AM Performed by: Jeanie Sewer, PA-C Authorized by: Jeanie Sewer, PA-C   Consent:    Consent obtained:  Verbal   Consent given by:  Patient   Risks discussed:  Bleeding, incomplete drainage, infection and pain   Alternatives discussed:  Alternative treatment (word catheter vs packing) Location:    Type:  Bartholin cyst   Size:  3cm   Location:  Anogenital   Anogenital location:  Bartholin's gland Pre-procedure details:    Skin preparation:  Betadine Anesthesia (see MAR for exact dosages):    Anesthesia method:  Local infiltration   Local anesthetic:  Lidocaine 2% w/o epi Procedure type:    Complexity:  Complex Procedure details:    Needle aspiration: no     Incision types:  Stab incision   Incision depth:  Dermal   Scalpel blade:  11   Wound management:  Probed and deloculated, irrigated with saline and extensive cleaning   Drainage:  Bloody and purulent   Drainage amount:  Copious   Wound treatment:  Drain placed   Packing materials:  1/4 in iodoform gauze Post-procedure details:    Patient tolerance of procedure:  Tolerated well, no immediate complications Comments:     Attempted packing with Word catheter which patient did not tolerate.  She did tolerate iodoform gauze  packing.   (including critical care time)  Medications Ordered in ED Medications  lidocaine (XYLOCAINE) 2 % (with pres) injection 200 mg (200 mg Intradermal Given 05/11/18 0924)     Initial Impression / Assessment and Plan / ED Course  I have reviewed the triage vital signs and the nursing notes.  Pertinent labs & imaging results that were available during my care of the patient were reviewed by me and considered in my medical decision making (see chart for details).     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  She underwent I&D 3 days ago in which no packing was placed and returns with ongoing swelling and pain.  No systemic symptoms she is afebrile, vital signs are stable and she is nontoxic in appearance.  She tolerated the procedure well however I was unable to place a Word catheter as patient did not tolerate this. She did tolerate packing with iodoform gauze, however.  Return in 2 days for wound check and packing removal.  Encouraged home warm soaks and flushing.  She has been on Bactrim since her visit 3 days ago and is tolerating this without difficulty.  I instructed her to complete her antibiotic course.  Recommend follow-up with OB/GYN for reevaluation and possible surgical consultation.  Discussed indications for return to the ED sooner.  Patient and patient's partner verbalized understanding of and agreement with plan and patient is stable for discharge home at this time.  Final Clinical Impressions(s) / ED Diagnoses   Final diagnoses:  Bartholin's gland abscess    ED Discharge Orders    None       Bennye Alm 05/11/18 1034    Mesner, Barbara Cower, MD 05/11/18 [phone]

## 2018-05-13 ENCOUNTER — Emergency Department (HOSPITAL_COMMUNITY)
Admission: EM | Admit: 2018-05-13 | Discharge: 2018-05-13 | Disposition: A | Discharge status: Home / Self Care | Payer: Self-pay | Attending: Emergency Medicine | Admitting: Emergency Medicine

## 2018-05-13 ENCOUNTER — Encounter (HOSPITAL_COMMUNITY): Payer: Self-pay | Admitting: Emergency Medicine

## 2018-05-13 DIAGNOSIS — Z5189 Encounter for other specified aftercare: Secondary | ICD-10-CM

## 2018-05-13 DIAGNOSIS — Z4801 Encounter for change or removal of surgical wound dressing: Secondary | ICD-10-CM | POA: Insufficient documentation

## 2018-05-13 DIAGNOSIS — Z87891 Personal history of nicotine dependence: Secondary | ICD-10-CM | POA: Insufficient documentation

## 2018-05-13 DIAGNOSIS — N750 Cyst of Bartholin's gland: Secondary | ICD-10-CM | POA: Insufficient documentation

## 2018-05-13 LAB — URINALYSIS, ROUTINE W REFLEX MICROSCOPIC
Bilirubin Urine: NEGATIVE
Glucose, UA: NEGATIVE mg/dL
Ketones, ur: 80 mg/dL — AB
Nitrite: NEGATIVE
Protein, ur: 30 mg/dL — AB
Specific Gravity, Urine: 1.028 (ref 1.005–1.030)
pH: 5 (ref 5.0–8.0)

## 2018-05-13 LAB — POC URINE PREG, ED: Preg Test, Ur: NEGATIVE

## 2018-05-13 MED ORDER — IBUPROFEN 600 MG PO TABS: 600.0000 mg | ORAL_TABLET | Freq: Four times a day (QID) | ORAL | 0 refills | Status: DC | PRN

## 2018-05-13 MED ORDER — HYDROCODONE-ACETAMINOPHEN 5-325 MG PO TABS: 1.0000 | ORAL_TABLET | Freq: Four times a day (QID) | ORAL | 0 refills | Status: DC | PRN

## 2018-05-13 MED ORDER — ONDANSETRON HCL 4 MG PO TABS: 4.0000 mg | ORAL_TABLET | Freq: Three times a day (TID) | ORAL | 0 refills | Status: DC | PRN

## 2018-05-13 MED ORDER — ACETAMINOPHEN 325 MG PO TABS
650.0000 mg | ORAL_TABLET | Freq: Once | ORAL | Status: AC
  Administered 2018-05-13: 650 mg via ORAL
  Filled 2018-05-13: qty 2

## 2018-05-13 NOTE — ED Triage Notes (Addendum)
Patient reports she is here for removal of packing of bartholins abcess. Pt reports area is doing well. Denies fevers or chills. Still taking abx she was prescribed

## 2018-05-13 NOTE — ED Provider Notes (Addendum)
[facility] EMERGENCY DEPARTMENT Provider Note   CSN: [id number] Arrival date & time: 05/13/18  0831     History   Chief Complaint Chief Complaint  Patient presents with  . Wound Check    HPI Angelica Blankenship is a 27 y.o. female who was seen here on 11/8 for a Bartholin's abscess on the right side with I&D, packing and discharged home on Bactrim who presents for wound recheck.  Patient reports the area has been doing well.  There has been continued drainage.  She thinks the packing may have fallen out but she is not sure.  She has been taking her antibiotics as prescribed.  She does note some chills yesterday along with nausea as well as one episode of emesis.  She denies any worsening redness, swelling of the area.  No sore throat, nasal congestion, cough, chest pain, shortness breath, abdominal pain, diarrhea, flank pain, urinary frequency, urinary urgency, dysuria, hematuria.  She does note that she started her menstrual cycle today.  She has had no nausea or vomiting today.  HPI  Past Medical History:  Diagnosis Date  . Headache   . Heart murmur    asymptomatic    Patient Active Problem List   Diagnosis Date Noted  . Complete miscarriage 09/12/2014    Past Surgical History:  Procedure Laterality Date  . LAPAROSCOPY N/A 06/18/2015   Procedure: LAPAROSCOPY DIAGNOSTIC;  Surgeon: Maxie Better, MD;  Location: [facility];  Service: Gynecology;  Laterality: N/A;  90 min. requested  . NO PAST SURGERIES    . ROBOTIC ASSISTED DIAGNOSTIC LAPAROSCOPY N/A 06/18/2015   Procedure:  ROBOTIC ASSISTED DIAGNOSTIC LAPAROSCOPY WITH EXCISION OF PELVIC ENDOMETRIOSIS and lysis of adhesions, chromopertubation;  Surgeon: Maxie Better, MD;  Location: [facility];  Service: Gynecology;  Laterality: N/A;     OB History    Gravida  2   Para      Term      Preterm      AB  2   Living        SAB  1   TAB  1   Ectopic      Multiple  0   Live Births               Home Medications    Prior to Admission medications   Medication Sig Start Date End Date Taking? Authorizing Provider  HYDROcodone-acetaminophen (NORCO/VICODIN) 5-325 MG tablet Take 1 tablet by mouth every 4 (four) hours as needed. 02/09/17   Hayden Rasmussen, NP  metroNIDAZOLE (FLAGYL) 500 MG tablet Take 1 tablet (500 mg total) by mouth 2 (two) times daily. X 7 days 02/09/17   Hayden Rasmussen, NP  sulfamethoxazole-trimethoprim (BACTRIM DS,SEPTRA DS) 800-160 MG tablet Take 1 tablet by mouth 2 (two) times daily for 10 days. 05/08/18 05/18/18  Jeannie Fend, PA-C    Family History No family history on file.  Social History Social History   Tobacco Use  . Smoking status: Former Smoker    Packs/day: 1.00    Types: Cigars    Last attempt to quit: 12/02/2016    Years since quitting: 1.4  . Smokeless tobacco: Never Used  Substance Use Topics  . Alcohol use: Yes    Comment: occasional  . Drug use: Yes    Types: Marijuana    Comment: last use a few months ago     Allergies   Patient has no known allergies.   Review of Systems Review of Systems  All other  systems reviewed and are negative.    Physical Exam Updated Vital Signs BP 132/79   Pulse 93   Temp 100.1 F (37.8 C)   Resp 15   LMP 05/13/2018   SpO2 97%   Physical Exam  Constitutional: She appears well-developed and well-nourished.  HENT:  Head: Normocephalic and atraumatic.  Right Ear: External ear normal.  Left Ear: External ear normal.  Nose: Nose normal.  Mouth/Throat: Uvula is midline, oropharynx is clear and moist and mucous membranes are normal. No tonsillar exudate.  Eyes: Pupils are equal, round, and reactive to light. Right eye exhibits no discharge. Left eye exhibits no discharge. No scleral icterus.  Neck: Trachea normal. Neck supple. No spinous process tenderness present. No neck rigidity. Normal range of motion present.  Cardiovascular: Normal rate, regular rhythm and intact distal pulses.  No murmur  heard. Pulses:      Radial pulses are 2+ on the right side, and 2+ on the left side.       Dorsalis pedis pulses are 2+ on the right side, and 2+ on the left side.       Posterior tibial pulses are 2+ on the right side, and 2+ on the left side.  Pulmonary/Chest: Effort normal and breath sounds normal. She exhibits no tenderness.  Abdominal: Soft. Bowel sounds are normal. There is no tenderness. There is no rigidity, no rebound, no guarding and no CVA tenderness.  Genitourinary:  Genitourinary Comments: Chaperone present.  There is a small I&D site from prior visit.  This is open  There is continued edema to the area but no surrounding erythema, or induration. Packing replaced.   Musculoskeletal: She exhibits no edema.  Lymphadenopathy:    She has no cervical adenopathy.  Neurological: She is alert.  Skin: Skin is warm and dry. No rash noted. She is not diaphoretic.  Psychiatric: She has a normal mood and affect.  Nursing note and vitals reviewed.    ED Treatments / Results  Labs (all labs ordered are listed, but only abnormal results are displayed) Labs Reviewed - No data to display  EKG None  Radiology No results found.  Procedures Wound packing Date/Time: 05/13/2018 9:58 AM Performed by: Jacinto Halim, PA-C Authorized by: Jacinto Halim, PA-C  Consent: Verbal consent obtained. Risks and benefits: risks, benefits and alternatives were discussed Consent given by: patient Patient understanding: patient states understanding of the procedure being performed Patient consent: the patient's understanding of the procedure matches consent given Patient identity confirmed: verbally with patient Time out: Immediately prior to procedure a "time out" was called to verify the correct patient, procedure, equipment, support staff and site/side marked as required. Preparation: Patient was prepped and draped in the usual sterile fashion. Local anesthesia used:  no  Anesthesia: Local anesthesia used: no  Sedation: Patient sedated: no  Patient tolerance: Patient tolerated the procedure well with no immediate complications Comments: 1/4 iodoform packing used.     (including critical care time)   Medications Ordered in ED Medications  acetaminophen (TYLENOL) tablet 650 mg (650 mg Oral Given 05/13/18 0914)     Initial Impression / Assessment and Plan / ED Course  I have reviewed the triage vital signs and the nursing notes.  Pertinent labs & imaging results that were available during my care of the patient were reviewed by me and considered in my medical decision making (see chart for details).     27 y.o. female presenting for Bartholin's abscess on the right side recheck. Patient  had incision and drainage, packing and discharged home on Bactrim who presents for wound recheck.   Patient reports the area has been doing well.  There has been continued drainage.  She thinks the packing may have fallen out but she is not sure.  She has been taking her antibiotics as prescribed.  She does note some chills yesterday along with nausea as well as one episode of emesis.  She denies any worsening redness, swelling of the area.  No fever or other infectious symptoms. She did start her menstrual cycle today.  On exam HEENT exam is benign.  No meningeal signs.  Lungs are clear to auscultation bilaterally.  Heart regular rate and rhythm.  No CVA tenderness.  Abdomen soft and nontender.  No peritoneal signs.  GU exam chaperoned and with noted by family is abscess that does not appear to be worsening from prior note.  There is no evidence of cellulitis.  Patient's packing was replaced.  She tolerated the procedure well.  Patient is noted to have a temperature of 100.1 here today.  Vital signs are otherwise unremarkable and without tachycardia, tachypnea, hypoxia or hypotension.  Discussed the case with Dr. Fredderick Phenix. UA has been ordered. If negative can plan for  continued abx at home, OBGYN follow up, and wound recheck in 2 days.   Ua negative. Preg test negative. Will provide a short course of pain medication for the patient. The patient was reviewed in [place] Controlled Substance Reporting System without any discrepancies found. I advised the patient to follow-up with OBGYN this week. She is to return here in 2 days for a wound recheck. Specific return precautions discussed. Time was given for all questions to be answered. The patient verbalized understanding and agreement with plan. The patient appears safe for discharge home.  Final Clinical Impressions(s) / ED Diagnoses   Final diagnoses:  Visit for wound check  Bartholin cyst    ED Discharge Orders         Ordered    HYDROcodone-acetaminophen (NORCO/VICODIN) 5-325 MG tablet  Every 6 hours PRN     05/13/18 1049    ibuprofen (ADVIL,MOTRIN) 600 MG tablet  Every 6 hours PRN     05/13/18 1049    ondansetron (ZOFRAN) 4 MG tablet  Every 8 hours PRN     05/13/18 1052           Jacinto Halim, PA-C 05/13/18 1050    MaczisElmer Sow, PA-C 05/13/18 1053    Rolan Bucco, MD 05/13/18 1428

## 2018-05-13 NOTE — Discharge Instructions (Addendum)
Please follow attached handouts.  Continue antibiotics to completion.  Return in 2 days for packing removal and wound recheck.  Call OBGYN tomorrow to schedule to be seen by the end of the week.   If you develop worsening or new concerning symptoms you can return to the emergency department for re-evaluation.  You have increased pain, swelling, or redness in the area of the cyst. You have an increased amount of puslike drainge from the cyst You have abdominal pain You have a fever >/= 100.4 You vomit   For breakthrough pain you may take Norco. Do not drink alcohol drive or operate heavy machinery when taking. You are being provided a prescription for opiates (also known as narcotics) for pain control on an as needed basis.  Opiates can be addictive and should only be used when absolutely necessary for pain control when other alternatives do not work.  We recommend you only use them for the recommended amount of time and only as prescribed.  Please do not take with other sedative medications or alcohol.  Please do not drive, operate machinery, or make important decisions while taking opiates.  Please note that these medications can be addictive and have high abuse potential.  Please keep these medications locked away from children, teenagers or any family members with history of substance abuse. Additionally, these medications may cause constipation - take over the counter stool softeners or add fiber to your diet to treat this (Metamucil, Psyllium Fiber, Colace, Miralax) Further refills will need to be obtained from your primary care doctor and will not be prescribed through the Emergency Department. You will test positive on most drug tests while taking this medication.

## 2018-05-13 NOTE — ED Notes (Signed)
Pt verbalized understanding of dc papers and Rxs, vss, ambulatory upon discharge

## 2018-05-15 ENCOUNTER — Encounter (HOSPITAL_COMMUNITY): Payer: Self-pay | Admitting: Emergency Medicine

## 2018-05-15 ENCOUNTER — Emergency Department (HOSPITAL_COMMUNITY)
Admission: EM | Admit: 2018-05-15 | Discharge: 2018-05-15 | Disposition: A | Discharge status: Home / Self Care | Payer: Self-pay | Attending: Emergency Medicine | Admitting: Emergency Medicine

## 2018-05-15 ENCOUNTER — Other Ambulatory Visit: Payer: Self-pay

## 2018-05-15 DIAGNOSIS — N750 Cyst of Bartholin's gland: Secondary | ICD-10-CM | POA: Insufficient documentation

## 2018-05-15 DIAGNOSIS — Z87891 Personal history of nicotine dependence: Secondary | ICD-10-CM | POA: Insufficient documentation

## 2018-05-15 DIAGNOSIS — Z79899 Other long term (current) drug therapy: Secondary | ICD-10-CM | POA: Insufficient documentation

## 2018-05-15 MED ORDER — LIDOCAINE HCL (PF) 1 % IJ SOLN
INTRAMUSCULAR | Status: AC
  Filled 2018-05-15: qty 5

## 2018-05-15 NOTE — Discharge Instructions (Addendum)
Please read the attached information.  Please follow-up with your OB/GYN for repeat evaluation, the Word catheter should stay in place for 3 to 4 weeks, this falls out and swelling occurs please follow-up immediately for repeat evaluation.  Please continue using previously prescribed antibiotics.  Return for any new or worsening signs or symptoms.

## 2018-05-15 NOTE — ED Triage Notes (Signed)
Pt has a bartholin cyst. She was seen here last Monday and was told to return for recheck.

## 2018-05-15 NOTE — ED Provider Notes (Signed)
[facility] EMERGENCY DEPARTMENT Provider Note   CSN: [id number] Arrival date & time: 05/15/18  4098     History   Chief Complaint Chief Complaint  Patient presents with  . Abscess    HPI Angelica Blankenship is a 27 y.o. female.  HPI   27 year old female presents today with complaints of Bartholin cyst.  Patient was originally seen on 05/09/1999 2:19 days of swelling to the right labia.  She had I&D performed at that time, she returned again on the eighth, and again on the 10th undergoing I&D and packing.  Patient notes that the wound has again begun to swell.  She was uncertain if they placed packing, and is uncertain if the packing has fallen out.  She denies any fevers, notes that she is currently on antibiotics.  She notes the same presentation approximately 1 year ago.  She has contacted her OB/GYN but has been unable to be seen.  Past Medical History:  Diagnosis Date  . Headache   . Heart murmur    asymptomatic    Patient Active Problem List   Diagnosis Date Noted  . Complete miscarriage 09/12/2014    Past Surgical History:  Procedure Laterality Date  . LAPAROSCOPY N/A 06/18/2015   Procedure: LAPAROSCOPY DIAGNOSTIC;  Surgeon: Maxie Better, MD;  Location: [facility];  Service: Gynecology;  Laterality: N/A;  90 min. requested  . NO PAST SURGERIES    . ROBOTIC ASSISTED DIAGNOSTIC LAPAROSCOPY N/A 06/18/2015   Procedure:  ROBOTIC ASSISTED DIAGNOSTIC LAPAROSCOPY WITH EXCISION OF PELVIC ENDOMETRIOSIS and lysis of adhesions, chromopertubation;  Surgeon: Maxie Better, MD;  Location: [facility];  Service: Gynecology;  Laterality: N/A;     OB History    Gravida  2   Para      Term      Preterm      AB  2   Living        SAB  1   TAB  1   Ectopic      Multiple  0   Live Births               Home Medications    Prior to Admission medications   Medication Sig Start Date End Date Taking? Authorizing Provider    HYDROcodone-acetaminophen (NORCO/VICODIN) 5-325 MG tablet Take 1 tablet by mouth every 6 (six) hours as needed. 05/13/18   Maczis, Elmer Sow, PA-C  ibuprofen (ADVIL,MOTRIN) 600 MG tablet Take 1 tablet (600 mg total) by mouth every 6 (six) hours as needed. 05/13/18   Maczis, Elmer Sow, PA-C  metroNIDAZOLE (FLAGYL) 500 MG tablet Take 1 tablet (500 mg total) by mouth 2 (two) times daily. X 7 days 02/09/17   Hayden Rasmussen, NP  ondansetron (ZOFRAN) 4 MG tablet Take 1 tablet (4 mg total) by mouth every 8 (eight) hours as needed for nausea or vomiting. 05/13/18   Maczis, Elmer Sow, PA-C  sulfamethoxazole-trimethoprim (BACTRIM DS,SEPTRA DS) 800-160 MG tablet Take 1 tablet by mouth 2 (two) times daily for 10 days. 05/08/18 05/18/18  Jeannie Fend, PA-C    Family History No family history on file.  Social History Social History   Tobacco Use  . Smoking status: Former Smoker    Packs/day: 1.00    Types: Cigars    Last attempt to quit: 12/02/2016    Years since quitting: 1.4  . Smokeless tobacco: Never Used  Substance Use Topics  . Alcohol use: Yes    Comment: occasional  . Drug use: Yes  Types: Marijuana    Comment: last use a few months ago     Allergies   Patient has no known allergies.   Review of Systems Review of Systems  All other systems reviewed and are negative.    Physical Exam Updated Vital Signs BP 137/68 (BP Location: Right Arm)   Pulse 97   Temp 99.2 F (37.3 C) (Oral)   Resp 16   LMP 05/13/2018   SpO2 99%   Physical Exam  Constitutional: She is oriented to person, place, and time. She appears well-developed and well-nourished.  HENT:  Head: Normocephalic and atraumatic.  Eyes: Pupils are equal, round, and reactive to light. Conjunctivae are normal. Right eye exhibits no discharge. Left eye exhibits no discharge. No scleral icterus.  Neck: Normal range of motion. No JVD present. No tracheal deviation present.  Pulmonary/Chest: Effort normal. No stridor.   Genitourinary:     Genitourinary Comments: Large area of fluctuance noted on the right labia, no drainage noted-internal exam shows no extension in the vaginal canal  Neurological: She is alert and oriented to person, place, and time. Coordination normal.  Psychiatric: She has a normal mood and affect. Her behavior is normal. Judgment and thought content normal.  Nursing note and vitals reviewed.   ED Treatments / Results  Labs (all labs ordered are listed, but only abnormal results are displayed) Labs Reviewed - No data to display  EKG None  Radiology No results found.  Procedures .Marland Kitchen.Incision and Drainage Date/Time: 05/15/2018 11:35 AM Performed by: Eyvonne MechanicHedges, Corrissa Martello, PA-C Authorized by: Eyvonne MechanicHedges, Ledia Hanford, PA-C   Consent:    Consent obtained:  Verbal   Consent given by:  Patient   Risks discussed:  Bleeding, incomplete drainage, infection and pain   Alternatives discussed:  No treatment and alternative treatment Location:    Type:  Bartholin cyst   Size:  3   Location: Right labia. Pre-procedure details:    Skin preparation:  Betadine Anesthesia (see MAR for exact dosages):    Anesthesia method:  Local infiltration   Local anesthetic:  Lidocaine 2% w/o epi Procedure type:    Complexity:  Simple Procedure details:    Needle aspiration: no     Incision types:  Stab incision   Incision depth:  Dermal   Scalpel blade:  11   Wound management:  Probed and deloculated and irrigated with saline   Drainage:  Purulent   Drainage amount:  Copious   Wound treatment:  Drain placed   Packing materials:  Word catheter Post-procedure details:    Patient tolerance of procedure:  Tolerated well, no immediate complications   (including critical care time)  Medications Ordered in ED Medications  lidocaine (PF) (XYLOCAINE) 1 % injection (has no administration in time range)     Initial Impression / Assessment and Plan / ED Course  I have reviewed the triage vital signs and  the nursing notes.  Pertinent labs & imaging results that were available during my care of the patient were reviewed by me and considered in my medical decision making (see chart for details).      27 year old female presents today with Bartholin cyst.  I&D successful here, wound was copiously irrigated.  Patient notes that she is uncertain if the packing had fallen out.  I was unable to locate any packing in the abscess.  Word catheter was successfully placed and inflated with 2 cc of saline.  Patient has no surrounding erythema, she will continue using antibiotics, she will follow-up with her  OB/GYN for reevaluation, if her Word catheter has not been removed in 3 to 4 weeks she will return for removal.  She will return immediately with any new or worsening signs or symptoms.  She verbalized understanding and agreement to today's plan had no further questions or concerns.    Final Clinical Impressions(s) / ED Diagnoses   Final diagnoses:  Bartholin cyst    ED Discharge Orders    None       Rosalio Loud 05/15/18 1139    Tilden Fossa, MD 05/16/18 [id number]

## 2018-05-16 ENCOUNTER — Encounter (HOSPITAL_COMMUNITY): Payer: Self-pay | Admitting: Emergency Medicine

## 2018-05-16 ENCOUNTER — Other Ambulatory Visit: Payer: Self-pay

## 2018-05-16 ENCOUNTER — Emergency Department (HOSPITAL_COMMUNITY)
Admission: EM | Admit: 2018-05-16 | Discharge: 2018-05-16 | Disposition: A | Discharge status: Home / Self Care | Payer: Self-pay | Attending: Emergency Medicine | Admitting: Emergency Medicine

## 2018-05-16 DIAGNOSIS — B9689 Other specified bacterial agents as the cause of diseases classified elsewhere: Secondary | ICD-10-CM | POA: Insufficient documentation

## 2018-05-16 DIAGNOSIS — N751 Abscess of Bartholin's gland: Secondary | ICD-10-CM | POA: Insufficient documentation

## 2018-05-16 DIAGNOSIS — Z79899 Other long term (current) drug therapy: Secondary | ICD-10-CM | POA: Insufficient documentation

## 2018-05-16 DIAGNOSIS — N760 Acute vaginitis: Secondary | ICD-10-CM | POA: Insufficient documentation

## 2018-05-16 DIAGNOSIS — Z87891 Personal history of nicotine dependence: Secondary | ICD-10-CM | POA: Insufficient documentation

## 2018-05-16 LAB — URINALYSIS, ROUTINE W REFLEX MICROSCOPIC
Bilirubin Urine: NEGATIVE
Glucose, UA: NEGATIVE mg/dL
Ketones, ur: 5 mg/dL — AB
Leukocytes, UA: NEGATIVE
Nitrite: NEGATIVE
Protein, ur: 30 mg/dL — AB
Specific Gravity, Urine: 1.025 (ref 1.005–1.030)
pH: 6 (ref 5.0–8.0)

## 2018-05-16 LAB — WET PREP, GENITAL
Sperm: NONE SEEN
Trich, Wet Prep: NONE SEEN
Yeast Wet Prep HPF POC: NONE SEEN

## 2018-05-16 MED ORDER — LIDOCAINE HCL 2 % IJ SOLN
20.0000 mL | Freq: Once | INTRAMUSCULAR | Status: AC
  Administered 2018-05-16: 400 mg
  Filled 2018-05-16: qty 20

## 2018-05-16 MED ORDER — METRONIDAZOLE 500 MG PO TABS: 500.0000 mg | ORAL_TABLET | Freq: Two times a day (BID) | ORAL | 0 refills | Status: DC

## 2018-05-16 NOTE — ED Notes (Signed)
Patient verbalizes understanding of discharge instructions. Opportunity for questioning and answers were provided. Armband removed by staff, pt discharged from ED ambulatory.   

## 2018-05-16 NOTE — Discharge Instructions (Signed)
You were seen in the ER for vaginal itching and discharge.  Swabs showed bacterial vaginosis which commonly happen with antibiotics.  Treatment includes Flagyl twice a day for the next 7 days.  Rinse vaginal area with just water and avoid any fragrance or irritating cleaning products.  Do not mix Flagyl with alcohol as it will cause severe nausea, vomiting, abdominal pain.  Continue all medications including antibiotics as prescribed.

## 2018-05-16 NOTE — ED Triage Notes (Signed)
Pt reports vaginal itching started today . Tuesday Pt had a wert cath placed for cyst. Pt reports swelling has improved

## 2018-05-16 NOTE — ED Provider Notes (Signed)
[facility] EMERGENCY DEPARTMENT Provider Note   CSN: [id number] Arrival date & time: 05/16/18  1343     History   Chief Complaint Chief Complaint  Patient presents with  . Vaginal Itching    HPI Montzerrat Brunell is a 27 y.o. female with h/o Bartholin's gland abscess s/p I&D, packing and recent Ward catheter insertion yesterday in ER presents to ER for vaginal itching, vaginal discharge onset last night worsening this morning.  Reports itching is intense, intermittent.  Has been compliant with antibiotics. Ward catheter remains in place and she has not noticed any increased pain, swelling, redness, discharge from this area. It is still mildly tender with direct palpation. Her LMP began 4 days ago and is still having menses.  No new recent sexual partners or encounters. She is not concerned about pregnancy or STD today. This is patient's 5th ER visit in the last 8 days.  She denies fevers, chills, dysuria, urgency, frequency.   HPI  Past Medical History:  Diagnosis Date  . Headache   . Heart murmur    asymptomatic    Patient Active Problem List   Diagnosis Date Noted  . Complete miscarriage 09/12/2014    Past Surgical History:  Procedure Laterality Date  . LAPAROSCOPY N/A 06/18/2015   Procedure: LAPAROSCOPY DIAGNOSTIC;  Surgeon: Maxie Better, MD;  Location: [facility];  Service: Gynecology;  Laterality: N/A;  90 min. requested  . NO PAST SURGERIES    . ROBOTIC ASSISTED DIAGNOSTIC LAPAROSCOPY N/A 06/18/2015   Procedure:  ROBOTIC ASSISTED DIAGNOSTIC LAPAROSCOPY WITH EXCISION OF PELVIC ENDOMETRIOSIS and lysis of adhesions, chromopertubation;  Surgeon: Maxie Better, MD;  Location: [facility];  Service: Gynecology;  Laterality: N/A;     OB History    Gravida  2   Para      Term      Preterm      AB  2   Living        SAB  1   TAB  1   Ectopic      Multiple  0   Live Births               Home Medications    Prior to Admission  medications   Medication Sig Start Date End Date Taking? Authorizing Provider  HYDROcodone-acetaminophen (NORCO/VICODIN) 5-325 MG tablet Take 1 tablet by mouth every 6 (six) hours as needed. 05/13/18   Maczis, Elmer Sow, PA-C  ibuprofen (ADVIL,MOTRIN) 600 MG tablet Take 1 tablet (600 mg total) by mouth every 6 (six) hours as needed. 05/13/18   Maczis, Elmer Sow, PA-C  metroNIDAZOLE (FLAGYL) 500 MG tablet Take 1 tablet (500 mg total) by mouth 2 (two) times daily. 05/16/18   Liberty Handy, PA-C  ondansetron (ZOFRAN) 4 MG tablet Take 1 tablet (4 mg total) by mouth every 8 (eight) hours as needed for nausea or vomiting. 05/13/18   Maczis, Elmer Sow, PA-C  sulfamethoxazole-trimethoprim (BACTRIM DS,SEPTRA DS) 800-160 MG tablet Take 1 tablet by mouth 2 (two) times daily for 10 days. 05/08/18 05/18/18  Jeannie Fend, PA-C    Family History History reviewed. No pertinent family history.  Social History Social History   Tobacco Use  . Smoking status: Former Smoker    Packs/day: 1.00    Types: Cigars    Last attempt to quit: 12/02/2016    Years since quitting: 1.4  . Smokeless tobacco: Never Used  Substance Use Topics  . Alcohol use: Yes    Comment: occasional  .  Drug use: Yes    Types: Marijuana    Comment: last use a few months ago     Allergies   Patient has no known allergies.   Review of Systems Review of Systems  Genitourinary: Positive for vaginal discharge and vaginal pain (right bartholonin glad pain).       Vaginal itching   All other systems reviewed and are negative.    Physical Exam Updated Vital Signs BP (!) 141/87 (BP Location: Left Arm)   Pulse 85   Temp 98.6 F (37 C) (Oral)   Resp 14   Ht 5\' 6"  (1.676 m)   Wt 63 kg   LMP 05/13/2018   SpO2 99%   BMI 22.42 kg/m   Physical Exam  Constitutional: She is oriented to person, place, and time. She appears well-developed and well-nourished.  Non toxic  HENT:  Head: Normocephalic and atraumatic.  Nose:  Nose normal.  Eyes: Pupils are equal, round, and reactive to light. Conjunctivae and EOM are normal.  Neck: Normal range of motion.  Cardiovascular: Normal rate and regular rhythm.  Pulmonary/Chest: Effort normal and breath sounds normal.  Abdominal: Soft. Bowel sounds are normal. There is no tenderness.  No G/R/R. No suprapubic or CVA tenderness. Negative Murphy's and McBurney's. Active BS to lower quadrants.   Genitourinary:  Genitourinary Comments: Exam performed with EMT at bedside for assistance. Ward catheter in place in right inferior labia.  Mild tenderness immediately over ward catheter incision but no surrounding local erythema, fluctuance, edema.No groin lymphadenopathy.  Vaginal mucosa and cervix pink without lesions.  Scant dark blood in vaginal vault noted (current menses). Positive fishy whiff test noted.  No CMT.  Nonpalpable, nontender adnexa.  Perianal skin normal without lesions.  Musculoskeletal: Normal range of motion.  Neurological: She is alert and oriented to person, place, and time.  Skin: Skin is warm and dry. Capillary refill takes less than 2 seconds.  Psychiatric: She has a normal mood and affect. Her behavior is normal.  Nursing note and vitals reviewed.    ED Treatments / Results  Labs (all labs ordered are listed, but only abnormal results are displayed) Labs Reviewed  WET PREP, GENITAL - Abnormal; Notable for the following components:      Result Value   Clue Cells Wet Prep HPF POC PRESENT (*)    WBC, Wet Prep HPF POC MODERATE (*)    All other components within normal limits  URINALYSIS, ROUTINE W REFLEX MICROSCOPIC - Abnormal; Notable for the following components:   APPearance HAZY (*)    Hgb urine dipstick MODERATE (*)    Ketones, ur 5 (*)    Protein, ur 30 (*)    Bacteria, UA RARE (*)    All other components within normal limits  URINE CULTURE  GC/CHLAMYDIA PROBE AMP ([place]) NOT AT [facility]    EKG None  Radiology No results  found.  Procedures Word catheter placed Date/Time: 05/16/2018 8:35 PM Performed by: Liberty Handy, PA-C Authorized by: Liberty Handy, PA-C  Consent: Verbal consent obtained. Written consent not obtained. Risks and benefits: risks, benefits and alternatives were discussed Consent given by: patient Patient understanding: patient states understanding of the procedure being performed Patient consent: the patient's understanding of the procedure matches consent given Procedure consent: procedure consent matches procedure scheduled Required items: required blood products, implants, devices, and special equipment available Patient identity confirmed: arm band Time out: Immediately prior to procedure a "time out" was called to verify the correct patient, procedure,  equipment, support staff and site/side marked as required. Local anesthesia used: no  Anesthesia: Local anesthesia used: no  Sedation: Patient sedated: no  Patient tolerance: Patient tolerated the procedure well with no immediate complications Comments: Word catheter noted to right inferior labia.  2 cc fluid removed from balloon and catheter easily removed with approximately 1 cm incision noted at right inferior labia.  Incision drained scant clear fluid, but no blood or purulence drainage.  Mild tenderness locally over incision but area is basically flat, comparable to opposite side. Bent hemostats inserted multiple times in all quadrants of cavity without packing noted or removed.  10 cc pressure irrigation into wound performed without bleeding or purulence.  No packing material was palpated, visualized or removed.  Same word catheter was irrigated and replaced with 2 cc used in balloon, slight tug to confirm placement and confirm catheter is secured.  No tenderness with bimanual/internal pelvic exam and palpation of right internal vaginal vault or mucosa. Patient tolerated procedure without local anesthetic.      (including critical care time)  Medications Ordered in ED Medications  lidocaine (XYLOCAINE) 2 % (with pres) injection 400 mg (400 mg Infiltration Given 05/16/18 1501)     Initial Impression / Assessment and Plan / ED Course  I have reviewed the triage vital signs and the nursing notes.  Pertinent labs & imaging results that were available during my care of the patient were reviewed by me and considered in my medical decision making (see chart for details).     27 yo with vaginal itching in setting of new oral antibiotics for bartholin's gland abscess.  +clue cells, positive whiff test consistent with BV vaginitis.  GC/chlamydia swabs pending, she declined empiric STD testing today. She has not had sexual encounters or new partners lately.  Urine pregnancy negative 3 days ago and no need for repeat today.  UA without obvious signs of infection today, I suspect her rare bacteria and WBC and mucus likely from BV, urine culture sent.  There was concern for packing material that was missing.  Per ER visit on 11/10, packing material was placed. Pt states she had tape on her leg but never saw the packing material and is not sure if it fell out or not.  extensive irrigation of cavity today and exploration with hemostats did not reveal remaining packing material or purulence, her abscess is actually healing very well and I doubt there is packing material remaining in such small cavity.  I discussed this with pt and she is comfortable with this.  Will dc with flagyl.  She is to continue abx prescribed to her previously.  Word catheter to remain in place for 3-4 weeks or until it falls out per previous ER provider recommendations.  Return precautions discussed. Pt is comfortable with plan.   Final Clinical Impressions(s) / ED Diagnoses   Final diagnoses:  BV (bacterial vaginosis)    ED Discharge Orders         Ordered    metroNIDAZOLE (FLAGYL) 500 MG tablet  2 times daily     05/16/18 1722            Jerrell MylarGibbons, Halton Neas J, PA-C 05/16/18 2046    Gwyneth SproutPlunkett, Whitney, MD 05/22/18 1555

## 2018-05-16 NOTE — ED Notes (Signed)
Pt reports vaginal itching and pain. Pt also on menstrual period.

## 2018-05-17 LAB — URINE CULTURE: Culture: <10000 — AB

## 2018-05-17 LAB — GC/CHLAMYDIA PROBE AMP (~~LOC~~) NOT AT ARMC
Chlamydia: NEGATIVE
Neisseria Gonorrhea: NEGATIVE

## 2018-06-05 ENCOUNTER — Other Ambulatory Visit: Payer: Self-pay | Admitting: Obstetrics

## 2018-08-03 ENCOUNTER — Encounter: Payer: Self-pay | Admitting: Internal Medicine

## 2018-08-03 ENCOUNTER — Ambulatory Visit: Payer: Self-pay | Attending: Internal Medicine | Admitting: Internal Medicine

## 2018-08-03 VITALS — BP 128/81 | HR 68 | Temp 98.4°F | Resp 16 | Ht 66.0 in | Wt 143.4 lb

## 2018-08-03 DIAGNOSIS — Z2821 Immunization not carried out because of patient refusal: Secondary | ICD-10-CM

## 2018-08-03 DIAGNOSIS — F1290 Cannabis use, unspecified, uncomplicated: Secondary | ICD-10-CM

## 2018-08-03 DIAGNOSIS — R030 Elevated blood-pressure reading, without diagnosis of hypertension: Secondary | ICD-10-CM

## 2018-08-03 DIAGNOSIS — R809 Proteinuria, unspecified: Secondary | ICD-10-CM

## 2018-08-03 NOTE — Patient Instructions (Signed)
I encourage you to discontinue smoking marijuana.  I would recommend using ibuprofen as needed for pelvic pain and menstrual cramps.  Please get the forms for the orange card/cone discount card and fill them out and return them to us.

## 2018-08-03 NOTE — Progress Notes (Signed)
Patient ID: Angelica Blankenship, female    DOB: 05/26/1991  MRN: [id number]  CC: New Patient (Initial Visit)   Subjective: Angelica Blankenship is a 28 y.o. female who presents for new pt visit Her concerns today include:   No previous primary.  Came to see if she can get assistance with paying medical bills. Was seen in ER several times in 11/02019 for abscess associated with Bartholin cyst and has accumulated medical bills in regards to those visits.  UA done through the ER revealed some protein in the urine x 2.  Patient told to follow-up on this.  She does not have any chronic medical issues like diabetes or hypertension.  She does not exercise regularly. Last pap was done by her gynecologist at [facility] on [address] less than 3 yrs ago.  She reports that it was normal.  She smokes marijuana daily which she states helps with chronic pelvic pain from endometriosis and menstrual cramps.  Family history, social history and surgical histories reviewed. Patient Active Problem List   Diagnosis Date Noted  . Complete miscarriage 09/12/2014     Current Outpatient Medications on File Prior to Visit  Medication Sig Dispense Refill  . HYDROcodone-acetaminophen (NORCO/VICODIN) 5-325 MG tablet Take 1 tablet by mouth every 6 (six) hours as needed. (Patient not taking: Reported on 08/03/2018) 8 tablet 0  . ibuprofen (ADVIL,MOTRIN) 600 MG tablet Take 1 tablet (600 mg total) by mouth every 6 (six) hours as needed. (Patient not taking: Reported on 08/03/2018) 30 tablet 0  . metroNIDAZOLE (FLAGYL) 500 MG tablet Take 1 tablet (500 mg total) by mouth 2 (two) times daily. (Patient not taking: Reported on 08/03/2018) 14 tablet 0  . ondansetron (ZOFRAN) 4 MG tablet Take 1 tablet (4 mg total) by mouth every 8 (eight) hours as needed for nausea or vomiting. (Patient not taking: Reported on 08/03/2018) 4 tablet 0   No current facility-administered medications on file prior to visit.     No Known Allergies  Social History     Socioeconomic History  . Marital status: Single    Spouse name: Not on file  . Number of children: 0  . Years of education: Not on file  . Highest education level: Not on file  Occupational History  . Not on file  Social Needs  . Financial resource strain: Not on file  . Food insecurity:    Worry: Not on file    Inability: Not on file  . Transportation needs:    Medical: Not on file    Non-medical: Not on file  Tobacco Use  . Smoking status: Former Smoker    Packs/day: 1.00    Types: Cigars    Last attempt to quit: 12/02/2016    Years since quitting: 1.6  . Smokeless tobacco: Never Used  Substance and Sexual Activity  . Alcohol use: Yes    Comment: occasional  . Drug use: Yes    Frequency: 7.0 times per week    Types: Marijuana    Comment: uses daily  . Sexual activity: Yes    Birth control/protection: None  Lifestyle  . Physical activity:    Days per week: Not on file    Minutes per session: Not on file  . Stress: Not on file  Relationships  . Social connections:    Talks on phone: Not on file    Gets together: Not on file    Attends religious service: Not on file    Active member of club  or organization: Not on file    Attends meetings of clubs or organizations: Not on file    Relationship status: Not on file  . Intimate partner violence:    Fear of current or ex partner: Not on file    Emotionally abused: Not on file    Physically abused: Not on file    Forced sexual activity: Not on file  Other Topics Concern  . Not on file  Social History Narrative  . Not on file    Family History  Problem Relation Age of Onset  . Hypertension Mother   . Hypertension Sister   . Breast cancer Maternal Aunt   . Hypertension Maternal Grandmother   . Cervical cancer Maternal Grandmother     Past Surgical History:  Procedure Laterality Date  . LAPAROSCOPY N/A 06/18/2015   Procedure: LAPAROSCOPY DIAGNOSTIC;  Surgeon: Maxie Better, MD;  Location: [facility];   Service: Gynecology;  Laterality: N/A;  90 min. requested  . NO PAST SURGERIES    . ROBOTIC ASSISTED DIAGNOSTIC LAPAROSCOPY N/A 06/18/2015   Procedure:  ROBOTIC ASSISTED DIAGNOSTIC LAPAROSCOPY WITH EXCISION OF PELVIC ENDOMETRIOSIS and lysis of adhesions, chromopertubation;  Surgeon: Maxie Better, MD;  Location: [facility];  Service: Gynecology;  Laterality: N/A;    ROS: Review of Systems Negative except as above. PHYSICAL EXAM: BP 128/81   Pulse 68   Temp 98.4 F (36.9 C) (Oral)   Resp 16   Ht 5\' 6"  (1.676 m)   Wt 143 lb 6.4 oz (65 kg)   LMP 07/29/2018   SpO2 99%   BMI 23.15 kg/m   Physical Exam  General appearance - alert, well appearing, and in no distress Mental status - normal mood, behavior, speech, dress, motor activity, and thought processes Nose - normal and patent, no erythema, discharge or polyps Mouth - mucous membranes moist, pharynx normal without lesions Chest - clear to auscultation, no wheezes, rales or rhonchi, symmetric air entry Heart - normal rate, regular rhythm, normal S1, S2, no murmurs, rubs, clicks or gallops Extremities - peripheral pulses normal, no pedal edema, no clubbing or cyanosis   ASSESSMENT AND PLAN:  1. Proteinuria, unspecified type May have been due to the genitourinary infection she had going on at the time.  We will check a urine today for microalbumin - Microalbumin / creatinine urine ratio  2. Marijuana smoker Discouraged use.  Encourage use of ibuprofen instead for pelvic pain and menstrual cramps.  3.  Elevated blood pressure I went over what is a normal blood pressure reading.  DASH diet discussed and encouraged.  4.  Declined influenza vaccine  Patient was given the opportunity to ask questions.  Patient verbalized understanding of the plan and was able to repeat key elements of the plan.   Orders Placed This Encounter  Procedures  . Microalbumin / creatinine urine ratio     Requested Prescriptions    No  prescriptions requested or ordered in this encounter    Return if symptoms worsen or fail to improve.  Jonah Blue, MD, FACP

## 2018-08-04 LAB — MICROALBUMIN / CREATININE URINE RATIO
Creatinine, Urine: 463.9 mg/dL
Microalb/Creat Ratio: 3 mg/g creat (ref 0–29)
Microalbumin, Urine: 14.8 ug/mL

## 2018-10-02 ENCOUNTER — Other Ambulatory Visit: Payer: Self-pay | Admitting: Internal Medicine

## 2018-11-19 ENCOUNTER — Other Ambulatory Visit: Payer: Self-pay | Admitting: Internal Medicine

## 2019-04-25 ENCOUNTER — Ambulatory Visit: Payer: Self-pay

## 2019-04-25 ENCOUNTER — Ambulatory Visit
Admission: EM | Admit: 2019-04-25 | Discharge: 2019-04-25 | Disposition: A | Discharge status: Home / Self Care | Payer: Self-pay | Attending: Physician Assistant | Admitting: Physician Assistant

## 2019-04-25 ENCOUNTER — Encounter: Payer: Self-pay | Admitting: Emergency Medicine

## 2019-04-25 ENCOUNTER — Other Ambulatory Visit: Payer: Self-pay

## 2019-04-25 DIAGNOSIS — B373 Candidiasis of vulva and vagina: Secondary | ICD-10-CM

## 2019-04-25 DIAGNOSIS — N760 Acute vaginitis: Secondary | ICD-10-CM

## 2019-04-25 DIAGNOSIS — Z113 Encounter for screening for infections with a predominantly sexual mode of transmission: Secondary | ICD-10-CM | POA: Insufficient documentation

## 2019-04-25 DIAGNOSIS — B9689 Other specified bacterial agents as the cause of diseases classified elsewhere: Secondary | ICD-10-CM

## 2019-04-25 LAB — POCT URINALYSIS DIP (MANUAL ENTRY)
Bilirubin, UA: NEGATIVE
Blood, UA: NEGATIVE
Glucose, UA: NEGATIVE mg/dL
Leukocytes, UA: NEGATIVE
Nitrite, UA: NEGATIVE
Protein Ur, POC: NEGATIVE mg/dL
Spec Grav, UA: >=1.03 — AB (ref 1.010–1.025)
Urobilinogen, UA: 0.2 E.U./dL
pH, UA: 6 (ref 5.0–8.0)

## 2019-04-25 LAB — POCT URINE PREGNANCY: Preg Test, Ur: NEGATIVE

## 2019-04-25 MED ORDER — FLUCONAZOLE 150 MG PO TABS: 150.0000 mg | ORAL_TABLET | Freq: Every day | ORAL | 0 refills | Status: DC

## 2019-04-25 MED ORDER — METRONIDAZOLE 500 MG PO TABS: 500.0000 mg | ORAL_TABLET | Freq: Two times a day (BID) | ORAL | 0 refills | Status: DC

## 2019-04-25 NOTE — Discharge Instructions (Signed)
You were treated empirically for BV and yeast. Start diflucan and flagyl as directed. Testing sent, you will be contacted with any positive results that requires further treatment. Refrain from sexual activity and alcohol use for the next 7 days. Monitor for any worsening of symptoms, fever, abdominal pain, nausea, vomiting, to follow up for reevaluation.

## 2019-04-25 NOTE — ED Triage Notes (Signed)
Pt presents to [facility] for 2 weeks of urinary frequency, vaginal discharge, hx of BV.

## 2019-04-25 NOTE — ED Provider Notes (Signed)
[name]    CSN: [id number] Arrival date & time: 04/25/19  1104      History   Chief Complaint Chief Complaint  Patient presents with  . Dysuria    HPI Angelica Blankenship is a 28 y.o. female.   28 year old female comes in for 2 week history of urinary symptoms and vaginal symptoms.  Has had dysuria, urinary frequency.  Denies hematuria.  Has a vaginal discharge which is white and creamy, denies odor.  Has had some vaginal itching/irritation intermittently.  Denies fever, chills, body aches.  Denies abdominal pain, nausea, vomiting.  Sexually active with one female partner, no condom use.  No birth control use.  LMP 04/01/2019.     Past Medical History:  Diagnosis Date  . Headache   . Heart murmur    asymptomatic    Patient Active Problem List   Diagnosis Date Noted  . Complete miscarriage 09/12/2014    Past Surgical History:  Procedure Laterality Date  . LAPAROSCOPY N/A 06/18/2015   Procedure: LAPAROSCOPY DIAGNOSTIC;  Surgeon: Maxie BetterSheronette Cousins, MD;  Location: [facility];  Service: Gynecology;  Laterality: N/A;  90 min. requested  . NO PAST SURGERIES    . ROBOTIC ASSISTED DIAGNOSTIC LAPAROSCOPY N/A 06/18/2015   Procedure:  ROBOTIC ASSISTED DIAGNOSTIC LAPAROSCOPY WITH EXCISION OF PELVIC ENDOMETRIOSIS and lysis of adhesions, chromopertubation;  Surgeon: Maxie BetterSheronette Cousins, MD;  Location: [facility];  Service: Gynecology;  Laterality: N/A;    OB History    Gravida  2   Para      Term      Preterm      AB  2   Living        SAB  1   TAB  1   Ectopic      Multiple  0   Live Births               Home Medications    Prior to Admission medications   Medication Sig Start Date End Date Taking? Authorizing Provider  fluconazole (DIFLUCAN) 150 MG tablet Take 1 tablet (150 mg total) by mouth daily. Take second dose 72 hours later if symptoms still persists. 04/25/19   Cathie HoopsYu, Everitt Wenner V, PA-C  metroNIDAZOLE (FLAGYL) 500 MG tablet Take 1 tablet (500 mg  total) by mouth 2 (two) times daily. 04/25/19   Belinda FisherYu, Mattilynn Forrer V, PA-C    Family History Family History  Problem Relation Age of Onset  . Hypertension Mother   . Hypertension Sister   . Breast cancer Maternal Aunt   . Hypertension Maternal Grandmother   . Cervical cancer Maternal Grandmother     Social History Social History   Tobacco Use  . Smoking status: Former Smoker    Packs/day: 1.00    Types: Cigars    Quit date: 12/02/2016    Years since quitting: 2.3  . Smokeless tobacco: Never Used  Substance Use Topics  . Alcohol use: Yes    Comment: occasional  . Drug use: Yes    Frequency: 7.0 times per week    Types: Marijuana    Comment: uses daily     Allergies   Patient has no known allergies.   Review of Systems Review of Systems  Reason unable to perform ROS: See HPI as above.     Physical Exam Triage Vital Signs ED Triage Vitals [04/25/19 1119]  Enc Vitals Group     BP 133/84     Pulse Rate 70     Resp 16  Temp 98.9 F (37.2 C)     Temp Source Oral     SpO2 98 %     Weight      Height      Head Circumference      Peak Flow      Pain Score 0     Pain Loc      Pain Edu?      Excl. in [place]?    No data found.  Updated Vital Signs BP 133/84 (BP Location: Left Arm)   Pulse 70   Temp 98.9 F (37.2 C) (Oral)   Resp 16   LMP 04/01/2019   SpO2 98%   Physical Exam Constitutional:      General: She is not in acute distress.    Appearance: She is well-developed. She is not ill-appearing, toxic-appearing or diaphoretic.  HENT:     Head: Normocephalic and atraumatic.  Eyes:     Conjunctiva/sclera: Conjunctivae normal.     Pupils: Pupils are equal, round, and reactive to light.  Cardiovascular:     Rate and Rhythm: Normal rate and regular rhythm.     Heart sounds: Normal heart sounds. No murmur. No friction rub. No gallop.   Pulmonary:     Effort: Pulmonary effort is normal.     Breath sounds: Normal breath sounds. No wheezing or rales.  Abdominal:      General: Bowel sounds are normal.     Palpations: Abdomen is soft.     Tenderness: There is no abdominal tenderness. There is no right CVA tenderness, left CVA tenderness, guarding or rebound.  Skin:    General: Skin is warm and dry.  Neurological:     Mental Status: She is alert and oriented to person, place, and time.  Psychiatric:        Behavior: Behavior normal.        Judgment: Judgment normal.      UC Treatments / Results  Labs (all labs ordered are listed, but only abnormal results are displayed) Labs Reviewed  POCT URINALYSIS DIP (MANUAL ENTRY) - Abnormal; Notable for the following components:      Result Value   Ketones, POC UA trace (5) (*)    Spec Grav, UA >=1.030 (*)    All other components within normal limits  POCT URINE PREGNANCY - Normal  HIV ANTIBODY (ROUTINE TESTING W REFLEX)  RPR  CERVICOVAGINAL ANCILLARY ONLY    EKG   Radiology No results found.  Procedures Procedures (including critical care time)  Medications Ordered in UC Medications - No data to display  Initial Impression / Assessment and Plan / UC Course  I have reviewed the triage vital signs and the nursing notes.  Pertinent labs & imaging results that were available during my care of the patient were reviewed by me and considered in my medical decision making (see chart for details).    Discussed urine dipstick results with patient.  Will have patient push fluids at this time and continue to monitor.  We will treat empirically for BV and yeast per patient's request.  Patient also requesting HIV and syphilis testing.  Cytology, HIV, RPR sent.  Patient to refrain from sexual activity and alcohol use for the next 7 days.  Return precautions given.  Patient expresses understanding and agrees to plan.  Final Clinical Impressions(s) / UC Diagnoses   Final diagnoses:  Screen for STD (sexually transmitted disease)   ED Prescriptions    Medication Sig Dispense Auth. Provider    metroNIDAZOLE (  FLAGYL) 500 MG tablet Take 1 tablet (500 mg total) by mouth 2 (two) times daily. 14 tablet Finn Amos V, PA-C   fluconazole (DIFLUCAN) 150 MG tablet Take 1 tablet (150 mg total) by mouth daily. Take second dose 72 hours later if symptoms still persists. 2 tablet Belinda Fisher, PA-C     PDMP not reviewed this encounter.   Belinda Fisher, PA-C 04/25/19 1210

## 2019-04-25 NOTE — ED Notes (Signed)
Patient able to ambulate independently  

## 2019-04-26 LAB — HIV ANTIBODY (ROUTINE TESTING W REFLEX): HIV Screen 4th Generation wRfx: NONREACTIVE

## 2019-04-26 LAB — RPR: RPR Ser Ql: NONREACTIVE

## 2019-04-29 ENCOUNTER — Telehealth (HOSPITAL_COMMUNITY): Payer: Self-pay | Admitting: Emergency Medicine

## 2019-04-29 LAB — CERVICOVAGINAL ANCILLARY ONLY
Chlamydia: NEGATIVE
Neisseria Gonorrhea: NEGATIVE
Trichomonas: POSITIVE — AB

## 2019-04-29 NOTE — Telephone Encounter (Signed)
Trichomonas is positive. Rx metronidazole was given at the urgent care visit. Pt needs education to please refrain from sexual intercourse for 7 days to give the medicine time to work. Sexual partners need to be notified and tested/treated. Condoms may reduce risk of reinfection. Recheck for further evaluation if symptoms are not improving.   Patient contacted and made aware of    results, all questions answered   

## 2019-06-17 ENCOUNTER — Encounter: Payer: Self-pay | Admitting: Emergency Medicine

## 2019-06-17 ENCOUNTER — Ambulatory Visit
Admission: EM | Admit: 2019-06-17 | Discharge: 2019-06-17 | Disposition: A | Discharge status: Home / Self Care | Payer: PRIVATE HEALTH INSURANCE | Attending: Emergency Medicine | Admitting: Emergency Medicine

## 2019-06-17 ENCOUNTER — Other Ambulatory Visit: Payer: Self-pay

## 2019-06-17 DIAGNOSIS — N898 Other specified noninflammatory disorders of vagina: Secondary | ICD-10-CM | POA: Diagnosis not present

## 2019-06-17 DIAGNOSIS — Z7251 High risk heterosexual behavior: Secondary | ICD-10-CM

## 2019-06-17 NOTE — ED Notes (Signed)
Patient able to ambulate independently  

## 2019-06-17 NOTE — ED Triage Notes (Signed)
Pt presents to [facility] for assessment after 2-3 days of yellow vaginal discharge.  Patient denies any lower abdominal pain.

## 2019-06-17 NOTE — Discharge Instructions (Signed)

## 2019-06-17 NOTE — ED Provider Notes (Signed)
[name]    CSN: [id number] Arrival date & time: 06/17/19  1728      History   Chief Complaint Chief Complaint  Patient presents with  . Vaginal Discharge    HPI Angelica Blankenship is a 28 y.o. female presenting for 2 to 3-day course of vaginal discharge.  States it is yellow in color: Denies malodor, vaginal pelvic pain.  No anogenital lesions.  Patient does endorse history of BV, states this does not require testing.  Currently sexually active with 1 female partner, not routinely using condoms.  Denies urinary symptoms, pelvic or abdominal pain.  Of note, patient tested positive on 04/25/2019 for trichomonas, underwent antibiotic course shortly thereafter without adverse effect.  LMP 05/27/2019: Low concern for pregnancy at this time.  Past Medical History:  Diagnosis Date  . Headache   . Heart murmur    asymptomatic    Patient Active Problem List   Diagnosis Date Noted  . Complete miscarriage 09/12/2014    Past Surgical History:  Procedure Laterality Date  . LAPAROSCOPY N/A 06/18/2015   Procedure: LAPAROSCOPY DIAGNOSTIC;  Surgeon: Servando Salina, MD;  Location: [facility];  Service: Gynecology;  Laterality: N/A;  90 min. requested  . NO PAST SURGERIES    . ROBOTIC ASSISTED DIAGNOSTIC LAPAROSCOPY N/A 06/18/2015   Procedure:  ROBOTIC ASSISTED DIAGNOSTIC LAPAROSCOPY WITH EXCISION OF PELVIC ENDOMETRIOSIS and lysis of adhesions, chromopertubation;  Surgeon: Servando Salina, MD;  Location: [facility];  Service: Gynecology;  Laterality: N/A;    OB History    Gravida  2   Para      Term      Preterm      AB  2   Living        SAB  1   TAB  1   Ectopic      Multiple  0   Live Births               Home Medications    Prior to Admission medications   Not on File    Family History Family History  Problem Relation Age of Onset  . Hypertension Mother   . Hypertension Sister   . Breast cancer Maternal Aunt   . Hypertension Maternal  Grandmother   . Cervical cancer Maternal Grandmother     Social History Social History   Tobacco Use  . Smoking status: Former Smoker    Packs/day: 1.00    Types: Cigars    Quit date: 12/02/2016    Years since quitting: 2.5  . Smokeless tobacco: Never Used  Substance Use Topics  . Alcohol use: Yes    Comment: occasional  . Drug use: Yes    Frequency: 7.0 times per week    Types: Marijuana    Comment: uses daily     Allergies   Patient has no known allergies.   Review of Systems Review of Systems  Constitutional: Negative for fatigue and fever.  Respiratory: Negative for cough and shortness of breath.   Cardiovascular: Negative for chest pain and palpitations.  Gastrointestinal: Negative for constipation and diarrhea.  Genitourinary: Positive for vaginal discharge. Negative for dysuria, flank pain, frequency, hematuria, pelvic pain, urgency, vaginal bleeding and vaginal pain.     Physical Exam Triage Vital Signs ED Triage Vitals  Enc Vitals Group     BP      Pulse      Resp      Temp      Temp src  SpO2      Weight      Height      Head Circumference      Peak Flow      Pain Score      Pain Loc      Pain Edu?      Excl. in [place]?    No data found.  Updated Vital Signs BP (!) 156/96 (BP Location: Left Arm)   Pulse 75   Temp 98 F (36.7 C) (Temporal)   Resp 16   LMP 05/27/2019   SpO2 98%   Visual Acuity Right Eye Distance:   Left Eye Distance:   Bilateral Distance:    Right Eye Near:   Left Eye Near:    Bilateral Near:     Physical Exam Constitutional:      General: She is not in acute distress.    Appearance: She is normal weight. She is not ill-appearing.  HENT:     Head: Normocephalic and atraumatic.  Eyes:     General: No scleral icterus.    Pupils: Pupils are equal, round, and reactive to light.  Cardiovascular:     Rate and Rhythm: Normal rate.  Pulmonary:     Effort: Pulmonary effort is normal.  Abdominal:     General:  Bowel sounds are normal.     Palpations: Abdomen is soft.     Tenderness: There is no abdominal tenderness. There is no right CVA tenderness, left CVA tenderness or guarding.  Genitourinary:    Comments: Patient declined, self-swab performed Skin:    Coloration: Skin is not jaundiced or pale.  Neurological:     Mental Status: She is alert and oriented to person, place, and time.      UC Treatments / Results  Labs (all labs ordered are listed, but only abnormal results are displayed) Labs Reviewed  CERVICOVAGINAL ANCILLARY ONLY    EKG   Radiology No results found.  Procedures Procedures (including critical care time)  Medications Ordered in UC Medications - No data to display  Initial Impression / Assessment and Plan / UC Course  I have reviewed the triage vital signs and the nursing notes.  Pertinent labs & imaging results that were available during my care of the patient were reviewed by me and considered in my medical decision making (see chart for details).    Cervical vaginal swab pending: Will treat if indicated. Return precautions discussed, patient verbalized understanding and is agreeable to plan. Final Clinical Impressions(s) / UC Diagnoses   Final diagnoses:  Vaginal discharge  Unprotected sex     Discharge Instructions     Testing for chlamydia, gonorrhea, trichomonas is pending: please look for these results on the MyChart app/website.  We will notify you if you are positive and outline treatment at that time.  Important to avoid all forms of sexual intercourse (oral, vaginal, anal) with any/all partners for the next 7 days to avoid spreading/reinfecting. Any/all sexual partners should be notified of testing/treatment today.  Return for persistent/worsening symptoms or if you develop fever, abdominal or pelvic pain, blood in your urine, or are re-exposed to an STI.    ED Prescriptions    None     PDMP not reviewed this encounter.     Hall-Potvin, [place], [place] 06/17/19 3094

## 2019-06-20 ENCOUNTER — Telehealth: Payer: Self-pay | Admitting: Emergency Medicine

## 2019-06-20 LAB — CERVICOVAGINAL ANCILLARY ONLY
Bacterial vaginitis: POSITIVE — AB
Chlamydia: NEGATIVE
Neisseria Gonorrhea: NEGATIVE
Trichomonas: NEGATIVE

## 2019-06-20 MED ORDER — METRONIDAZOLE 500 MG PO TABS: 500.0000 mg | ORAL_TABLET | Freq: Two times a day (BID) | ORAL | 0 refills | Status: AC

## 2019-06-20 NOTE — Telephone Encounter (Signed)
Bacterial vaginosis is positive. This was not treated at the urgent care visit.  Flagyl 500 mg BID x 7 days #14 no refills sent to patients pharmacy of choice.    Patient contacted by phone and made aware of    results. Pt verbalized understanding and had all questions answered.    

## 2019-09-22 ENCOUNTER — Encounter (HOSPITAL_COMMUNITY): Payer: Self-pay

## 2019-09-22 ENCOUNTER — Ambulatory Visit (HOSPITAL_COMMUNITY)
Admission: EM | Admit: 2019-09-22 | Discharge: 2019-09-22 | Disposition: A | Discharge status: Home / Self Care | Payer: PRIVATE HEALTH INSURANCE | Attending: Emergency Medicine | Admitting: Emergency Medicine

## 2019-09-22 ENCOUNTER — Other Ambulatory Visit: Payer: Self-pay

## 2019-09-22 DIAGNOSIS — R1030 Lower abdominal pain, unspecified: Secondary | ICD-10-CM | POA: Insufficient documentation

## 2019-09-22 DIAGNOSIS — Z3202 Encounter for pregnancy test, result negative: Secondary | ICD-10-CM | POA: Diagnosis not present

## 2019-09-22 HISTORY — DX: Endometriosis, unspecified: N80.9

## 2019-09-22 LAB — POCT URINALYSIS DIP (DEVICE)
Bilirubin Urine: NEGATIVE
Glucose, UA: NEGATIVE mg/dL
Ketones, ur: NEGATIVE mg/dL
Leukocytes,Ua: NEGATIVE
Nitrite: NEGATIVE
Protein, ur: NEGATIVE mg/dL
Specific Gravity, Urine: >=1.03 (ref 1.005–1.030)
Urobilinogen, UA: 0.2 mg/dL (ref 0.0–1.0)
pH: 5.5 (ref 5.0–8.0)

## 2019-09-22 LAB — POC URINE PREG, ED: Preg Test, Ur: NEGATIVE

## 2019-09-22 LAB — HCG, QUANTITATIVE, PREGNANCY: hCG, Beta Chain, Quant, S: 1 m[IU]/mL (ref <5)

## 2019-09-22 LAB — POCT PREGNANCY, URINE: Preg Test, Ur: NEGATIVE

## 2019-09-22 MED ORDER — NAPROXEN 500 MG PO TABS: 500.0000 mg | ORAL_TABLET | Freq: Two times a day (BID) | ORAL | 0 refills | Status: DC

## 2019-09-22 NOTE — Discharge Instructions (Signed)
Urine pregnancy negative, blood hcg pending, Monitor MyChart Urine without signs of infection Please try using naprosyn twice daily as needed for discomfort Follow up with OBGYN/Primary care if having persistent pain for ultrasound of pelvic organs If noticing more correlation with bowels please use recommendations below:   Please use Miralax for moderate to severe constipation. Take this once a day for the next 2-3 days. Please also start docusate stool softener, twice a day for at least 1 week. If stools become loose, cut down to once a day for another week. If stools remain loose, cut back to 1 pill every other day for a third week. You can stop docusate thereafter and resume as needed for constipation.  To help reduce constipation and promote bowel health: 1. Drink at least 64 ounces of water each day 2. Eat plenty of fiber (fruits, vegetables, whole grains, legumes) 3. Be physically active or exercise including walking, jogging, swimming, yoga, etc. 4. For active constipation use a stool softener (docusate) or an osmotic laxative (like Miralax) each day, or as needed.

## 2019-09-22 NOTE — ED Provider Notes (Signed)
[facility]    CSN: [id number] Arrival date & time: 09/22/19  1700      History   Chief Complaint Chief Complaint  Patient presents with  . Abdominal Pain    HPI Angelica Blankenship is a 29 y.o. female history of endometriosis presenting today for evaluation of abdominal pain.  Patient notes that over the past 2 weeks she has had persistent lower abdominal pain.  Notes that it is dull in nature and often will feel tight at times.  She had one isolated episode of nausea and vomiting at initial onset, but this is not been persistent.  She has been eating and drinking normally without worsening of pain, occasionally will feel more tight and bloated after eating.  Has had normal bowel movements, denies diarrhea.  Passing bowels daily, denies straining or decrease in quantity.  She denies any urinary symptoms of dysuria, increased frequency or urgency.  Denies any abnormal vaginal discharge or abnormal vaginal bleeding.  Denies specific concerns for STDs, but is sexually active, declines STD testing.  Last menstrual cycle was 3/04.  Is not on any form of birth control.  She does express concern for possible pregnancy and would like to also check blood test that she reports prior pregnancy/miscarriage that was not detected on urine, but was positive with blood testing.  Has had prior laparoscopic surgery for endometriosis, but does still have gallbladder and appendix.  HPI  Past Medical History:  Diagnosis Date  . Endometriosis   . Headache   . Heart murmur    asymptomatic    Patient Active Problem List   Diagnosis Date Noted  . Complete miscarriage 09/12/2014    Past Surgical History:  Procedure Laterality Date  . ABDOMINAL SURGERY    . LAPAROSCOPY N/A 06/18/2015   Procedure: LAPAROSCOPY DIAGNOSTIC;  Surgeon: Maxie Better, MD;  Location: [facility];  Service: Gynecology;  Laterality: N/A;  90 min. requested  . NO PAST SURGERIES    . ROBOTIC ASSISTED DIAGNOSTIC LAPAROSCOPY  N/A 06/18/2015   Procedure:  ROBOTIC ASSISTED DIAGNOSTIC LAPAROSCOPY WITH EXCISION OF PELVIC ENDOMETRIOSIS and lysis of adhesions, chromopertubation;  Surgeon: Maxie Better, MD;  Location: [facility];  Service: Gynecology;  Laterality: N/A;    OB History    Gravida  2   Para      Term      Preterm      AB  2   Living        SAB  1   TAB  1   Ectopic      Multiple  0   Live Births               Home Medications    Prior to Admission medications   Medication Sig Start Date End Date Taking? Authorizing Provider  naproxen (NAPROSYN) 500 MG tablet Take 1 tablet (500 mg total) by mouth 2 (two) times daily. 09/22/19   Zinia Innocent, Junius Creamer, PA-C    Family History Family History  Problem Relation Age of Onset  . Hypertension Mother   . Hypertension Sister   . Breast cancer Maternal Aunt   . Hypertension Maternal Grandmother   . Cervical cancer Maternal Grandmother     Social History Social History   Tobacco Use  . Smoking status: Former Smoker    Packs/day: 1.00    Types: Cigars    Quit date: 12/02/2016    Years since quitting: 2.8  . Smokeless tobacco: Never Used  Substance Use Topics  .  Alcohol use: Yes    Comment: occasional  . Drug use: Yes    Frequency: 7.0 times per week    Types: Marijuana    Comment: uses daily     Allergies   Patient has no known allergies.   Review of Systems Review of Systems  Constitutional: Negative for fever.  Respiratory: Negative for shortness of breath.   Cardiovascular: Negative for chest pain.  Gastrointestinal: Positive for abdominal pain. Negative for constipation, diarrhea, nausea and vomiting.  Genitourinary: Negative for dysuria, flank pain, genital sores, hematuria, menstrual problem, vaginal bleeding, vaginal discharge and vaginal pain.  Musculoskeletal: Negative for back pain.  Skin: Negative for rash.  Neurological: Negative for dizziness, light-headedness and headaches.     Physical Exam Triage  Vital Signs ED Triage Vitals  Enc Vitals Group     BP 09/22/19 1711 (!) 156/97     Pulse Rate 09/22/19 1711 91     Resp 09/22/19 1711 18     Temp 09/22/19 1711 (!) 97.3 F (36.3 C)     Temp Source 09/22/19 1711 Oral     SpO2 09/22/19 1711 98 %     Weight --      Height --      Head Circumference --      Peak Flow --      Pain Score 09/22/19 1708 5     Pain Loc --      Pain Edu? --      Excl. in [place]? --    No data found.  Updated Vital Signs BP (!) 156/97 (BP Location: Right Arm)   Pulse 91   Temp (!) 97.3 F (36.3 C) (Oral)   Resp 18   LMP 09/05/2019   SpO2 98%   Visual Acuity Right Eye Distance:   Left Eye Distance:   Bilateral Distance:    Right Eye Near:   Left Eye Near:    Bilateral Near:     Physical Exam Vitals and nursing note reviewed.  Constitutional:      General: She is not in acute distress.    Appearance: She is well-developed.  HENT:     Head: Normocephalic and atraumatic.  Eyes:     Conjunctiva/sclera: Conjunctivae normal.  Cardiovascular:     Rate and Rhythm: Normal rate and regular rhythm.     Heart sounds: No murmur.  Pulmonary:     Effort: Pulmonary effort is normal. No respiratory distress.     Breath sounds: Normal breath sounds.     Comments: Breathing comfortably at rest, CTABL, no wheezing, rales or other adventitious sounds auscultated Abdominal:     Palpations: Abdomen is soft.     Tenderness: There is no abdominal tenderness.     Comments: Soft, nondistended, no worsening pain with palpation throughout entire abdomen, patient reports pain throughout bilateral lower quadrants, but is not triggered by deep palpation.  Periumbilical area with induration noted without erythema-patient reports this is normal since laparoscopic surgery  Musculoskeletal:     Cervical back: Neck supple.  Skin:    General: Skin is warm and dry.  Neurological:     Mental Status: She is alert.      UC Treatments / Results  Labs (all labs ordered  are listed, but only abnormal results are displayed) Labs Reviewed  POCT URINALYSIS DIP (DEVICE) - Abnormal; Notable for the following components:      Result Value   Hgb urine dipstick TRACE (*)    All other components within normal  limits  HCG, QUANTITATIVE, PREGNANCY  POCT PREGNANCY, URINE  POC URINE PREG, ED    EKG   Radiology No results found.  Procedures Procedures (including critical care time)  Medications Ordered in UC Medications - No data to display  Initial Impression / Assessment and Plan / UC Course  I have reviewed the triage vital signs and the nursing notes.  Pertinent labs & imaging results that were available during my care of the patient were reviewed by me and considered in my medical decision making (see chart for details).     Urine pregnancy negative, will check hCG for confirmation.  UA with no signs of infection.  Discussed possible etiology of constipation although has not had any recent changes, discussed general lifestyle recommendations for moving bowels more regularly as well as over-the-counter medicines since she may try if noticing more correlation with moving bowels.  Follow-up with OB/GYN/PCP outpatient for possible ultrasound of pelvic area to evaluate for any fibroids or ovarian cysts.  Will provide Naprosyn to use twice daily as needed for pain in the meantime.  If developing worsening pain to follow-up in emergency room.  Discussed strict return precautions. Patient verbalized understanding and is agreeable with plan.  Final Clinical Impressions(s) / UC Diagnoses   Final diagnoses:  Lower abdominal pain     Discharge Instructions     Urine pregnancy negative, blood hcg pending, Monitor MyChart Urine without signs of infection Please try using naprosyn twice daily as needed for discomfort Follow up with OBGYN/Primary care if having persistent pain for ultrasound of pelvic organs If noticing more correlation with bowels please use  recommendations below:   Please use Miralax for moderate to severe constipation. Take this once a day for the next 2-3 days. Please also start docusate stool softener, twice a day for at least 1 week. If stools become loose, cut down to once a day for another week. If stools remain loose, cut back to 1 pill every other day for a third week. You can stop docusate thereafter and resume as needed for constipation.  To help reduce constipation and promote bowel health: 1. Drink at least 64 ounces of water each day 2. Eat plenty of fiber (fruits, vegetables, whole grains, legumes) 3. Be physically active or exercise including walking, jogging, swimming, yoga, etc. 4. For active constipation use a stool softener (docusate) or an osmotic laxative (like Miralax) each day, or as needed.   ED Prescriptions    Medication Sig Dispense Auth. Provider   naproxen (NAPROSYN) 500 MG tablet Take 1 tablet (500 mg total) by mouth 2 (two) times daily. 30 tablet Kaidan Harpster, [place] C, PA-C     PDMP not reviewed this encounter.   Lew Dawes, [place] 09/22/19 1818

## 2019-09-22 NOTE — ED Triage Notes (Signed)
Pt c/o lower abdom pain for approx 2 weeks. Denies dysuria sx, vaginal d/c or abnormal vaginal bleeding. Reports vomiting x1 at initial onset of abdom pain.  Denies fever, chills, diarrhea.

## 2019-11-21 ENCOUNTER — Other Ambulatory Visit: Payer: Self-pay | Admitting: Obstetrics

## 2019-12-17 ENCOUNTER — Other Ambulatory Visit: Payer: Self-pay | Admitting: Gastroenterology

## 2019-12-17 DIAGNOSIS — R109 Unspecified abdominal pain: Secondary | ICD-10-CM

## 2019-12-23 ENCOUNTER — Ambulatory Visit
Admission: RE | Admit: 2019-12-23 | Discharge: 2019-12-23 | Disposition: A | Discharge status: Home / Self Care | Payer: BC Managed Care – PPO | Source: Ambulatory Visit | Attending: Gastroenterology | Admitting: Gastroenterology

## 2019-12-23 DIAGNOSIS — R109 Unspecified abdominal pain: Secondary | ICD-10-CM

## 2019-12-23 IMAGING — US US ABDOMEN COMPLETE
1 series · 14 of 25 positions shown · non-contrast
Comparison: None

CLINICAL DATA: Abdominal pain, bloating and fullness since [DATE], question ascites

EXAM:
ABDOMEN ULTRASOUND COMPLETE

[Series 1: us abdomen complete · 0.23mm/px · 14 of 96 slices shown]
[im 1/96]
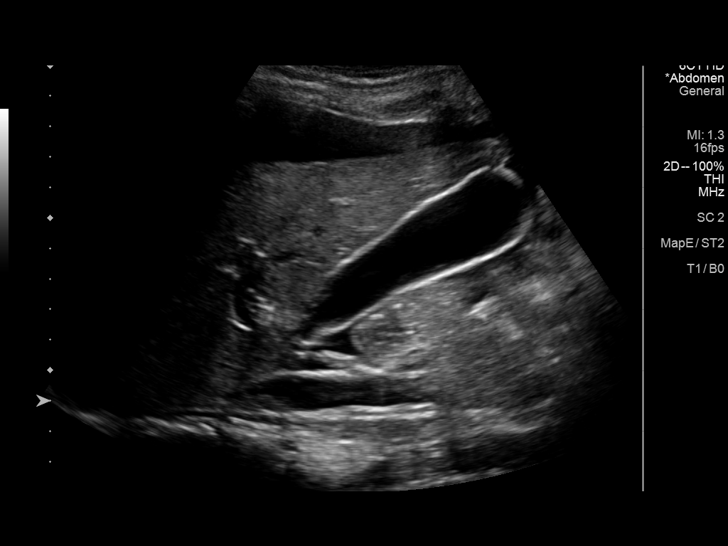
[im 8/96]
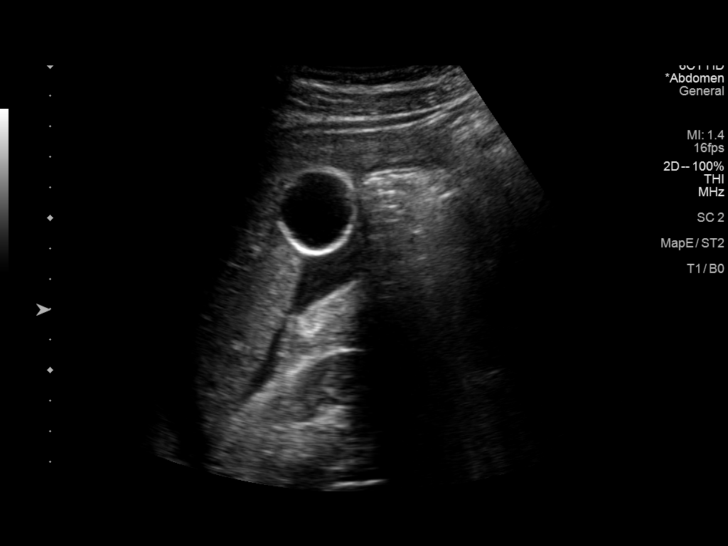
[im 16/96]
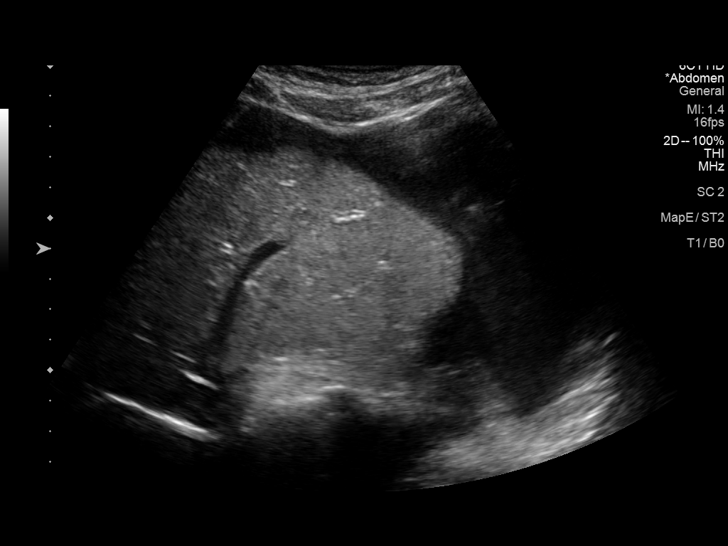
[im 24/96]
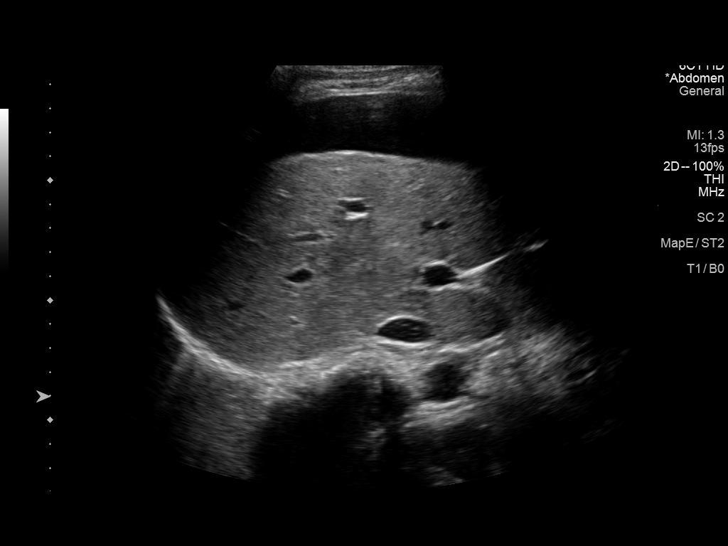
[im 32/96]
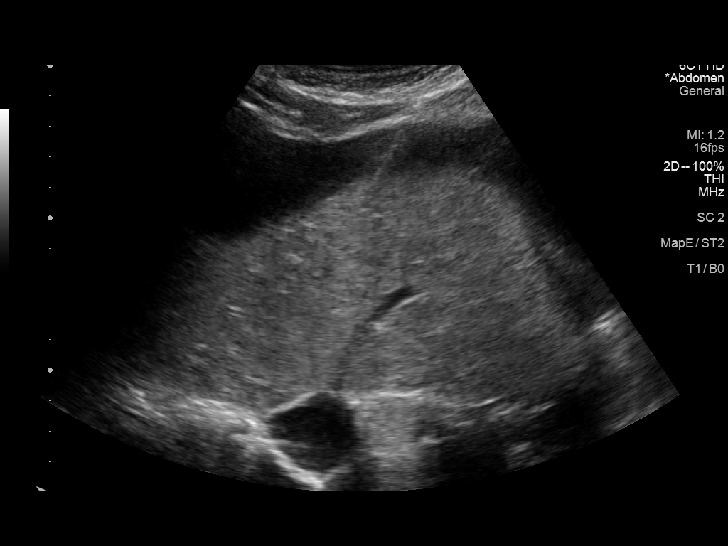
[im 36/96]
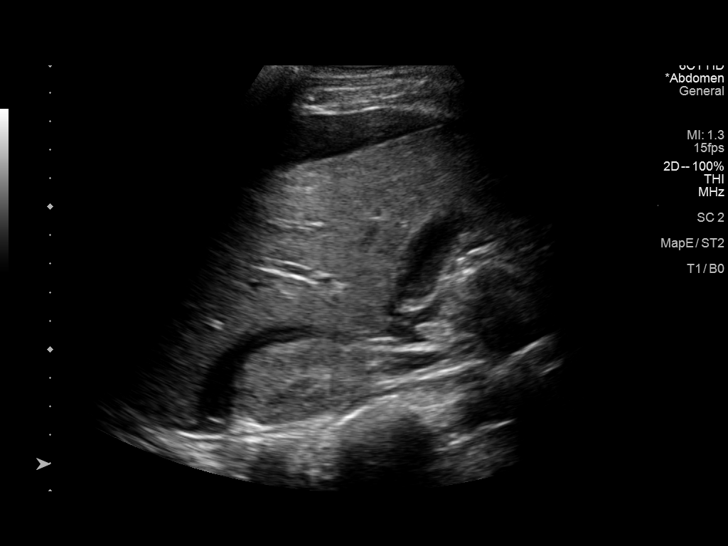
[im 44/96]
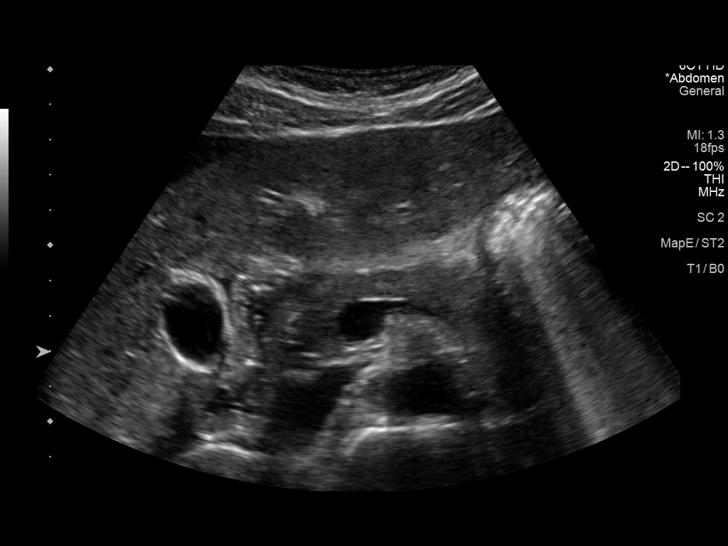
[im 52/96]
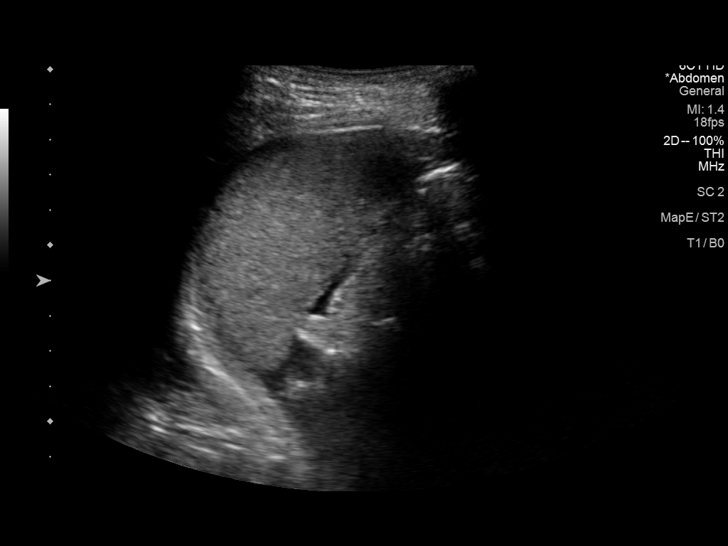
[im 60/96]
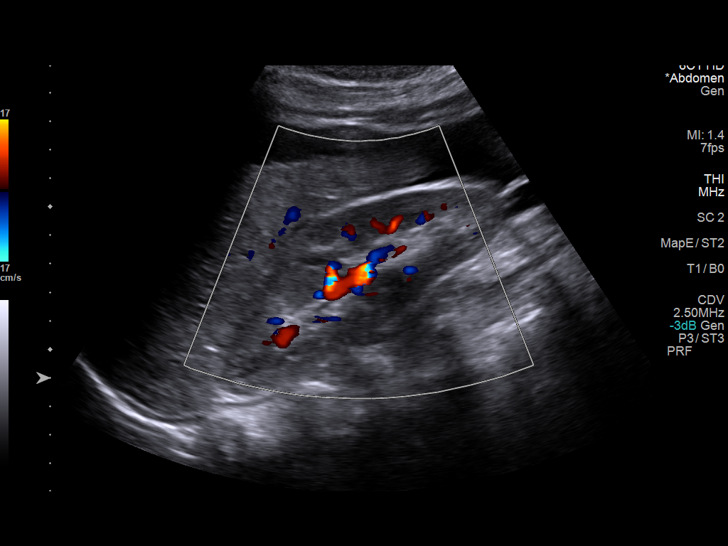
[im 64/96]
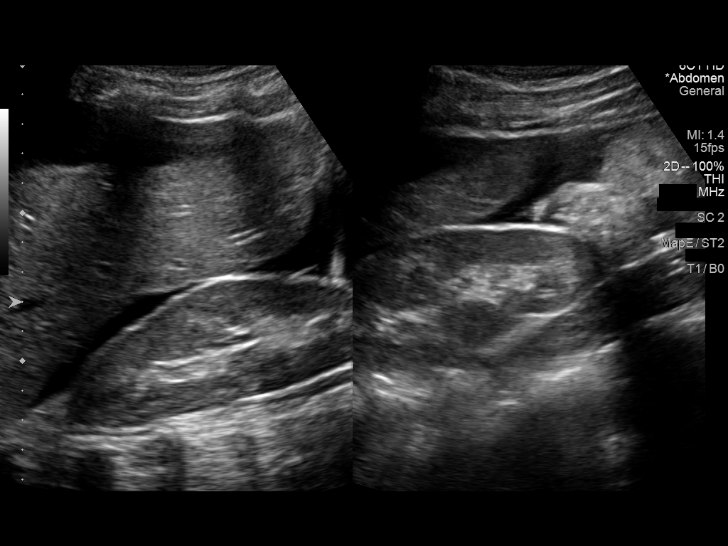
[im 72/96]
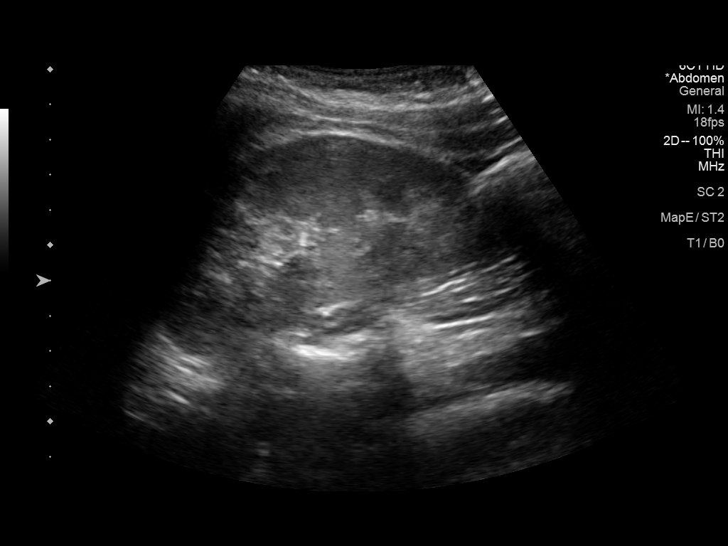
[im 80/96]
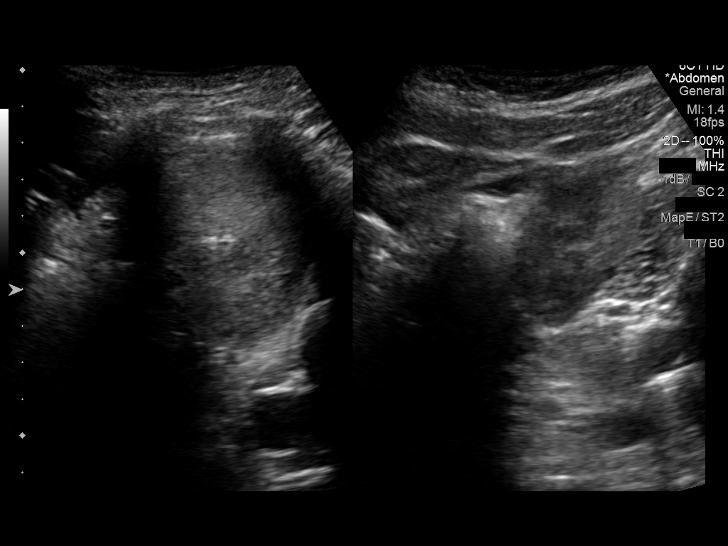
[im 88/96]
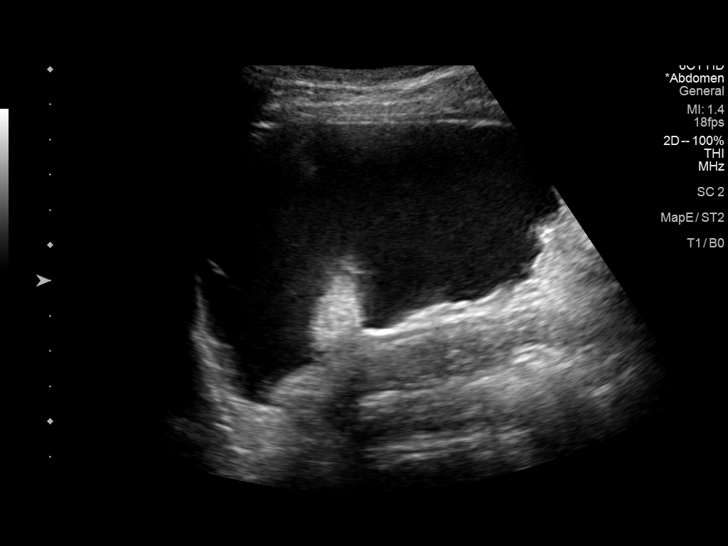
[im 96/96]
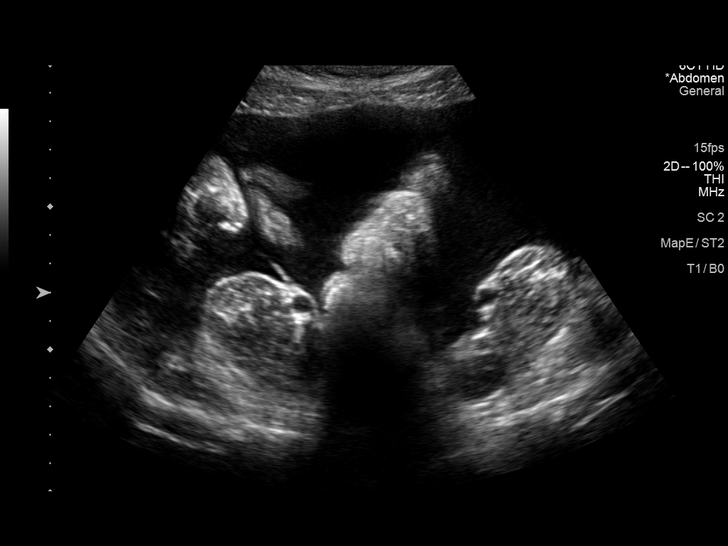

[14 of 25 positions shown; findings below may reference images not displayed]

FINDINGS: Gallbladder: Normally distended without stones or wall thickening.
No pericholecystic fluid or sonographic Murphy sign.

Common bile duct: Diameter: 2 mm, normal

Liver: Upper normal echogenicity without focal mass or contour
nodularity. No intrahepatic biliary dilatation. Portal vein is
patent on color Doppler imaging with normal direction of blood flow
towards the liver.

IVC: Normal appearance

Pancreas: Normal appearance

Spleen: Normal appearance, 4.9 cm length

Right Kidney: Length: 11.3 cm. Normal morphology without mass or
hydronephrosis.

Left Kidney: Length: 11.0 cm. Normal morphology without mass or
hydronephrosis.

Abdominal aorta: Normal caliber

Other findings: Moderate ascites.
IMPRESSION: Moderate ascites.

Otherwise negative exam.

## 2019-12-27 ENCOUNTER — Other Ambulatory Visit (HOSPITAL_COMMUNITY): Payer: Self-pay | Admitting: Gastroenterology

## 2019-12-27 ENCOUNTER — Other Ambulatory Visit: Payer: Self-pay | Admitting: Gastroenterology

## 2019-12-27 DIAGNOSIS — R188 Other ascites: Secondary | ICD-10-CM

## 2020-01-01 ENCOUNTER — Other Ambulatory Visit: Payer: Self-pay

## 2020-01-01 ENCOUNTER — Ambulatory Visit (HOSPITAL_COMMUNITY)
Admission: RE | Admit: 2020-01-01 | Discharge: 2020-01-01 | Disposition: A | Discharge status: Home / Self Care | Payer: BC Managed Care – PPO | Source: Ambulatory Visit | Attending: Gastroenterology | Admitting: Gastroenterology

## 2020-01-01 DIAGNOSIS — R188 Other ascites: Secondary | ICD-10-CM | POA: Insufficient documentation

## 2020-01-01 HISTORY — PX: IR PARACENTESIS: IMG2679

## 2020-01-01 LAB — BODY FLUID CELL COUNT WITH DIFFERENTIAL
Eos, Fluid: 0 %
Lymphs, Fluid: 53 %
Monocyte-Macrophage-Serous Fluid: 45 % — ABNORMAL LOW (ref 50–90)
Neutrophil Count, Fluid: 2 % (ref 0–25)
Total Nucleated Cell Count, Fluid: 430 cu mm (ref 0–1000)

## 2020-01-01 LAB — ALBUMIN, PLEURAL OR PERITONEAL FLUID: Albumin, Fluid: 2.8 g/dL

## 2020-01-01 LAB — PROTEIN, PLEURAL OR PERITONEAL FLUID: Total protein, fluid: 4.5 g/dL

## 2020-01-01 IMAGING — US IR PARACENTESIS
1 series · 4 of 4 positions shown · non-contrast
Comparison: none

INDICATION: Patient with a history of endometriosis presents today to the
Interventional [HOSPITAL] with ascites. IR asked to
perform a diagnostic and therapeutic paracentesis.

[Series 1: ir (id) (id)/(id)/(id) ir · 4 of 4 slices shown]
[im 1/4]
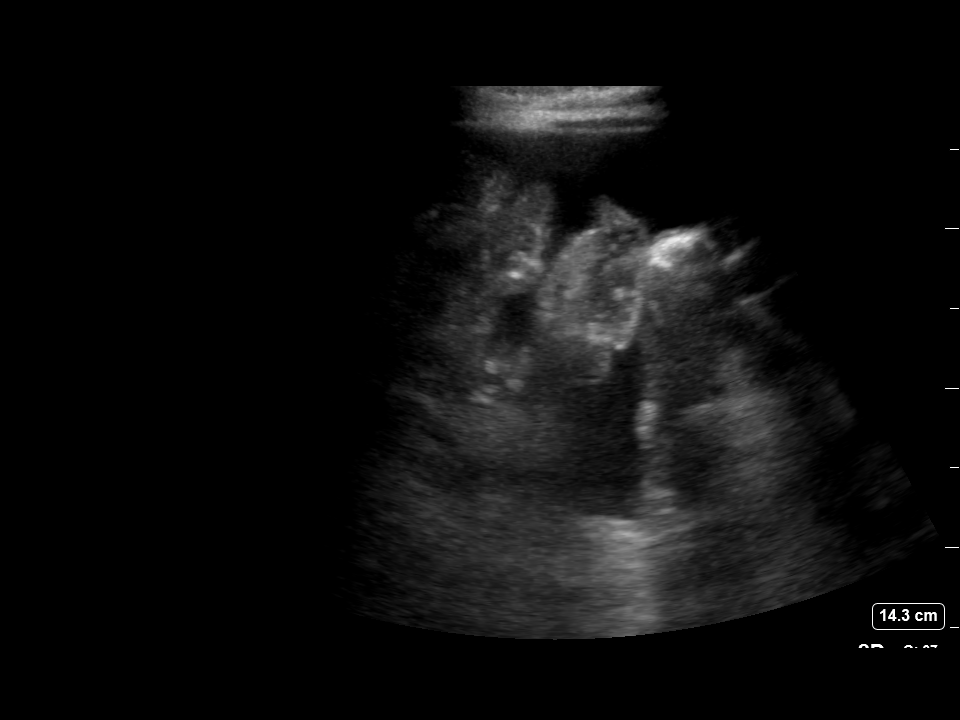
[im 2/4]
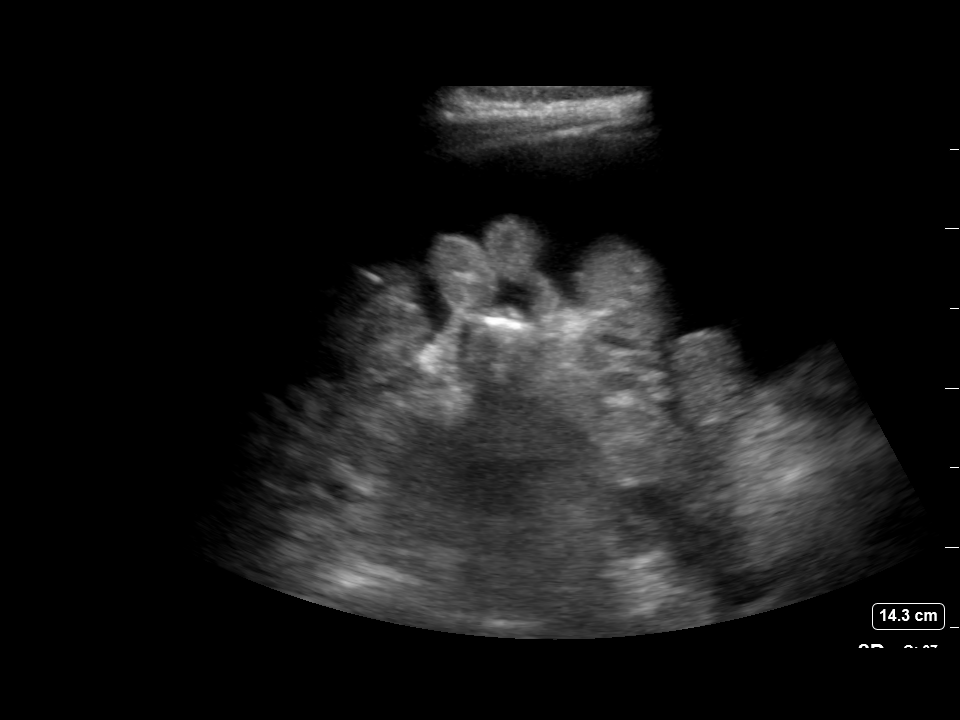
[im 3/4]
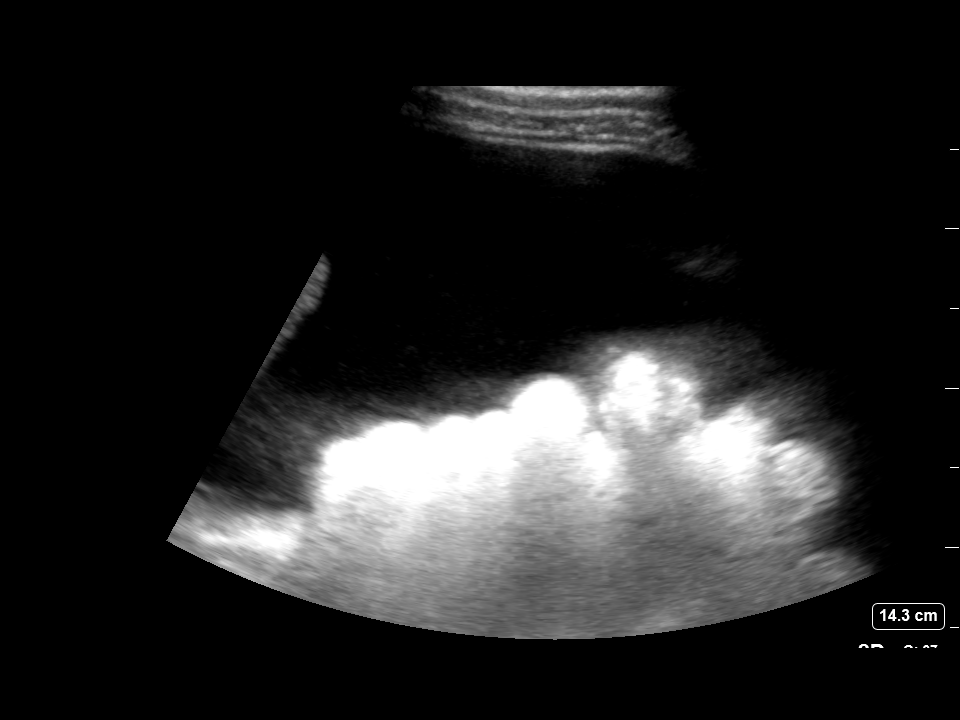
[im 4/4]
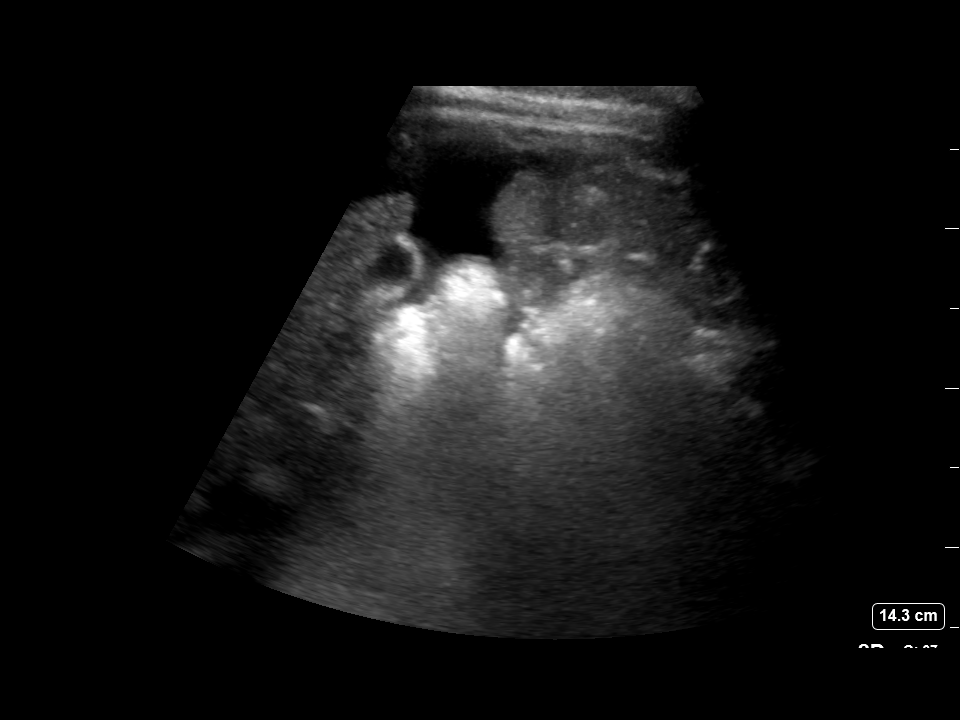

[4 of 4 positions shown; findings below may reference images not displayed]

EXAM:
ULTRASOUND GUIDED PARACENTESIS

MEDICATIONS:
1% lidocaine 10 mL

COMPLICATIONS:
None immediate

PROCEDURE:
Informed written consent was obtained from the patient after a
discussion of the risks, benefits and alternatives to treatment. A
timeout was performed prior to the initiation of the procedure.

Initial ultrasound scanning demonstrates a large amount of ascites
within the right lower abdominal quadrant. The right lower abdomen
was prepped and draped in the usual sterile fashion. 1% lidocaine
was used for local anesthesia.

Following this, a 19 gauge, 7-cm, Yueh catheter was introduced. An
ultrasound image was saved for documentation purposes. The
paracentesis was performed. The catheter was removed and a dressing
was applied. The patient tolerated the procedure well without
immediate post procedural complication.
FINDINGS: A total of approximately 3 L of dark red fluid was removed. Samples
were sent to the laboratory as requested by the clinical team.
IMPRESSION: Successful ultrasound-guided paracentesis yielding 3 liters of
peritoneal fluid. Read by: SRINIVASAN, NP

## 2020-01-01 MED ORDER — LIDOCAINE HCL (PF) 1 % IJ SOLN
INTRAMUSCULAR | Status: DC | PRN
  Administered 2020-01-01: 10 mL

## 2020-01-01 MED ORDER — LIDOCAINE HCL 1 % IJ SOLN
INTRAMUSCULAR | Status: AC
  Filled 2020-01-01: qty 20

## 2020-01-01 NOTE — Procedures (Signed)
PROCEDURE SUMMARY:  Successful US guided paracentesis from right lateral abdomen.  Yielded 3L of dark red fluid.  No immediate complications.  Pt tolerated well.   Specimen sent for labs.  EBL < 32mL  Mickie Kay, NP 01/01/2020 3:35 PM

## 2020-01-08 LAB — CYTOLOGY - NON PAP

## 2020-01-29 ENCOUNTER — Other Ambulatory Visit: Payer: Self-pay | Admitting: Gastroenterology

## 2020-01-30 ENCOUNTER — Other Ambulatory Visit: Payer: Self-pay | Admitting: Gastroenterology

## 2020-01-30 DIAGNOSIS — R188 Other ascites: Secondary | ICD-10-CM

## 2020-01-31 ENCOUNTER — Other Ambulatory Visit: Payer: Self-pay

## 2020-01-31 ENCOUNTER — Encounter: Payer: Self-pay | Admitting: Cardiology

## 2020-01-31 ENCOUNTER — Ambulatory Visit: Payer: BC Managed Care – PPO | Admitting: Cardiology

## 2020-01-31 VITALS — BP 141/83 | HR 71 | Resp 15 | Ht 66.0 in | Wt 140.0 lb

## 2020-01-31 DIAGNOSIS — R030 Elevated blood-pressure reading, without diagnosis of hypertension: Secondary | ICD-10-CM

## 2020-01-31 DIAGNOSIS — Z87891 Personal history of nicotine dependence: Secondary | ICD-10-CM

## 2020-01-31 DIAGNOSIS — Z87898 Personal history of other specified conditions: Secondary | ICD-10-CM

## 2020-01-31 DIAGNOSIS — F1290 Cannabis use, unspecified, uncomplicated: Secondary | ICD-10-CM

## 2020-01-31 DIAGNOSIS — R011 Cardiac murmur, unspecified: Secondary | ICD-10-CM

## 2020-01-31 NOTE — Progress Notes (Signed)
Date:  01/31/2020   ID:  Angelica Blankenship, DOB 05/16/1991, MRN [id number]  PCP:  Ladell Pier, MD  Cardiologist:  Rex Kras, DO, [facility] (established care 01/31/2020)  REASON FOR CONSULT: Ascites  REQUESTING PHYSICIAN:  Carol Ada, MD [address] [place],  [place] [address]  Chief Complaint  Patient presents with  . New Patient (Initial Visit)  . hx of ascites    HPI  Angelica Blankenship is a 29 y.o. female who presents to the office with a chief complaint of " cardiac cause of her history of ascites." She is referred to the office at the request of Carol Ada, MD.  No known cardiac history per patient.  Patient is referred to the office at the request of Dr. Benson Norway for evaluation of cardiac cause of her recent episode of ascites needing paracentesis.  Patient is accompanied by her fianc at today's office visit.  She provides verbal consent in regards to discussing her care in front of her.  Patient that she has been retaining fluid in her belly since February 2021.  Eventually was drained at [facility] in June 2021 and per procedure note approximately 3 L of dark red fluid was drained from the right lateral abdomen.  Since then patient has been seen by GYN as well as gastroenterology for possible cause.  According to patient the work-up has been negative and therefore referred to cardiology for further evaluation.    Patient denies any chest pain or shortness of breath at rest or with effort related activities.  No prior history of congestive heart failure.  Patient denies orthopnea, paroxysmal nocturnal dyspnea or lower extremity swelling.  Clinically patient is euvolemic.  No family history of premature coronary artery disease.   Patient drinks 1 pint of alcohol per month.  She does not smoke cigarettes.  But she does smoke marijuana on a daily basis.  No other recreational drugs.  No known history of cancer per patient.  Her blood pressure is elevated at  today's office visit but no prior diagnosis of hypertension.  I have asked her to establish care with PCP to evaluate for noncardiac causes of her recent episode of ascites requiring paracentesis.  Denies prior history of coronary artery disease, myocardial infarction, congestive heart failure, deep venous thrombosis, pulmonary embolism, stroke, transient ischemic attack.   ALLERGIES: No Known Allergies  MEDICATION LIST PRIOR TO VISIT: Current Meds  Medication Sig  . acetaminophen (TYLENOL) 500 MG tablet Take 500 mg by mouth every 6 (six) hours as needed.  Marland Kitchen aspirin-acetaminophen-caffeine (EXCEDRIN MIGRAINE) 250-250-65 MG tablet Take 1 tablet by mouth every 6 (six) hours as needed for headache.     PAST MEDICAL HISTORY: Past Medical History:  Diagnosis Date  . Ascites   . Endometriosis   . Headache   . Heart murmur    asymptomatic    PAST SURGICAL HISTORY: Past Surgical History:  Procedure Laterality Date  . ABDOMINAL SURGERY    . IR PARACENTESIS  01/01/2020  . LAPAROSCOPY N/A 06/18/2015   Procedure: LAPAROSCOPY DIAGNOSTIC;  Surgeon: Servando Salina, MD;  Location: [facility];  Service: Gynecology;  Laterality: N/A;  90 min. requested  . ROBOTIC ASSISTED DIAGNOSTIC LAPAROSCOPY N/A 06/18/2015   Procedure:  ROBOTIC ASSISTED DIAGNOSTIC LAPAROSCOPY WITH EXCISION OF PELVIC ENDOMETRIOSIS and lysis of adhesions, chromopertubation;  Surgeon: Servando Salina, MD;  Location: [facility];  Service: Gynecology;  Laterality: N/A;    FAMILY HISTORY: The patient family history includes Breast cancer in her maternal aunt;  Cervical cancer in her maternal grandmother; Hypertension in her maternal grandmother, mother, and sister.  SOCIAL HISTORY:  The patient  reports that she quit smoking about 3 years ago. Her smoking use included cigars. She smoked 1.00 pack per day. She has never used smokeless tobacco. She reports current alcohol use. She reports current drug use. Frequency: 35.00 times per  week. Drug: Marijuana.  REVIEW OF SYSTEMS: Review of Systems  Constitutional: Negative for chills and fever.  HENT: Negative for hoarse voice and nosebleeds.   Eyes: Negative for discharge, double vision and pain.  Cardiovascular: Negative for chest pain, claudication, dyspnea on exertion, leg swelling, near-syncope, orthopnea, palpitations, paroxysmal nocturnal dyspnea and syncope.  Respiratory: Negative for hemoptysis and shortness of breath.   Musculoskeletal: Negative for muscle cramps and myalgias.  Gastrointestinal: Negative for abdominal pain, constipation, diarrhea, hematemesis, hematochezia, melena, nausea and vomiting.       History of ascites.   Neurological: Negative for dizziness and light-headedness.    PHYSICAL EXAM: Vitals with BMI 01/31/2020 09/22/2019 06/17/2019  Height 5' 6"  - -  Weight 140 lbs - -  BMI 67.12 - -  Systolic [phone]  Diastolic 83 97 96  Pulse 71 91 75    CONSTITUTIONAL: Well-developed and well-nourished. No acute distress.  SKIN: Skin is warm and dry. No rash noted. No cyanosis. No pallor. No jaundice HEAD: Normocephalic and atraumatic.  EYES: No scleral icterus MOUTH/THROAT: Moist oral membranes.  NECK: No JVD present. No thyromegaly noted. No carotid bruits  LYMPHATIC: No visible cervical adenopathy.  CHEST Normal respiratory effort. No intercostal retractions  LUNGS: Clear to auscultation bilaterally.  No stridor. No wheezes. No rales.  CARDIOVASCULAR: Regular rate and rhythm, positive A2-N0, soft holosystolic murmur heard at the apex, no gallops or rubs. ABDOMINAL: Soft, nontender, nondistended, positive bowel sounds in all 4 quadrants, no rebound or rigidity, no apparent ascites.  EXTREMITIES: No peripheral edema  HEMATOLOGIC: No significant bruising NEUROLOGIC: Oriented to person, place, and time. Nonfocal. Normal muscle tone.  PSYCHIATRIC: Normal mood and affect. Normal behavior. Cooperative  CARDIAC DATABASE: EKG: 01/31/2020:  Normal sinus rhythm, 70 bpm, normal axis, poor R wave progression, without underlying ischemia or injury pattern.  No prior EKGs available for comparison  Echocardiogram: None  Stress Testing: None  Heart Catheterization: None  LABORATORY DATA: CBC Latest Ref Rng & Units 06/17/2015 07/02/2014 02/20/2009  WBC 4.0 - 10.5 K/uL 5.9 6.2 -  Hemoglobin 12.0 - 15.0 g/dL 11.4(L) 10.5(L) 13.6  Hematocrit 36 - 46 % 33.0(L) 30.4(L) 40.0  Platelets 150 - 400 K/uL 218 232 -    CMP Latest Ref Rng & Units 02/20/2009  Glucose 70 - 99 mg/dL 107(H)  BUN 6 - 23 mg/dL 7  Creatinine 0.40 - 1.20 mg/dL 0.6  Sodium 135 - 145 mEq/L 141  Potassium 3.5 - 5.1 mEq/L 4.0  Chloride 96 - 112 mEq/L 106    Lipid Panel  No results found for: CHOL, TRIG, HDL, CHOLHDL, VLDL, LDLCALC, LDLDIRECT, LABVLDL  No components found for: NTPROBNP No results for input(s): PROBNP in the last 8760 hours. No results for input(s): TSH in the last 8760 hours.  BMP No results for input(s): NA, K, CL, CO2, GLUCOSE, BUN, CREATININE, CALCIUM, GFRNONAA, GFRAA in the last 8760 hours.  HEMOGLOBIN A1C No results found for: HGBA1C, MPG   External Labs: Collected: 01/01/2020 Creatinine 0.8 mg/dL. eGFR: 114 mL/min per 1.73 m AST 16, ALT 12, alkaline phosphatase 32   IMPRESSION:    ICD-10-CM   1.  Hx of ascites  Z87.898   2. Cardiac murmur  R01.1 PCV ECHOCARDIOGRAM COMPLETE  3. Blood pressure elevated without history of HTN  R03.0 EKG 12-Lead  4. Former smoker  Z87.891   5. Marijuana use  F12.90      RECOMMENDATIONS: Angelica Blankenship is a 29 y.o. female without known cardiac history presents to the office for evaluation of recent episode of ascites requiring paracentesis.  History of ascites: Etiology currently unknown.  Patient states that she has followed up with GYN and gastroenterology.  She is here to be evaluated from a cardiology standpoint to see if it is cardiac in origin.   Clinically patient is euvolemic.  No  symptoms of congestive heart failure.  No known cardiac history.  Educated on the importance of complete cessation of alcohol consumption and marijuana smoking.  Patient is asked to establish care with primary care to be evaluated for noncardiac causes of her recent ascites.    She is asked to make sure she has age-appropriate cancer screening as well.  Maternal grandmother had a history of cervical cancer.  Echocardiogram will be ordered to evaluate for structural heart disease and left ventricular systolic function.  Cardiac murmur: Echocardiogram will be ordered to evaluate for structural heart disease and left ventricular systolic function.  Elevated blood pressures without diagnosis of hypertension:  Patient is asked to keep a log of her blood pressures and to review it with her PCP when she establishes care.  Want to bring it in at the next office visit.  My clinical suspicion is that she does have a degree of high blood pressure given her last 3 office blood pressure readings.  Low-salt diet recommended.   Former smoker: Educated on the importance of continued smoking cessation.  FINAL MEDICATION LIST END OF ENCOUNTER: No orders of the defined types were placed in this encounter.   Medications Discontinued During This Encounter  Medication Reason  . naproxen (NAPROSYN) 500 MG tablet Patient Preference     Current Outpatient Medications:  .  acetaminophen (TYLENOL) 500 MG tablet, Take 500 mg by mouth every 6 (six) hours as needed., Disp: , Rfl:  .  aspirin-acetaminophen-caffeine (EXCEDRIN MIGRAINE) 250-250-65 MG tablet, Take 1 tablet by mouth every 6 (six) hours as needed for headache., Disp: , Rfl:   Orders Placed This Encounter  Procedures  . EKG 12-Lead  . PCV ECHOCARDIOGRAM COMPLETE   There are no Patient Instructions on file for this visit.   --Continue cardiac medications as reconciled in final medication list. --No follow-ups on file. Or sooner if  needed. --Continue follow-up with your primary care physician regarding the management of your other chronic comorbid conditions.  Patient's questions and concerns were addressed to her satisfaction. She voices understanding of the instructions provided during this encounter.   This note was created using a voice recognition software as a result there may be grammatical errors inadvertently enclosed that do not reflect the nature of this encounter. Every attempt is made to correct such errors.  Rex Kras, [place], [employer]  Pager: [phone] Office: [phone]

## 2020-02-06 ENCOUNTER — Other Ambulatory Visit: Payer: BC Managed Care – PPO

## 2020-02-07 ENCOUNTER — Ambulatory Visit
Admission: RE | Admit: 2020-02-07 | Discharge: 2020-02-07 | Disposition: A | Discharge status: Home / Self Care | Payer: PRIVATE HEALTH INSURANCE | Source: Ambulatory Visit | Attending: Gastroenterology | Admitting: Gastroenterology

## 2020-02-07 ENCOUNTER — Ambulatory Visit: Payer: BC Managed Care – PPO

## 2020-02-07 ENCOUNTER — Other Ambulatory Visit: Payer: Self-pay

## 2020-02-07 DIAGNOSIS — R188 Other ascites: Secondary | ICD-10-CM

## 2020-02-07 DIAGNOSIS — R011 Cardiac murmur, unspecified: Secondary | ICD-10-CM

## 2020-02-07 IMAGING — US US ABDOMEN LIMITED
1 series · 8 of 8 positions shown · non-contrast
Comparison: Complete abdominal ultrasound [DATE]

CLINICAL DATA: Assess for ascites.

EXAM:
LIMITED ABDOMEN ULTRASOUND FOR ASCITES
TECHNIQUE: Limited ultrasound survey for ascites was performed in all four
abdominal quadrants.

[Series 1: us abdomen limited · 0.23mm/px · 8 of 8 slices shown]
[im 1/8]
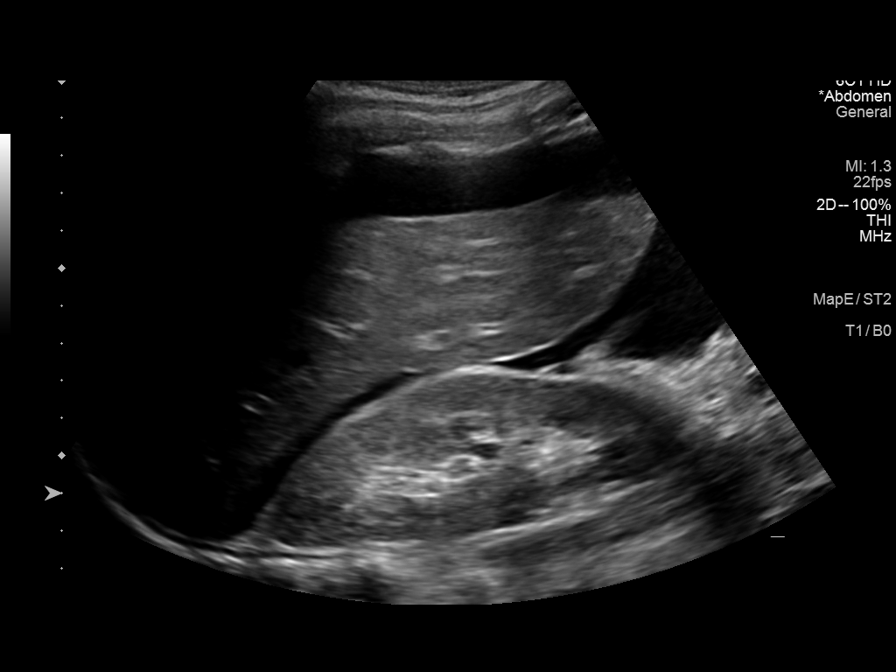
[im 2/8]
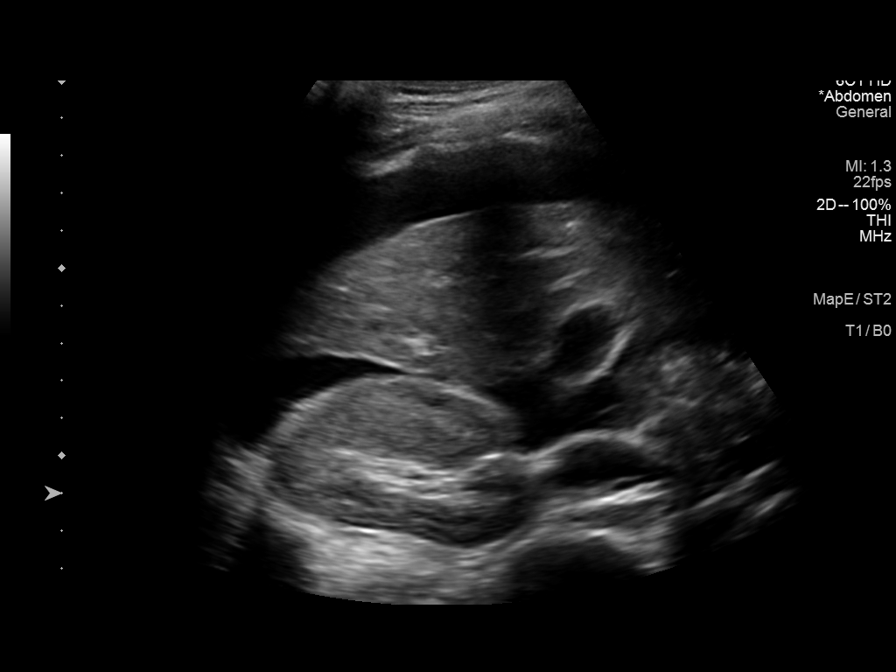
[im 3/8]
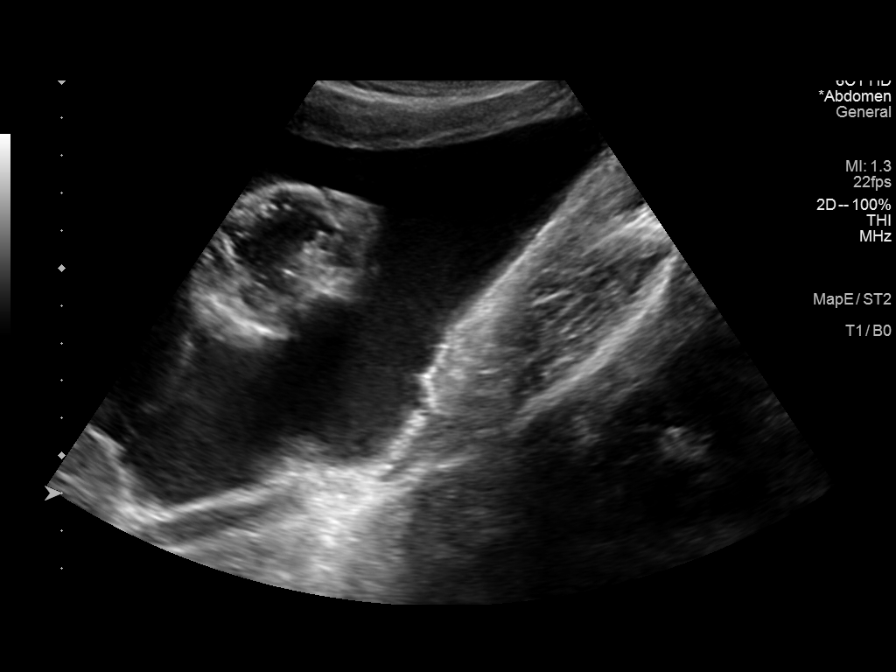
[im 4/8]
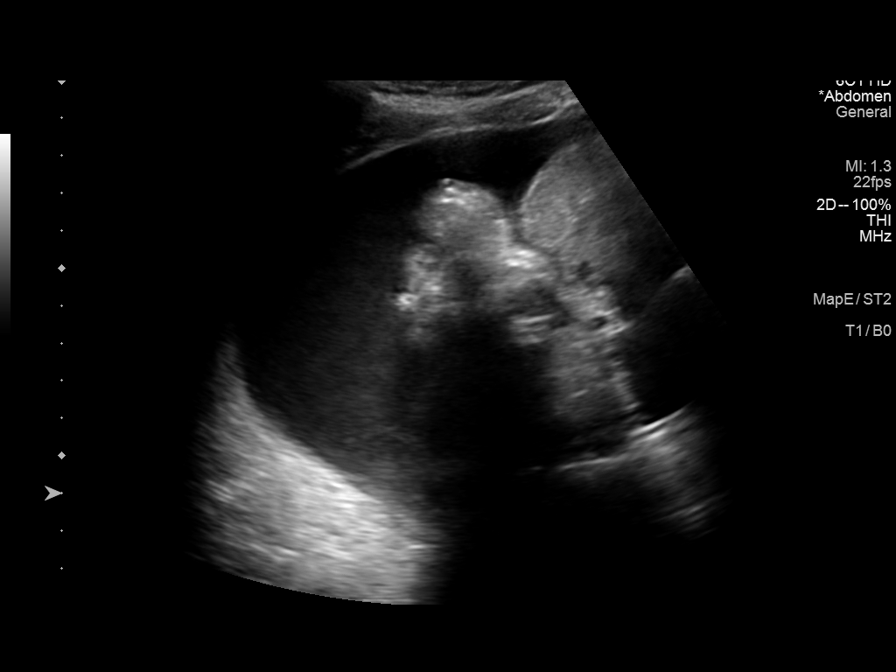
[im 5/8]
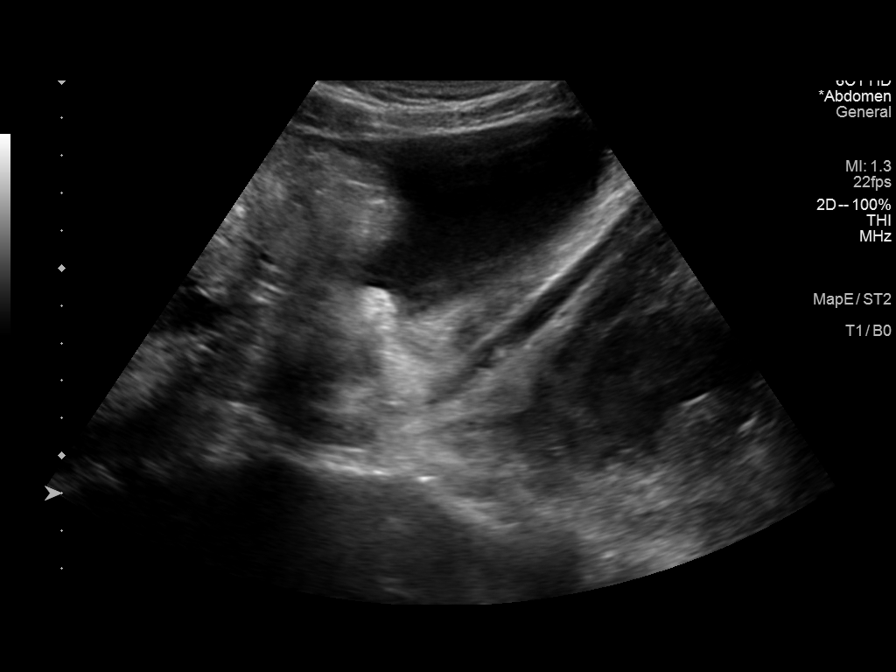
[im 6/8]
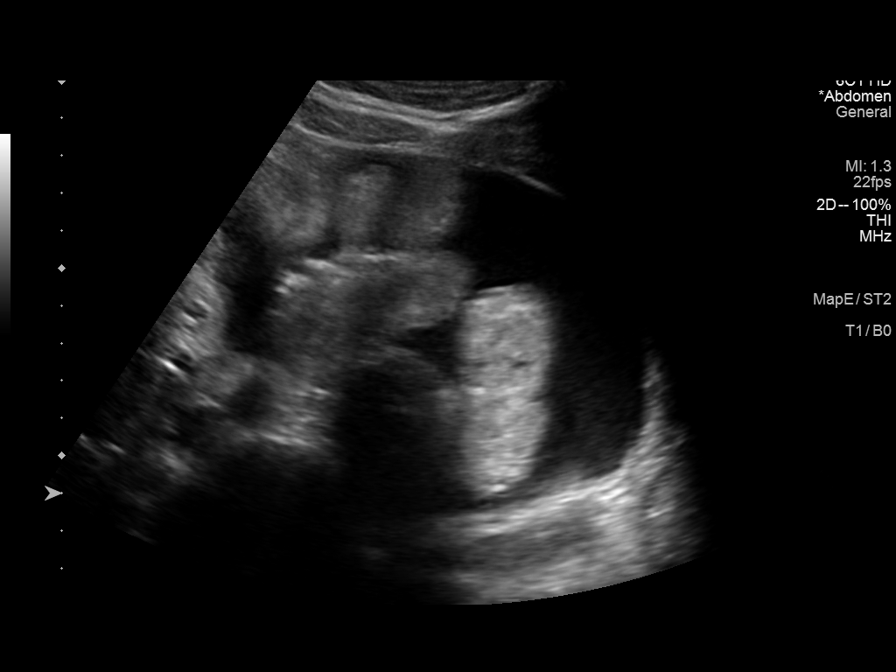
[im 7/8]
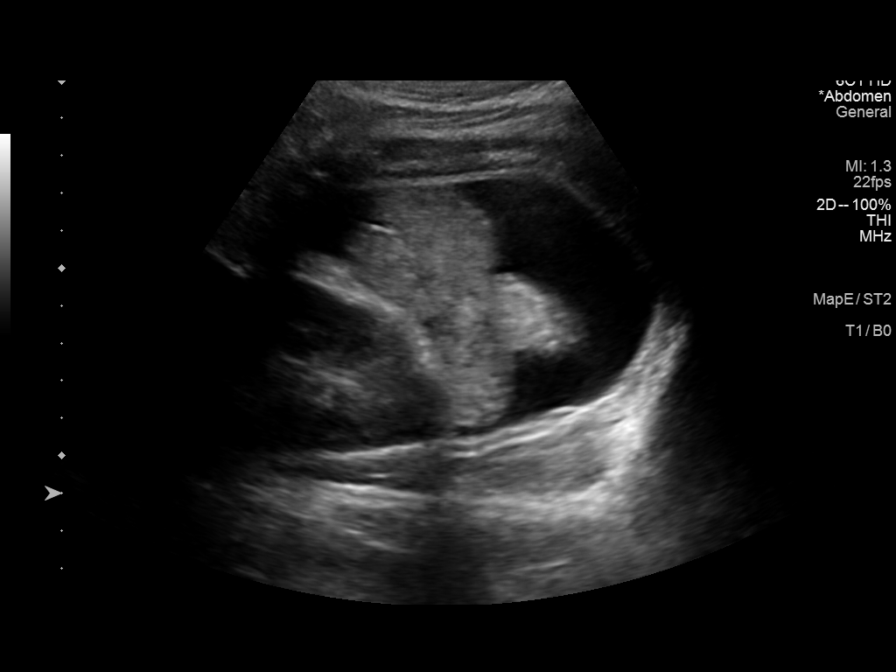
[im 8/8]
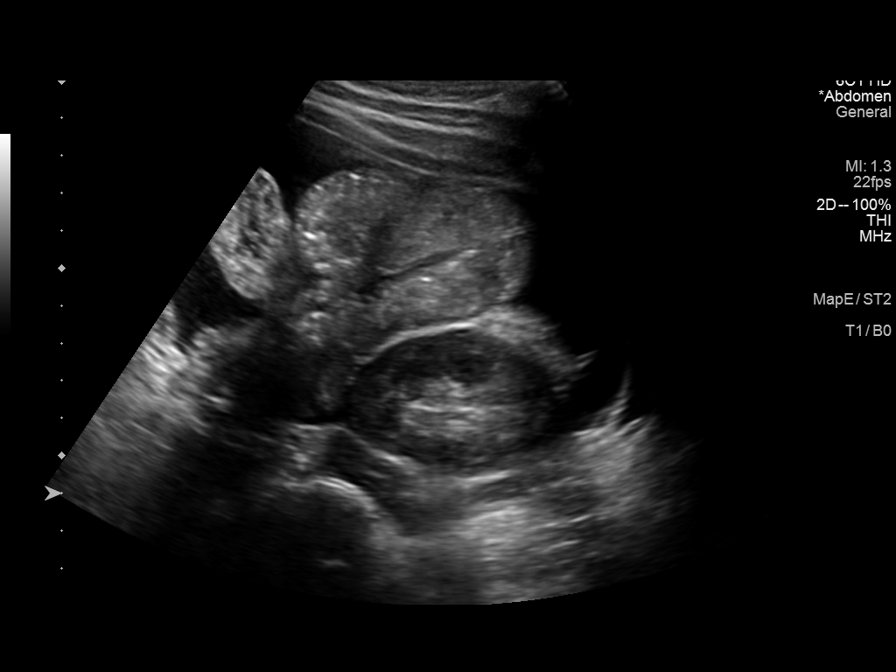

[8 of 8 positions shown; findings below may reference images not displayed]

FINDINGS: Moderate volume of ascites/free fluid noted in all 4 quadrants.
Greatest volume appears to be within the right lower quadrant.
IMPRESSION: Moderate volume abdominal ascites in all 4 quadrants.

## 2020-02-10 ENCOUNTER — Other Ambulatory Visit: Payer: Self-pay | Admitting: Gastroenterology

## 2020-02-10 ENCOUNTER — Other Ambulatory Visit (HOSPITAL_COMMUNITY): Payer: Self-pay | Admitting: Gastroenterology

## 2020-02-10 DIAGNOSIS — R188 Other ascites: Secondary | ICD-10-CM

## 2020-02-14 ENCOUNTER — Ambulatory Visit (HOSPITAL_COMMUNITY)
Admission: RE | Admit: 2020-02-14 | Discharge: 2020-02-14 | Disposition: A | Discharge status: Home / Self Care | Payer: BC Managed Care – PPO | Source: Ambulatory Visit | Attending: Gastroenterology | Admitting: Gastroenterology

## 2020-02-14 ENCOUNTER — Other Ambulatory Visit: Payer: Self-pay

## 2020-02-14 DIAGNOSIS — R188 Other ascites: Secondary | ICD-10-CM | POA: Diagnosis present

## 2020-02-14 IMAGING — US US PARACENTESIS
1 series · 5 of 5 positions shown · non-contrast
Comparison: none

INDICATION: Patient with history of abdominal distension/bloating/recurrent
ascites of uncertain etiology; request received for diagnostic and
therapeutic paracentesis.

[Series 1: us paracentesis · 5 of 5 slices shown]
[im 1/5]
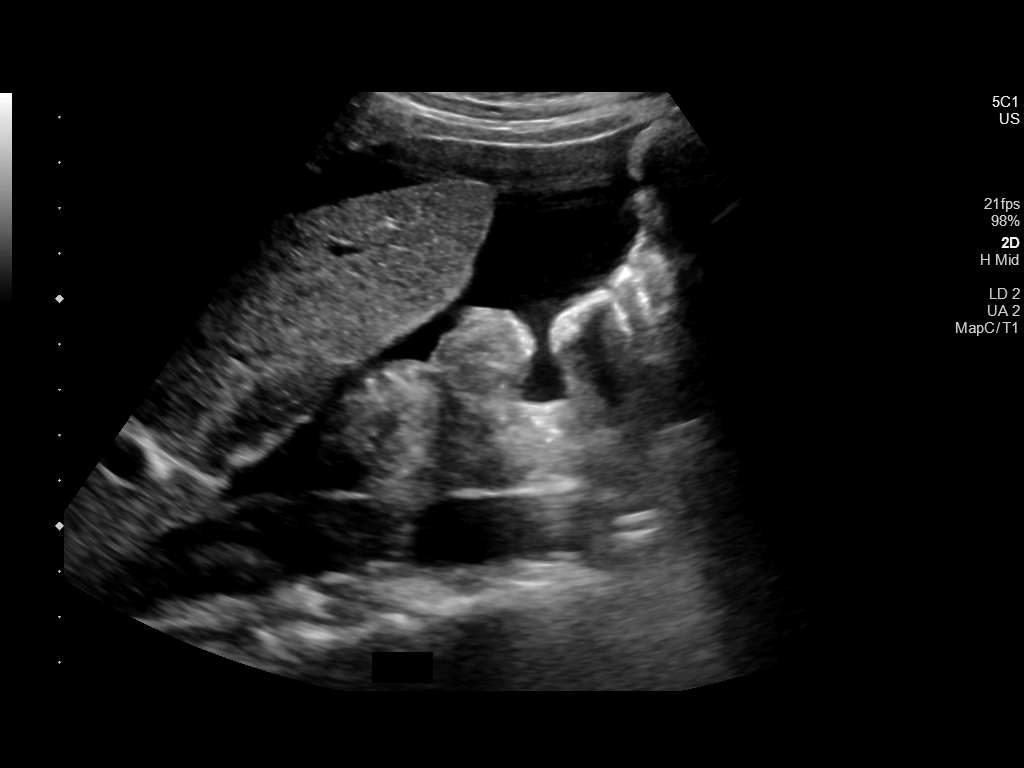
[im 2/5]
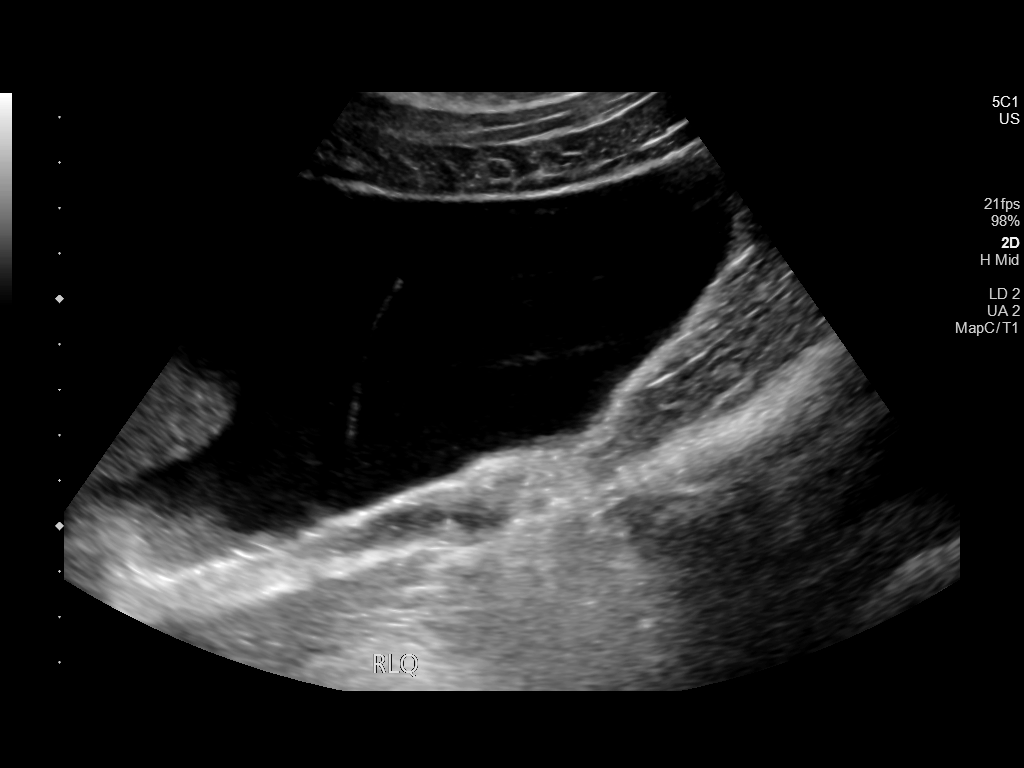
[im 3/5]
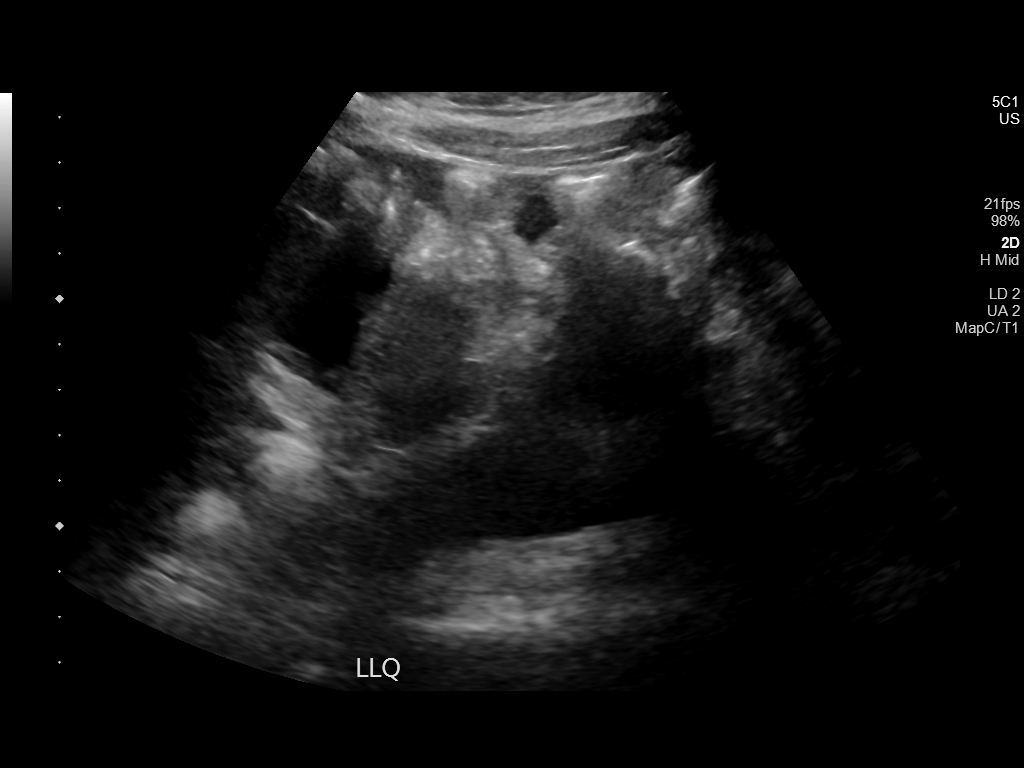
[im 4/5]
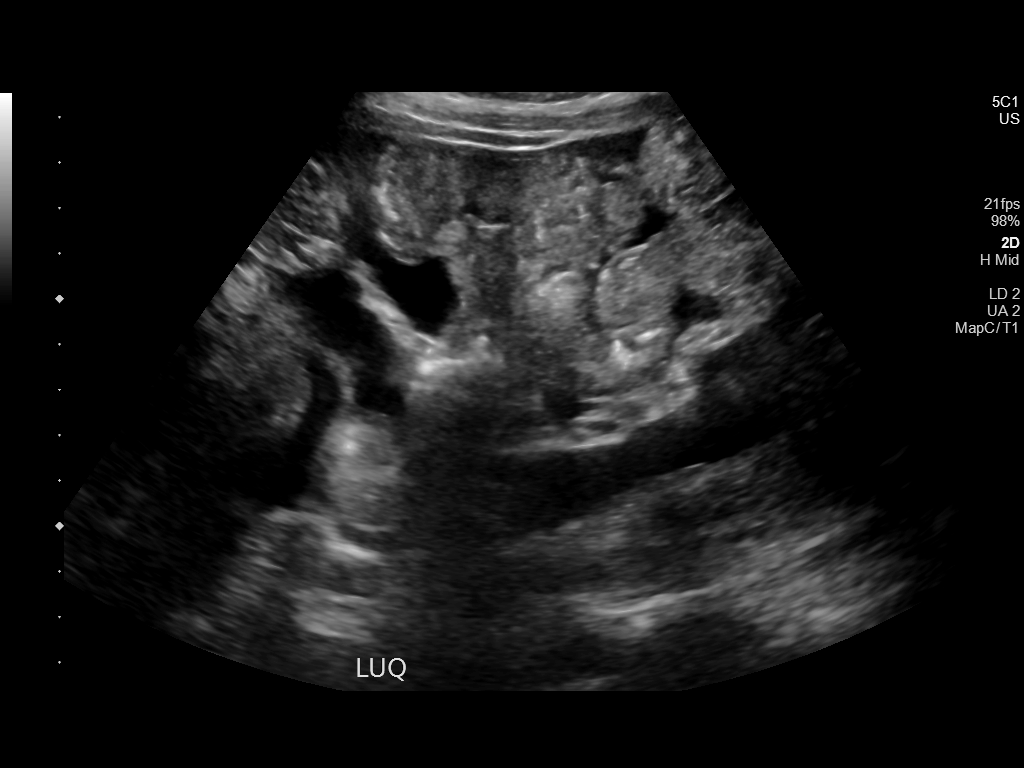
[im 5/5]
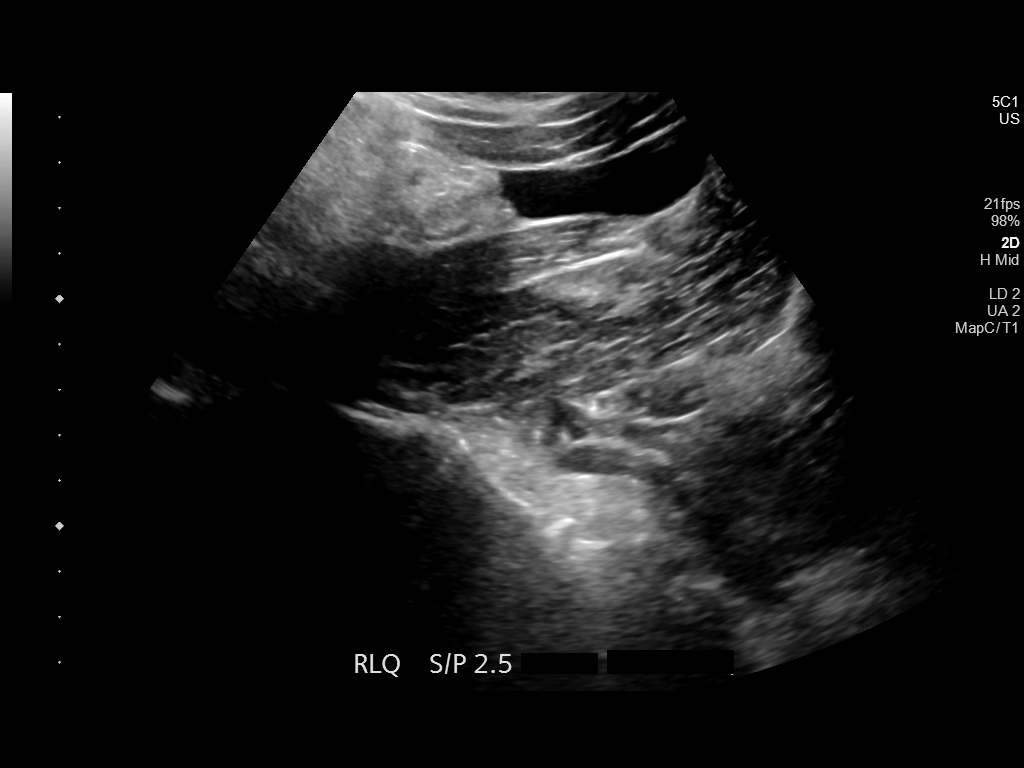

[5 of 5 positions shown; findings below may reference images not displayed]

EXAM:
ULTRASOUND GUIDED DIAGNOSTIC AND THERAPEUTIC PARACENTESIS

MEDICATIONS:
1% lidocaine to skin and subcutaneous tissue

COMPLICATIONS:
None immediate.

PROCEDURE:
Informed written consent was obtained from the patient after a
discussion of the risks, benefits and alternatives to treatment. A
timeout was performed prior to the initiation of the procedure.

Initial ultrasound scanning demonstrates a moderate amount of
ascites within the right mid to lower abdominal quadrant. The right
mid to lower abdomen was prepped and draped in the usual sterile
fashion. 1% lidocaine was used for local anesthesia.

Following this, a 19 gauge, 10-cm, Yueh catheter was introduced. An
ultrasound image was saved for documentation purposes. The
paracentesis was performed. The catheter was removed and a dressing
was applied. The patient tolerated the procedure well without
immediate post procedural complication.
FINDINGS: A total of approximately 2.5 liters of dark,bloody fluid was
removed. Samples were sent to the laboratory as requested by the
clinical team.
IMPRESSION: Successful ultrasound-guided diagnostic and therapeutic paracentesis
yielding 2.5 liters of peritoneal fluid.

## 2020-02-14 MED ORDER — LIDOCAINE HCL 1 % IJ SOLN
INTRAMUSCULAR | Status: AC
  Filled 2020-02-14: qty 20

## 2020-02-14 NOTE — Procedures (Signed)
Ultrasound-guided diagnostic and therapeutic paracentesis performed yielding 2.5 liters of dark,bloody fluid. No immediate complications. A portion of the fluid was sent to the lab for preordered studies. EBL none.

## 2020-02-16 LAB — LD, BODY FLUID (OTHER): LD, Body Fluid: 454 IU/L

## 2020-02-17 ENCOUNTER — Other Ambulatory Visit: Payer: Self-pay | Admitting: Gastroenterology

## 2020-02-17 DIAGNOSIS — R188 Other ascites: Secondary | ICD-10-CM

## 2020-02-17 LAB — CYTOLOGY - NON PAP

## 2020-02-18 LAB — BODY FLUID CULTURE
Culture: NO GROWTH
Special Requests: NORMAL

## 2020-02-28 ENCOUNTER — Ambulatory Visit
Admission: RE | Admit: 2020-02-28 | Discharge: 2020-02-28 | Disposition: A | Discharge status: Home / Self Care | Payer: BC Managed Care – PPO | Source: Ambulatory Visit | Attending: Gastroenterology | Admitting: Gastroenterology

## 2020-02-28 DIAGNOSIS — R188 Other ascites: Secondary | ICD-10-CM

## 2020-02-28 IMAGING — CT CT ABD-PELV W/ CM
2 of 4 series · 15 of 46 positions shown, 17 images · IV contrast (iopamidol)
Comparison: None.

CLINICAL DATA: Ascites.  Endometriosis.

EXAM:
CT ABDOMEN AND PELVIS WITH CONTRAST
TECHNIQUE: Multidetector CT imaging of the abdomen and pelvis was performed
using the standard protocol following bolus administration of
intravenous contrast.
CONTRAST:  100mL [QF] IOPAMIDOL ([QF]) INJECTION 61%

[Series 2: abd pelvis 5.00 br40 s3 axial · axial · 0.63mm/px · z∈[+1206,+1596]mm · 12 of 90 slices shown, 14 images]
[im 8/90  soft-tissue]
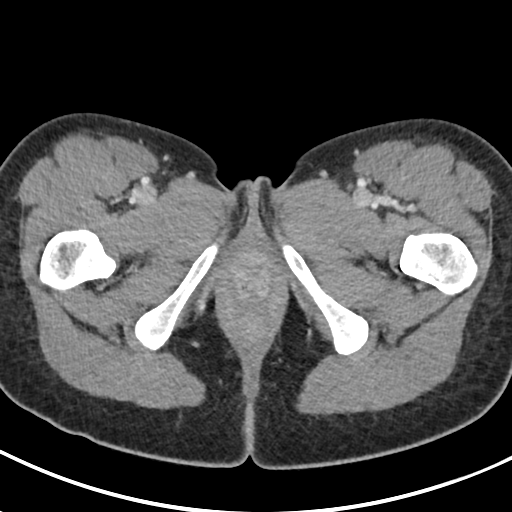
[im 8/90  bone]
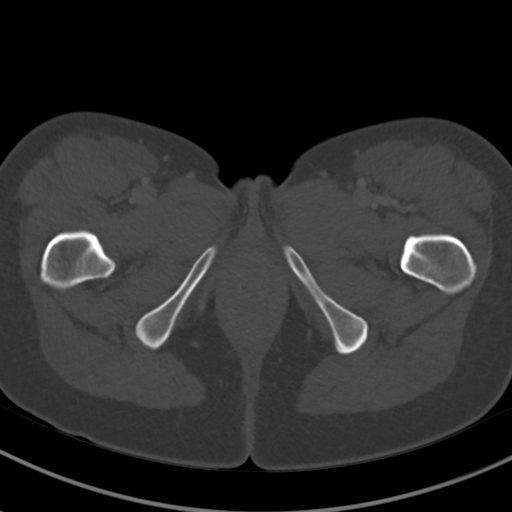
[im 15/90  soft-tissue]
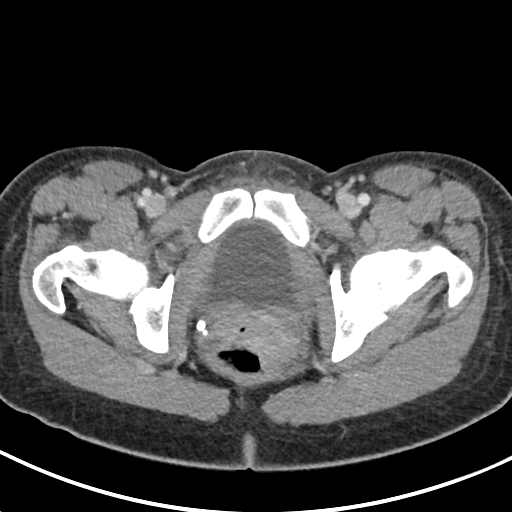
[im 22/90  soft-tissue]
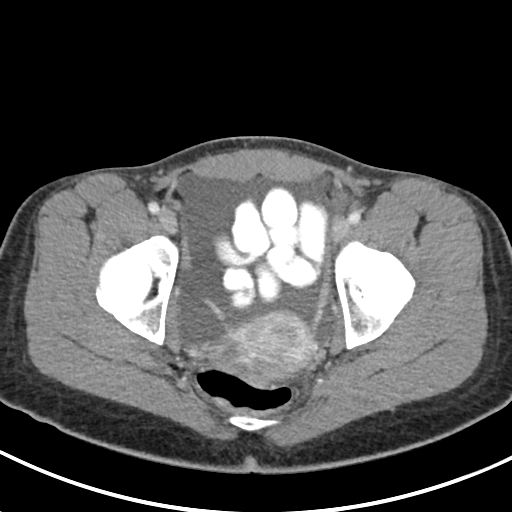
[im 29/90  soft-tissue]
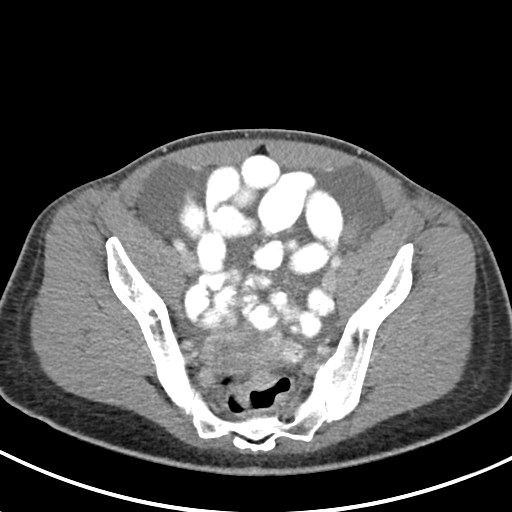
[im 36/90  soft-tissue]
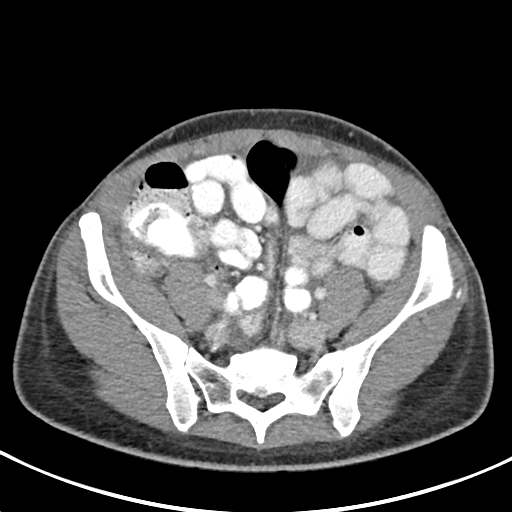
[im 43/90  soft-tissue]
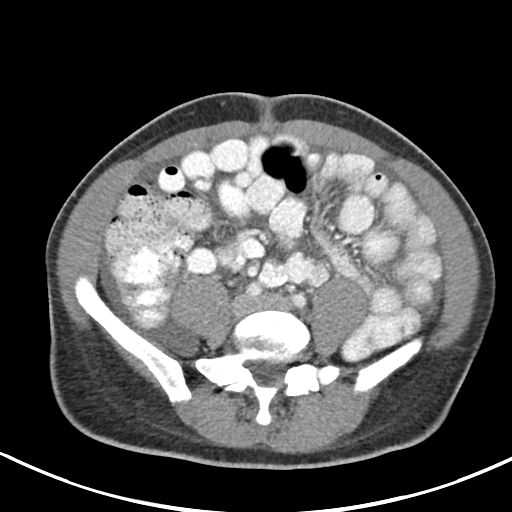
[im 50/90  soft-tissue]
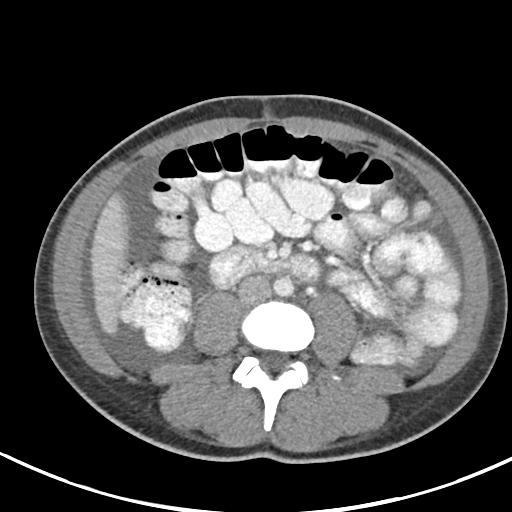
[im 57/90  soft-tissue]
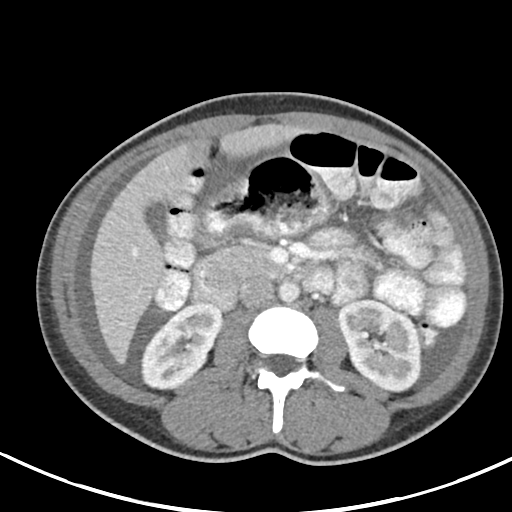
[im 65/90  soft-tissue]
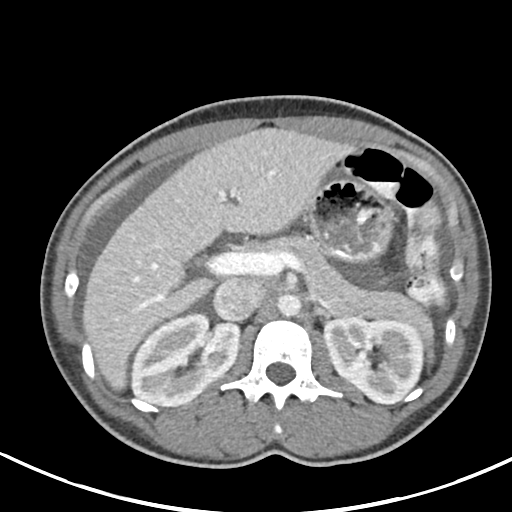
[im 65/90  bone]
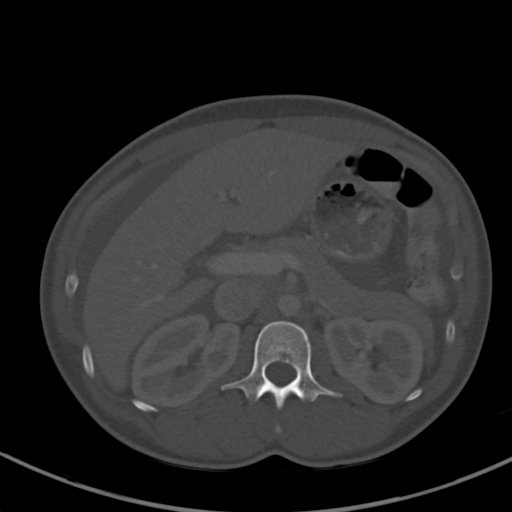
[im 72/90  soft-tissue]
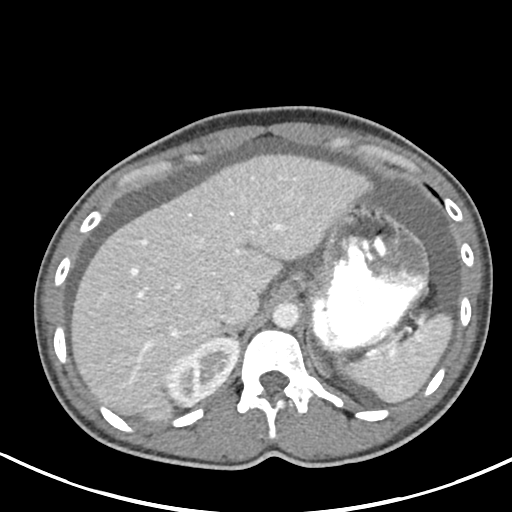
[im 79/90  soft-tissue]
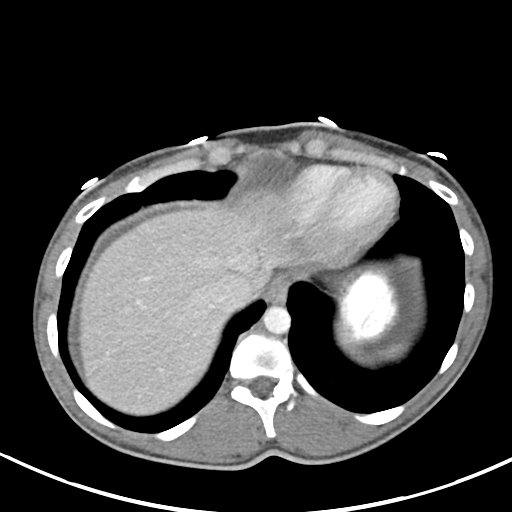
[im 86/90  soft-tissue]
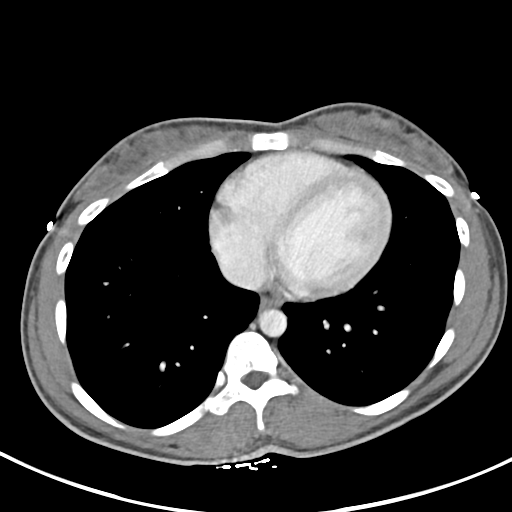

[Series 6: abd pelvis 2.00 br40 s3 cor · coronal · 0.63mm/px · 3 of 162 slices shown]
[im 54/162  soft-tissue]
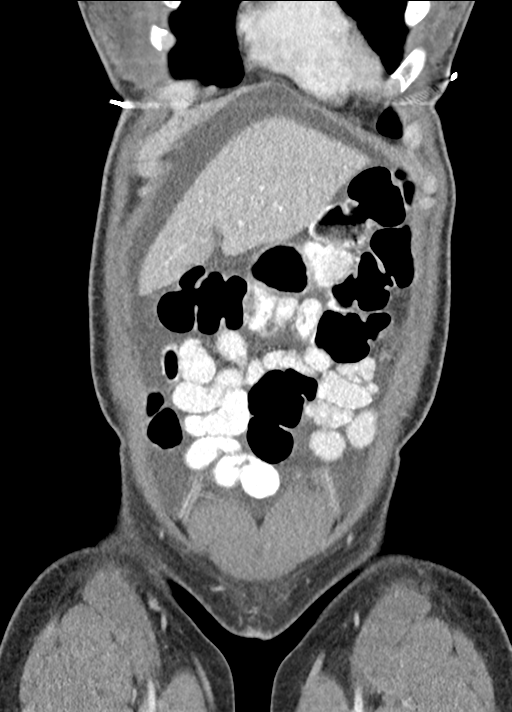
[im 72/162  soft-tissue]
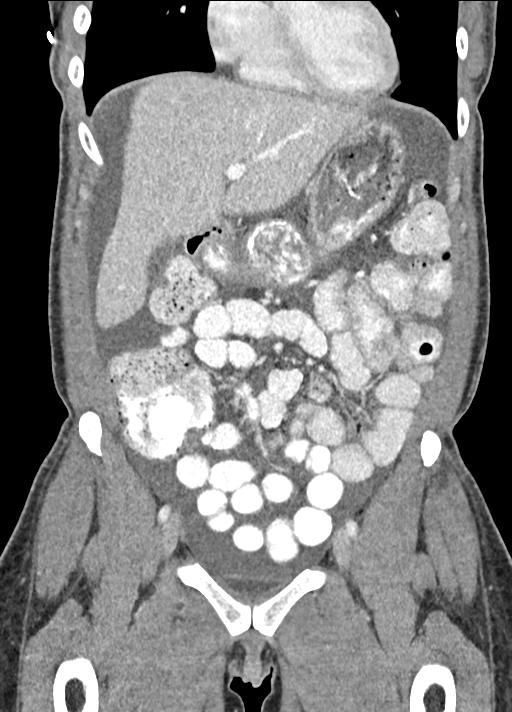
[im 90/162  soft-tissue]
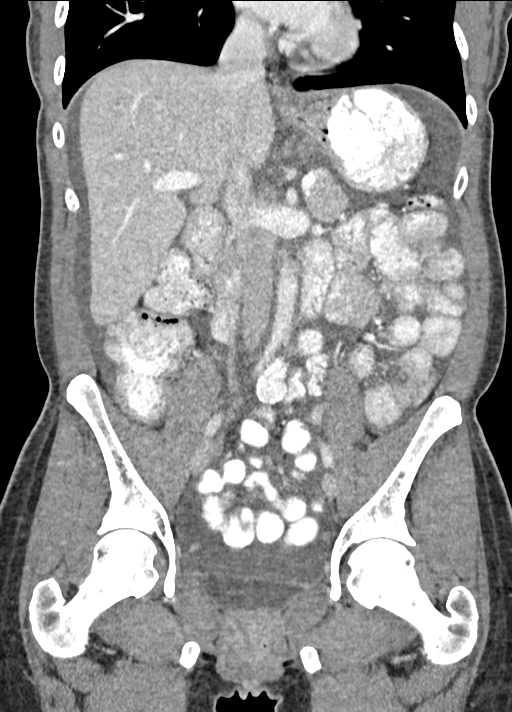

[15 of 46 positions shown; findings below may reference images not displayed]

FINDINGS: Lower Chest: A 5 mm ill-defined pulmonary nodule is seen in the
posterolateral left lower lobe on image [DATE].

Hepatobiliary: No hepatic masses identified. Focal fatty
infiltration noted adjacent to the falciform ligament. Gallbladder
is unremarkable. No evidence of biliary ductal dilatation.

Pancreas:  No mass or inflammatory changes.

Spleen: Within normal limits in size and appearance.

Adrenals/Urinary Tract: No masses identified. No evidence of
ureteral calculi or hydronephrosis.

Stomach/Bowel: No evidence of obstruction, inflammatory process or
abnormal fluid collections.

Vascular/Lymphatic: No pathologically enlarged lymph nodes. No
abdominal aortic aneurysm.

Reproductive: A complex cystic and solid mass is seen in the right
posterior adnexal region which abuts the uterus. This measures 6.4 x
4.3 cm and is highly suspicious for ovarian carcinoma.

Other: Moderate ascites is seen throughout the abdomen and pelvis.
No evidence of omental caking, however a lobulated soft tissue
density is seen involving the umbilicus in the anterior abdominal
wall which measures 2.6 x 2.2 cm on image 45/2. This raises
suspicion for metastatic disease.

Musculoskeletal:  No suspicious bone lesions identified.
IMPRESSION: 6.4 cm complex cystic and solid mass in right posterior adnexal
region, highly suspicious for ovarian carcinoma.

Moderate ascites.

2.6 cm soft tissue density involving the umbilicus in the anterior
abdominal wall, suspicious for metastatic disease.

5 mm indeterminate left lower lobe pulmonary nodule. Consider chest
CT for further evaluation.

These results will be called to the ordering clinician or
representative by the Radiologist Assistant, and communication
documented in the PACS or [REDACTED].

## 2020-02-28 MED ORDER — IOPAMIDOL (ISOVUE-300) INJECTION 61%
100.0000 mL | Freq: Once | INTRAVENOUS | Status: AC | PRN
  Administered 2020-02-28: 100 mL via INTRAVENOUS

## 2020-03-05 ENCOUNTER — Telehealth: Payer: Self-pay | Admitting: *Deleted

## 2020-03-05 NOTE — Telephone Encounter (Signed)
Called the patient and scheduled a new patient appt for 9/8 at 9 am with Dr Pricilla Holm. Gave the patient the address and phone number for the clinic. Also gave the policy for mask, visitors and parking.

## 2020-03-10 NOTE — Progress Notes (Signed)
GYNECOLOGIC ONCOLOGY NEW PATIENT CONSULTATION   Patient Name: Angelica Blankenship  Patient Age: 29 y.o. Date of Service: 03/11/20 Referring Provider: Noland Fordyce MD  Primary Care Provider: Marcine Matar, MD Consulting Provider: Eugene Garnet, MD   Assessment/Plan:  Premenopausal patient with history of known endometriosis presenting with a complex adnexal mass and ascites suspected to be related to endometriosis.  Reviewed her recent CT findings showing a complex adnexal mass as well as reaccumulating ascites.  I suspect that this is all related to the same process.  While a borderline or malignant process cannot be ruled out, I think in the setting of her history, this is likely caused by her endometriosis.  I do not see evidence of peritoneal or mesenteric disease on her imaging.  The umbilical implant that is noted on CT scan is palpable on exam and per the patient's report has been there since her surgery.  Her operative note from 2016 describes a difficult entry with bleeding caused at the umbilicus.  I suspect that this is either related to scar tissue that developed immediately after surgery versus an endometriosis implant.  The patient's history suggest that she responds to hormonal therapy.  Her significant symptoms started after she came off of oral contraceptive pills in the past.  I think that given her current presentation, she warrants at least diagnostic surgery for confirmation that this is not a malignancy.  I recommended diagnostic laparoscopy with drainage of ascites, and photodocumentation of her upper abdomen and pelvis.  If her adnexal mass is at all concerning, then I will either biopsy or excise it to send for pathologic review.  I discussed the possibility of excision of her umbilical mass.  Given its proximity to the umbilicus itself, I think it would be quite difficult to remove it without compromising the blood supply, and I think there is a high likelihood I would  have to excise her umbilicus.  The patient's preference is to avoid this and I am hopeful that postop with some hormone therapy that this area may shrink or completely respond if it is an endometriosis implant.  I think the patient would benefit from referral to a specialist to see if complex endometriosis cases.  I discussed referral to one of the [facility] providers who works at [employer] postop.  We will plan for surgery next Thursday, 9/16.  We discussed plan for a diagnostic laparoscopy, possible robotic assisted treatment of endometriosis lesions (excision versus cauterization), possible unilateral oophorectomy versus unilateral salpingo-oophorectomy, possible laparotomy. The risks of surgery were discussed in detail and she understands these to include infection; wound separation; hernia; injury to adjacent organs such as bowel, bladder, blood vessels, ureters and nerves; bleeding which may require blood transfusion; anesthesia risk; thromboembolic events; possible death; unforeseen complications; possible need for re-exploration; medical complications such as heart attack, stroke, pleural effusion and pneumonia; and, if full lymphadenectomy is performed the risk of lymphedema and lymphocyst. The patient will receive DVT and antibiotic prophylaxis as indicated. She voiced a clear understanding. She had the opportunity to ask questions. Perioperative instructions were reviewed with her. Prescriptions for post-op medications were sent to her pharmacy of choice.  A copy of this note was sent to the patient's referring provider.   60 minutes of total time was spent for this patient encounter, including preparation, face-to-face counseling with the patient and coordination of care, and documentation of the encounter.   Eugene Garnet, MD  Division of Gynecologic Oncology  Department of Obstetrics and Gynecology  [facility]  ___________________________________________   Chief Complaint: No chief complaint on file.   History of Present Illness:  Angelica Blankenship is a 29 y.o. y.o. female who is seen in consultation at the request of Dr. Ernestina Penna for an evaluation of an adnexal mass and ascites.   The patient endorses a long history of being on Depo-Provera for 5 or 6 years followed by birth control pills.  She bled all the time on Depo but denies any abdominal symptoms including pain.  When she came off of the oral birth control pills, she developed symptoms with her menses.  She describes this is having regular menses about every month that last for 5 days.  She denies any heavy bleeding or passage of clots.  She has significant pain during her menses, especially the first 2 days of bleeding.  She uses Tylenol extra strength and Excedrin which did not help her pain.  She finds heating pad is the most helpful.  She notes some improvement in her symptoms after undergoing robotic surgery at which time endometriosis implants were treated.  She underwent robotic diagnostic laparoscopy, excision of pelvic endometriosis, lysis of adhesions and chromopertubation with Dr. Cherly Hensen in December 2016.Marland Kitchen Findings at the time of surgery were adhesions involving the uterus and bilateral adnexa. Peritoneal biopsy from surgery confirmed endometriosis.  She remembers having a discussion after surgery about being on hormones but has not been on any birth control since that time.  Shortly after surgery, she developed a knot at her umbilicus which is painful to the touch.  She denies any change to tenderness or size of this area since her surgery.  In mid February, she began to have abdominal bloating, intermittent pain and fullness.  She also endorses decreasing appetite and early satiety.  She presented to the ED on 09/22/19 with abdominal pain.  She was then seen by her OB/GYN and a gastroenterologist.  She underwent ultrasound exams at both clinics which confirmed ascites.  She underwent  paracentesis on 6/30 with 3L of dark red colored fluid removed. Cytology revealed no malignant cells, reactive mesothelial cells present.  She has also seen a cardiologist for work-up of her ascites. Repeat paracentesis on 8/13 after she redeveloped abdominal symptoms and was noted to have recurrent ascites with 2.5L of dark bloody fluid removed. Cytology showed reactive mesothelial cells, mixed lymphoid population.   Currently, since her last paracentesis, she is not feeling abdominal bloating.  Her appetite has improved some.  She endorses nausea and emesis only with her cycle and intense pain.  Her last menstrual period started on Friday 9/2.  She notes normal bowel and bladder function.  Her history is notable for 2 early miscarriages.  She would ultimately like to try to get pregnant again in the near future.  She lives in [place] with her fianc.  She endorses frequent THC use, denies any tobacco use and endorses occasional alcohol use.  PAST MEDICAL HISTORY:  Past Medical History:  Diagnosis Date  . Ascites   . Endometriosis   . Headache   . Heart murmur    asymptomatic     PAST SURGICAL HISTORY:  Past Surgical History:  Procedure Laterality Date  . IR PARACENTESIS  01/01/2020  . LAPAROSCOPY N/A 06/18/2015   Procedure: LAPAROSCOPY DIAGNOSTIC;  Surgeon: Maxie Better, MD;  Location: [facility];  Service: Gynecology;  Laterality: N/A;  90 min. requested  . ROBOTIC ASSISTED DIAGNOSTIC LAPAROSCOPY N/A 06/18/2015   Procedure:  ROBOTIC ASSISTED DIAGNOSTIC LAPAROSCOPY  WITH EXCISION OF PELVIC ENDOMETRIOSIS and lysis of adhesions, chromopertubation;  Surgeon: Maxie Better, MD;  Location: [facility];  Service: Gynecology;  Laterality: N/A;    OB/GYN HISTORY:  OB History  Gravida Para Term Preterm AB Living  2       2    SAB TAB Ectopic Multiple Live Births  1 1   0      # Outcome Date GA Lbr Len/2nd Weight Sex Delivery Anes PTL Lv  2 SAB 08/04/14          1 TAB              No LMP recorded.  Age at menarche: 20  Age at menopause: n/a Hx of HRT: no Hx of STDs: denies Last pap: 10/2016 History of abnormal pap smears: no  SCREENING STUDIES:  Last mammogram: n/a  Last colonoscopy: n/a Last bone mineral density: n/a  MEDICATIONS: Outpatient Encounter Medications as of 03/11/2020  Medication Sig  . acetaminophen (TYLENOL) 500 MG tablet Take 500 mg by mouth every 6 (six) hours as needed.  Marland Kitchen aspirin-acetaminophen-caffeine (EXCEDRIN MIGRAINE) 250-250-65 MG tablet Take 1 tablet by mouth every 6 (six) hours as needed for headache.  . ibuprofen (ADVIL) 800 MG tablet Take 1 tablet (800 mg total) by mouth every 8 (eight) hours as needed for moderate pain. For AFTER surgery only  . oxyCODONE (OXY IR/ROXICODONE) 5 MG immediate release tablet Take 1 tablet (5 mg total) by mouth every 4 (four) hours as needed for severe pain. For AFTER surgery only, do not take and drive  . senna-docusate (SENOKOT-S) 8.6-50 MG tablet Take 2 tablets by mouth at bedtime. For AFTER surgery, do not take if having diarrhea   No facility-administered encounter medications on file as of 03/11/2020.    ALLERGIES:  No Known Allergies   FAMILY HISTORY:  Family History  Problem Relation Age of Onset  . Hypertension Mother   . Hypertension Sister   . Breast cancer Maternal Aunt   . Hypertension Maternal Grandmother   . Cervical cancer Maternal Grandmother   . Ovarian cancer Neg Hx   . Uterine cancer Neg Hx   . Colon cancer Neg Hx      SOCIAL HISTORY:    Social Connections:   . Frequency of Communication with Friends and Family: Not on file  . Frequency of Social Gatherings with Friends and Family: Not on file  . Attends Religious Services: Not on file  . Active Member of Clubs or Organizations: Not on file  . Attends Banker Meetings: Not on file  . Marital Status: Not on file    REVIEW OF SYSTEMS:  Decreased appetite, abdominal pain and bloating, early satiety,  menstrual problems. Denies fevers, chills, fatigue, unexplained weight changes. Denies hearing loss, neck lumps or masses, mouth sores, ringing in ears or voice changes. Denies cough or wheezing.  Denies shortness of breath. Denies chest pain or palpitations. Denies leg swelling. Denies blood in stools, constipation, diarrhea, nausea, vomiting. Denies pain with intercourse, dysuria, frequency, hematuria or incontinence. Denies hot flashes, pelvic pain, vaginal bleeding or vaginal discharge.   Denies joint pain, back pain or muscle pain/cramps. Denies itching, rash, or wounds. Denies dizziness, headaches, numbness or seizures. Denies swollen lymph nodes or glands, denies easy bruising or bleeding. Denies anxiety, depression, confusion, or decreased concentration.  Physical Exam:  Vital Signs for this encounter:  Blood pressure (!) 145/91, pulse 68, temperature 98.1 F (36.7 C), temperature source Tympanic, resp. rate 18, height 5'  2" (1.575 m), weight 142 lb 3.2 oz (64.5 kg), SpO2 100 %, unknown if currently breastfeeding. Body mass index is 26.01 kg/m. General: Alert, oriented, no acute distress.  HEENT: Normocephalic, atraumatic. Sclera anicteric.  Chest: Clear to auscultation bilaterally. No wheezes, rhonchi, or rales. Cardiovascular: Regular rate and rhythm, no murmurs, rubs, or gallops.  Abdomen: Normoactive bowel sounds. Soft, mildly distended, tender to palpation at umbilicus where there is a palpable 2-3cm firm mass. No hepatosplenomegaly appreciated. + palpable fluid wave.  Extremities: Grossly normal range of motion. Warm, well perfused. No edema bilaterally.  Skin: No rashes or lesions.  Lymphatics: No cervical, supraclavicular, or inguinal adenopathy.  GU:  Normal external female genitalia. No lesions. No discharge or bleeding.             Bladder/urethra:  No lesions or masses, well supported bladder             Vagina: well rugated vaginal mucosa. No lesions or bleeding.              Cervix: Normal appearing, no lesions.             Uterus: Small, mobile, no parametrial involvement or nodularity.              Adnexa: Fullness in the cul de sac that moves in conjunction with the uterus, smooth, no nodularity.   LABORATORY AND RADIOLOGIC DATA:  Outside medical records were reviewed to synthesize the above history, along with the history and physical obtained during the visit.   Lab Results  Component Value Date   WBC 5.9 06/17/2015   HGB 11.4 (L) 06/17/2015   HCT 33.0 (L) 06/17/2015   PLT 218 06/17/2015   GLUCOSE 107 (H) 02/20/2009   NA 141 02/20/2009   K 4.0 02/20/2009   CL 106 02/20/2009   CREATININE 0.6 02/20/2009   BUN 7 02/20/2009   CA-125 on 6/7: 34.1 CA 19-9: 21 CEA: 1.7  CT A/P on 8/27: 6.4 cm complex cystic and solid mass in right posterior adnexal region, highly suspicious for ovarian carcinoma.  Moderate ascites.  2.6 cm soft tissue density involving the umbilicus in the anterior abdominal wall, suspicious for metastatic disease.  5 mm indeterminate left lower lobe pulmonary nodule. Consider chest CT for further evaluation. 

## 2020-03-10 NOTE — H&P (View-Only) (Signed)
GYNECOLOGIC ONCOLOGY NEW PATIENT CONSULTATION   Patient Name: Angelica Blankenship  Patient Age: 29 y.o. Date of Service: 03/11/20 Referring Provider: Noland Fordyce MD  Primary Care Provider: Marcine Matar, MD Consulting Provider: Eugene Garnet, MD   Assessment/Plan:  Premenopausal patient with history of known endometriosis presenting with a complex adnexal mass and ascites suspected to be related to endometriosis.  Reviewed her recent CT findings showing a complex adnexal mass as well as reaccumulating ascites.  I suspect that this is all related to the same process.  While a borderline or malignant process cannot be ruled out, I think in the setting of her history, this is likely caused by her endometriosis.  I do not see evidence of peritoneal or mesenteric disease on her imaging.  The umbilical implant that is noted on CT scan is palpable on exam and per the patient's report has been there since her surgery.  Her operative note from 2016 describes a difficult entry with bleeding caused at the umbilicus.  I suspect that this is either related to scar tissue that developed immediately after surgery versus an endometriosis implant.  The patient's history suggest that she responds to hormonal therapy.  Her significant symptoms started after she came off of oral contraceptive pills in the past.  I think that given her current presentation, she warrants at least diagnostic surgery for confirmation that this is not a malignancy.  I recommended diagnostic laparoscopy with drainage of ascites, and photodocumentation of her upper abdomen and pelvis.  If her adnexal mass is at all concerning, then I will either biopsy or excise it to send for pathologic review.  I discussed the possibility of excision of her umbilical mass.  Given its proximity to the umbilicus itself, I think it would be quite difficult to remove it without compromising the blood supply, and I think there is a high likelihood I would  have to excise her umbilicus.  The patient's preference is to avoid this and I am hopeful that postop with some hormone therapy that this area may shrink or completely respond if it is an endometriosis implant.  I think the patient would benefit from referral to a specialist to see if complex endometriosis cases.  I discussed referral to one of the [facility] providers who works at [employer] postop.  We will plan for surgery next Thursday, 9/16.  We discussed plan for a diagnostic laparoscopy, possible robotic assisted treatment of endometriosis lesions (excision versus cauterization), possible unilateral oophorectomy versus unilateral salpingo-oophorectomy, possible laparotomy. The risks of surgery were discussed in detail and she understands these to include infection; wound separation; hernia; injury to adjacent organs such as bowel, bladder, blood vessels, ureters and nerves; bleeding which may require blood transfusion; anesthesia risk; thromboembolic events; possible death; unforeseen complications; possible need for re-exploration; medical complications such as heart attack, stroke, pleural effusion and pneumonia; and, if full lymphadenectomy is performed the risk of lymphedema and lymphocyst. The patient will receive DVT and antibiotic prophylaxis as indicated. She voiced a clear understanding. She had the opportunity to ask questions. Perioperative instructions were reviewed with her. Prescriptions for post-op medications were sent to her pharmacy of choice.  A copy of this note was sent to the patient's referring provider.   60 minutes of total time was spent for this patient encounter, including preparation, face-to-face counseling with the patient and coordination of care, and documentation of the encounter.   Eugene Garnet, MD  Division of Gynecologic Oncology  Department of Obstetrics and Gynecology  [facility]  ___________________________________________   Chief Complaint: No chief complaint on file.   History of Present Illness:  Angelica Blankenship is a 29 y.o. y.o. female who is seen in consultation at the request of Dr. Ernestina Penna for an evaluation of an adnexal mass and ascites.   The patient endorses a long history of being on Depo-Provera for 5 or 6 years followed by birth control pills.  She bled all the time on Depo but denies any abdominal symptoms including pain.  When she came off of the oral birth control pills, she developed symptoms with her menses.  She describes this is having regular menses about every month that last for 5 days.  She denies any heavy bleeding or passage of clots.  She has significant pain during her menses, especially the first 2 days of bleeding.  She uses Tylenol extra strength and Excedrin which did not help her pain.  She finds heating pad is the most helpful.  She notes some improvement in her symptoms after undergoing robotic surgery at which time endometriosis implants were treated.  She underwent robotic diagnostic laparoscopy, excision of pelvic endometriosis, lysis of adhesions and chromopertubation with Dr. Cherly Hensen in December 2016.Marland Kitchen Findings at the time of surgery were adhesions involving the uterus and bilateral adnexa. Peritoneal biopsy from surgery confirmed endometriosis.  She remembers having a discussion after surgery about being on hormones but has not been on any birth control since that time.  Shortly after surgery, she developed a knot at her umbilicus which is painful to the touch.  She denies any change to tenderness or size of this area since her surgery.  In mid February, she began to have abdominal bloating, intermittent pain and fullness.  She also endorses decreasing appetite and early satiety.  She presented to the ED on 09/22/19 with abdominal pain.  She was then seen by her OB/GYN and a gastroenterologist.  She underwent ultrasound exams at both clinics which confirmed ascites.  She underwent  paracentesis on 6/30 with 3L of dark red colored fluid removed. Cytology revealed no malignant cells, reactive mesothelial cells present.  She has also seen a cardiologist for work-up of her ascites. Repeat paracentesis on 8/13 after she redeveloped abdominal symptoms and was noted to have recurrent ascites with 2.5L of dark bloody fluid removed. Cytology showed reactive mesothelial cells, mixed lymphoid population.   Currently, since her last paracentesis, she is not feeling abdominal bloating.  Her appetite has improved some.  She endorses nausea and emesis only with her cycle and intense pain.  Her last menstrual period started on Friday 9/2.  She notes normal bowel and bladder function.  Her history is notable for 2 early miscarriages.  She would ultimately like to try to get pregnant again in the near future.  She lives in [place] with her fianc.  She endorses frequent THC use, denies any tobacco use and endorses occasional alcohol use.  PAST MEDICAL HISTORY:  Past Medical History:  Diagnosis Date  . Ascites   . Endometriosis   . Headache   . Heart murmur    asymptomatic     PAST SURGICAL HISTORY:  Past Surgical History:  Procedure Laterality Date  . IR PARACENTESIS  01/01/2020  . LAPAROSCOPY N/A 06/18/2015   Procedure: LAPAROSCOPY DIAGNOSTIC;  Surgeon: Maxie Better, MD;  Location: [facility];  Service: Gynecology;  Laterality: N/A;  90 min. requested  . ROBOTIC ASSISTED DIAGNOSTIC LAPAROSCOPY N/A 06/18/2015   Procedure:  ROBOTIC ASSISTED DIAGNOSTIC LAPAROSCOPY  WITH EXCISION OF PELVIC ENDOMETRIOSIS and lysis of adhesions, chromopertubation;  Surgeon: Maxie Better, MD;  Location: [facility];  Service: Gynecology;  Laterality: N/A;    OB/GYN HISTORY:  OB History  Gravida Para Term Preterm AB Living  2       2    SAB TAB Ectopic Multiple Live Births  1 1   0      # Outcome Date GA Lbr Len/2nd Weight Sex Delivery Anes PTL Lv  2 SAB 08/04/14          1 TAB              No LMP recorded.  Age at menarche: 20  Age at menopause: n/a Hx of HRT: no Hx of STDs: denies Last pap: 10/2016 History of abnormal pap smears: no  SCREENING STUDIES:  Last mammogram: n/a  Last colonoscopy: n/a Last bone mineral density: n/a  MEDICATIONS: Outpatient Encounter Medications as of 03/11/2020  Medication Sig  . acetaminophen (TYLENOL) 500 MG tablet Take 500 mg by mouth every 6 (six) hours as needed.  Marland Kitchen aspirin-acetaminophen-caffeine (EXCEDRIN MIGRAINE) 250-250-65 MG tablet Take 1 tablet by mouth every 6 (six) hours as needed for headache.  . ibuprofen (ADVIL) 800 MG tablet Take 1 tablet (800 mg total) by mouth every 8 (eight) hours as needed for moderate pain. For AFTER surgery only  . oxyCODONE (OXY IR/ROXICODONE) 5 MG immediate release tablet Take 1 tablet (5 mg total) by mouth every 4 (four) hours as needed for severe pain. For AFTER surgery only, do not take and drive  . senna-docusate (SENOKOT-S) 8.6-50 MG tablet Take 2 tablets by mouth at bedtime. For AFTER surgery, do not take if having diarrhea   No facility-administered encounter medications on file as of 03/11/2020.    ALLERGIES:  No Known Allergies   FAMILY HISTORY:  Family History  Problem Relation Age of Onset  . Hypertension Mother   . Hypertension Sister   . Breast cancer Maternal Aunt   . Hypertension Maternal Grandmother   . Cervical cancer Maternal Grandmother   . Ovarian cancer Neg Hx   . Uterine cancer Neg Hx   . Colon cancer Neg Hx      SOCIAL HISTORY:    Social Connections:   . Frequency of Communication with Friends and Family: Not on file  . Frequency of Social Gatherings with Friends and Family: Not on file  . Attends Religious Services: Not on file  . Active Member of Clubs or Organizations: Not on file  . Attends Banker Meetings: Not on file  . Marital Status: Not on file    REVIEW OF SYSTEMS:  Decreased appetite, abdominal pain and bloating, early satiety,  menstrual problems. Denies fevers, chills, fatigue, unexplained weight changes. Denies hearing loss, neck lumps or masses, mouth sores, ringing in ears or voice changes. Denies cough or wheezing.  Denies shortness of breath. Denies chest pain or palpitations. Denies leg swelling. Denies blood in stools, constipation, diarrhea, nausea, vomiting. Denies pain with intercourse, dysuria, frequency, hematuria or incontinence. Denies hot flashes, pelvic pain, vaginal bleeding or vaginal discharge.   Denies joint pain, back pain or muscle pain/cramps. Denies itching, rash, or wounds. Denies dizziness, headaches, numbness or seizures. Denies swollen lymph nodes or glands, denies easy bruising or bleeding. Denies anxiety, depression, confusion, or decreased concentration.  Physical Exam:  Vital Signs for this encounter:  Blood pressure (!) 145/91, pulse 68, temperature 98.1 F (36.7 C), temperature source Tympanic, resp. rate 18, height 5'  2" (1.575 m), weight 142 lb 3.2 oz (64.5 kg), SpO2 100 %, unknown if currently breastfeeding. Body mass index is 26.01 kg/m. General: Alert, oriented, no acute distress.  HEENT: Normocephalic, atraumatic. Sclera anicteric.  Chest: Clear to auscultation bilaterally. No wheezes, rhonchi, or rales. Cardiovascular: Regular rate and rhythm, no murmurs, rubs, or gallops.  Abdomen: Normoactive bowel sounds. Soft, mildly distended, tender to palpation at umbilicus where there is a palpable 2-3cm firm mass. No hepatosplenomegaly appreciated. + palpable fluid wave.  Extremities: Grossly normal range of motion. Warm, well perfused. No edema bilaterally.  Skin: No rashes or lesions.  Lymphatics: No cervical, supraclavicular, or inguinal adenopathy.  GU:  Normal external female genitalia. No lesions. No discharge or bleeding.             Bladder/urethra:  No lesions or masses, well supported bladder             Vagina: well rugated vaginal mucosa. No lesions or bleeding.              Cervix: Normal appearing, no lesions.             Uterus: Small, mobile, no parametrial involvement or nodularity.              Adnexa: Fullness in the cul de sac that moves in conjunction with the uterus, smooth, no nodularity.   LABORATORY AND RADIOLOGIC DATA:  Outside medical records were reviewed to synthesize the above history, along with the history and physical obtained during the visit.   Lab Results  Component Value Date   WBC 5.9 06/17/2015   HGB 11.4 (L) 06/17/2015   HCT 33.0 (L) 06/17/2015   PLT 218 06/17/2015   GLUCOSE 107 (H) 02/20/2009   NA 141 02/20/2009   K 4.0 02/20/2009   CL 106 02/20/2009   CREATININE 0.6 02/20/2009   BUN 7 02/20/2009   CA-125 on 6/7: 34.1 CA 19-9: 21 CEA: 1.7  CT A/P on 8/27: 6.4 cm complex cystic and solid mass in right posterior adnexal region, highly suspicious for ovarian carcinoma.  Moderate ascites.  2.6 cm soft tissue density involving the umbilicus in the anterior abdominal wall, suspicious for metastatic disease.  5 mm indeterminate left lower lobe pulmonary nodule. Consider chest CT for further evaluation.

## 2020-03-11 ENCOUNTER — Encounter: Payer: Self-pay | Admitting: Gynecologic Oncology

## 2020-03-11 ENCOUNTER — Other Ambulatory Visit: Payer: Self-pay | Admitting: Gynecologic Oncology

## 2020-03-11 ENCOUNTER — Other Ambulatory Visit: Payer: Self-pay

## 2020-03-11 ENCOUNTER — Inpatient Hospital Stay: Payer: BC Managed Care – PPO | Attending: Gynecologic Oncology | Admitting: Gynecologic Oncology

## 2020-03-11 VITALS — BP 145/91 | HR 68 | Temp 98.1°F | Resp 18 | Ht 62.0 in | Wt 142.2 lb

## 2020-03-11 DIAGNOSIS — Z7982 Long term (current) use of aspirin: Secondary | ICD-10-CM | POA: Diagnosis not present

## 2020-03-11 DIAGNOSIS — R918 Other nonspecific abnormal finding of lung field: Secondary | ICD-10-CM | POA: Diagnosis not present

## 2020-03-11 DIAGNOSIS — N809 Endometriosis, unspecified: Secondary | ICD-10-CM | POA: Diagnosis not present

## 2020-03-11 DIAGNOSIS — Z803 Family history of malignant neoplasm of breast: Secondary | ICD-10-CM | POA: Insufficient documentation

## 2020-03-11 DIAGNOSIS — Z8249 Family history of ischemic heart disease and other diseases of the circulatory system: Secondary | ICD-10-CM | POA: Insufficient documentation

## 2020-03-11 DIAGNOSIS — R188 Other ascites: Secondary | ICD-10-CM | POA: Insufficient documentation

## 2020-03-11 DIAGNOSIS — N9489 Other specified conditions associated with female genital organs and menstrual cycle: Secondary | ICD-10-CM | POA: Insufficient documentation

## 2020-03-11 DIAGNOSIS — Z791 Long term (current) use of non-steroidal anti-inflammatories (NSAID): Secondary | ICD-10-CM | POA: Insufficient documentation

## 2020-03-11 DIAGNOSIS — R1909 Other intra-abdominal and pelvic swelling, mass and lump: Secondary | ICD-10-CM | POA: Diagnosis present

## 2020-03-11 DIAGNOSIS — Z79899 Other long term (current) drug therapy: Secondary | ICD-10-CM | POA: Insufficient documentation

## 2020-03-11 DIAGNOSIS — Z8049 Family history of malignant neoplasm of other genital organs: Secondary | ICD-10-CM | POA: Diagnosis not present

## 2020-03-11 MED ORDER — SENNOSIDES-DOCUSATE SODIUM 8.6-50 MG PO TABS: 2.0000 | ORAL_TABLET | Freq: Every day | ORAL | 0 refills | Status: AC

## 2020-03-11 MED ORDER — IBUPROFEN 800 MG PO TABS: 800.0000 mg | ORAL_TABLET | Freq: Three times a day (TID) | ORAL | 0 refills | Status: DC | PRN

## 2020-03-11 MED ORDER — OXYCODONE HCL 5 MG PO TABS: 5.0000 mg | ORAL_TABLET | ORAL | 0 refills | Status: DC | PRN

## 2020-03-11 NOTE — Patient Instructions (Signed)
Preparing for your Surgery  Plan for surgery on March 19, 2020 with Dr. Eugene Garnet at [facility]. You will be scheduled for a diagnostic laparoscopy, drainage of ascites, possible biopsies, possible robotic assisted laparoscopic unilateral oophorectomy (removal of one ovary), possible unilateral salpingo-oophorectomy (removal of one ovary and fallopian tube), possible exploratory laparotomy (open incision on the abdomen).   Pre-operative Testing -You will receive a phone call from presurgical testing at [facility] to arrange for a pre-operative appointment over the phone, lab appointment, and COVID test. The COVID test normally happens 3 days prior to the surgery and they ask that you self quarantine after the test up until surgery to decrease chance of exposure.  -Bring your insurance card, copy of an advanced directive if applicable, medication list  -At that visit, you will be asked to sign a consent for a possible blood transfusion in case a transfusion becomes necessary during surgery.  The need for a blood transfusion is rare but having consent is a necessary part of your care.     -You should not be taking blood thinners or aspirin at least ten days prior to surgery unless instructed by your surgeon.  -Do not take supplements such as fish oil (omega 3), red yeast rice, turmeric before your surgery.   Day Before Surgery at Home -You will be asked to take in a light diet the day before surgery. You will be advised you can have clear liquids up until 3 hours before your surgery.    Eat a light diet the day before surgery.  Examples including soups, broths, toast, yogurt, mashed potatoes.  AVOID GAS PRODUCING FOODS. Things to avoid include carbonated beverages (fizzy beverages), raw fruits and raw vegetables, or beans.   If your bowels are filled with gas, your surgeon will have difficulty visualizing your pelvic organs which increases your surgical risks.  Your  role in recovery Your role is to become active as soon as directed by your doctor, while still giving yourself time to heal.  Rest when you feel tired. You will be asked to do the following in order to speed your recovery:  - Cough and breathe deeply. This helps to clear and expand your lungs and can prevent pneumonia after surgery.  - STAY ACTIVE WHEN YOU GET HOME. Do mild physical activity. Walking or moving your legs help your circulation and body functions return to normal. Do not try to get up or walk alone the first time after surgery.   -If you develop swelling on one leg or the other, pain in the back of your leg, redness/warmth in one of your legs, please call the office or go to the Emergency Room to have a doppler to rule out a blood clot. For shortness of breath, chest pain-seek care in the Emergency Room as soon as possible. - Actively manage your pain. Managing your pain lets you move in comfort. We will ask you to rate your pain on a scale of zero to 10. It is your responsibility to tell your doctor or nurse where and how much you hurt so your pain can be treated.  Special Considerations -If you are diabetic, you may be placed on insulin after surgery to have closer control over your blood sugars to promote healing and recovery.  This does not mean that you will be discharged on insulin.  If applicable, your oral antidiabetics will be resumed when you are tolerating a solid diet.  -Your final pathology results from  surgery should be available around one week after surgery and the results will be relayed to you when available.  -Dr. Antionette Char is the surgeon that assists your GYN Oncologist with surgery.  If you end up staying the night, the next day after your surgery you will either see Dr. Andrey Farmer, Dr. Pricilla Holm, or Dr. Antionette Char.  -FMLA forms can be faxed to [phone] and please allow 5-7 business days for completion.  Pain Management After Surgery -You have been  prescribed your pain medication and bowel regimen medications before surgery so that you can have these available when you are discharged from the hospital. The pain medication is for use ONLY AFTER surgery and a new prescription will not be given.   -Make sure that you have Tylenol and Ibuprofen at home to use on a regular basis after surgery for pain control. We recommend alternating the medications every hour to six hours since they work differently and are processed in the body differently for pain relief.  -Review the attached handout on narcotic use and their risks and side effects.   Bowel Regimen -You have been prescribed Sennakot-S to take nightly to prevent constipation especially if you are taking the narcotic pain medication intermittently.  It is important to prevent constipation and drink adequate amounts of liquids. You can stop taking this medication when you are not taking pain medication and you are back on your normal bowel routine.  Risks of Surgery Risks of surgery are low but include bleeding, infection, damage to surrounding structures, re-operation, blood clots, and very rarely death.   Blood Transfusion Information (For the consent to be signed before surgery)  We will be checking your blood type before surgery so in case of emergencies, we will know what type of blood you would need.                                            WHAT IS A BLOOD TRANSFUSION?  A transfusion is the replacement of blood or some of its parts. Blood is made up of multiple cells which provide different functions.  Red blood cells carry oxygen and are used for blood loss replacement.  White blood cells fight against infection.  Platelets control bleeding.  Plasma helps clot blood.  Other blood products are available for specialized needs, such as hemophilia or other clotting disorders. BEFORE THE TRANSFUSION  Who gives blood for transfusions?   You may be able to donate blood to be  used at a later date on yourself (autologous donation).  Relatives can be asked to donate blood. This is generally not any safer than if you have received blood from a stranger. The same precautions are taken to ensure safety when a relative's blood is donated.  Healthy volunteers who are fully evaluated to make sure their blood is safe. This is blood bank blood. Transfusion therapy is the safest it has ever been in the practice of medicine. Before blood is taken from a donor, a complete history is taken to make sure that person has no history of diseases nor engages in risky social behavior (examples are intravenous drug use or sexual activity with multiple partners). The donor's travel history is screened to minimize risk of transmitting infections, such as malaria. The donated blood is tested for signs of infectious diseases, such as HIV and hepatitis. The blood is then tested to  be sure it is compatible with you in order to minimize the chance of a transfusion reaction. If you or a relative donates blood, this is often done in anticipation of surgery and is not appropriate for emergency situations. It takes many days to process the donated blood. RISKS AND COMPLICATIONS Although transfusion therapy is very safe and saves many lives, the main dangers of transfusion include:   Getting an infectious disease.  Developing a transfusion reaction. This is an allergic reaction to something in the blood you were given. Every precaution is taken to prevent this. The decision to have a blood transfusion has been considered carefully by your caregiver before blood is given. Blood is not given unless the benefits outweigh the risks.  AFTER SURGERY INSTRUCTIONS  Return to work: 2-4 weeks if applicable  Activity: 1. Be up and out of the bed during the day.  Take a nap if needed.  You may walk up steps but be careful and use the hand rail.  Stair climbing will tire you more than you think, you may need to  stop part way and rest.   2. No lifting or straining for 6 weeks over 10 pounds. No pushing, pulling, straining for 6 weeks.  3. No driving for 1 week(s).  Do not drive if you are taking narcotic pain medicine and make sure that your reaction time has returned.   4. You can shower as soon as the next day after surgery. Shower daily.  Use your regular soap and water (not directly on the incision) and pat your incision(s) dry afterwards; don't rub.  No tub baths or submerging your body in water until cleared by your surgeon. If you have the soap that was given to you by pre-surgical testing that was used before surgery, you do not need to use it afterwards because this can irritate your incisions.   5. No sexual activity and nothing in the vagina for 2-4 weeks.  6. You may experience a small amount of clear drainage from your incisions, which is normal.  If the drainage persists, increases, or changes color please call the office.  7. Do not use creams, lotions, or ointments such as neosporin on your incisions after surgery until advised by your surgeon because they can cause removal of the dermabond glue on your incisions.    8. You may experience vaginal spotting after surgery.  The spotting is normal but if you experience heavy bleeding, call our office.  9. Take Tylenol or ibuprofen first for pain and only use narcotic pain medication for severe pain not relieved by the Tylenol or Ibuprofen.  Monitor your Tylenol intake to a max of 4,000 mg in a 24 hour period. You can alternate these medications after surgery.  Diet: 1. Low sodium Heart Healthy Diet is recommended but you are cleared to resume your normal (before surgery) diet after your procedure.  2. It is safe to use a laxative, such as Miralax or Colace, if you have difficulty moving your bowels. You have been prescribed Sennakot at bedtime every evening to keep bowel movements regular and to prevent constipation.    Wound Care: 1.  Keep clean and dry.  Shower daily.  Reasons to call the Doctor:  Fever - Oral temperature greater than 100.4 degrees Fahrenheit  Foul-smelling vaginal discharge  Difficulty urinating  Nausea and vomiting  Increased pain at the site of the incision that is unrelieved with pain medicine.  Difficulty breathing with or without chest pain  New calf pain especially if only on one side  Sudden, continuing increased vaginal bleeding with or without clots.   Contacts: For questions or concerns you should contact:  Dr. Jeral Pinch at [phone]  Joylene John, NP at [phone]  After Hours: call [phone] and have the GYN Oncologist paged/contacted

## 2020-03-12 NOTE — Patient Instructions (Addendum)
DUE TO COVID-19 ONLY ONE VISITOR IS ALLOWED TO COME WITH YOU AND STAY IN THE WAITING ROOM ONLY DURING  PRE OP AND PROCEDURE.   IF YOU WILL BE ADMITTED INTO THE HOSPITAL YOU ARE ALLOWED ONE SUPPORT PERSON DURING VISITATION HOURS  ONLY (10AM -8PM)   . The support person may change daily. . The support person must pass our screening, gel in and out, and wear a mask at all times, including in the patient's room. . Patients must also wear a mask when staff or their support person are in the room.   COVID SWAB TESTING MUST BE COMPLETED ON:  Monday, 03-16-20 @ 9:20 AM    [address] [place], [place] [address]  (Must self quarantine after testing. Follow instructions on handout.)        Your procedure is scheduled on:  Thursday, 03-19-20   Report to [facility] Main  Entrance   Report to admitting at 12:45 PM   Call this number if you have problems the morning of surgery [phone]   Follow a light diet the day before surgery (avoid gas producing foods)   Do not eat food :After Midnight.   May have liquids until 11:45 AM  day of surgery   CLEAR LIQUID DIET  Foods Allowed                                                                     Foods Excluded  Water, Black Coffee and tea, regular and decaf           liquids that you cannot  Plain Jell-O in any flavor  (No red)                                  see through such as: Fruit ices (not with fruit pulp)                                      milk, soups, orange juice              Iced Popsicles (No red)                                      All solid food                                   Apple juices Sports drinks like Gatorade (No red) Lightly seasoned clear broth or consume(fat free) Sugar, honey syrup     Oral Hygiene is also important to reduce your risk of infection.                                    Remember - BRUSH YOUR TEETH THE MORNING OF SURGERY WITH YOUR REGULAR TOOTHPASTE   Do NOT smoke after  Midnight   Take these medicines the morning of surgery with A SIP  OF WATER:  None                                You may not have any metal on your body including hair pins, jewelry, and body piercings             Do not wear make-up, lotions, powders, perfumes/cologne, or deodorant             Do not wear nail polish.  Do not shave  48 hours prior to surgery.             Do not bring valuables to the hospital. [facility] IS NOT RESPONSIBLE   FOR VALUABLES.   Contacts, dentures or bridgework may not be worn into surgery.   Patients discharged the day of surgery will not be allowed to drive home.                Please read over the following fact sheets you were given: IF YOU HAVE QUESTIONS ABOUT YOUR PRE OP INSTRUCTIONS  PLEASE CALL [phone]   [facility] - Preparing for Surgery Before surgery, you can play an important role.  Because skin is not sterile, your skin needs to be as free of germs as possible.  You can reduce the number of germs on your skin by washing with CHG (chlorahexidine gluconate) soap before surgery.  CHG is an antiseptic cleaner which kills germs and bonds with the skin to continue killing germs even after washing. Please DO NOT use if you have an allergy to CHG or antibacterial soaps.  If your skin becomes reddened/irritated stop using the CHG and inform your nurse when you arrive at Short Stay. Do not shave (including legs and underarms) for at least 48 hours prior to the first CHG shower.  You may shave your face/neck.  Please follow these instructions carefully:  1.  Shower with CHG Soap the night before surgery and the  morning of surgery.  2.  If you choose to wash your hair, wash your hair first as usual with your normal  shampoo.  3.  After you shampoo, rinse your hair and body thoroughly to remove the shampoo.                             4.  Use CHG as you would any other liquid soap.  You can apply chg directly to the skin and wash.  Gently with a  scrungie or clean washcloth.  5.  Apply the CHG Soap to your body ONLY FROM THE NECK DOWN.   Do   not use on face/ open                           Wound or open sores. Avoid contact with eyes, ears mouth and   genitals (private parts).                       Wash face,  Genitals (private parts) with your normal soap.             6.  Wash thoroughly, paying special attention to the area where your    surgery  will be performed.  7.  Thoroughly rinse your body with warm water from the neck down.  8.  DO NOT shower/wash with your normal soap after using  and rinsing off the CHG Soap.                9.  Pat yourself dry with a clean towel.            10.  Wear clean pajamas.            11.  Place clean sheets on your bed the night of your first shower and do not  sleep with pets. Day of Surgery : Do not apply any lotions/deodorants the morning of surgery.  Please wear clean clothes to the hospital/surgery center.  FAILURE TO FOLLOW THESE INSTRUCTIONS MAY RESULT IN THE CANCELLATION OF YOUR SURGERY  PATIENT SIGNATURE_________________________________  NURSE SIGNATURE__________________________________  ________________________________________________________________________  WHAT IS A BLOOD TRANSFUSION? Blood Transfusion Information  A transfusion is the replacement of blood or some of its parts. Blood is made up of multiple cells which provide different functions.  Red blood cells carry oxygen and are used for blood loss replacement.  White blood cells fight against infection.  Platelets control bleeding.  Plasma helps clot blood.  Other blood products are available for specialized needs, such as hemophilia or other clotting disorders. BEFORE THE TRANSFUSION  Who gives blood for transfusions?   Healthy volunteers who are fully evaluated to make sure their blood is safe. This is blood bank blood. Transfusion therapy is the safest it has ever been in the practice of medicine. Before blood  is taken from a donor, a complete history is taken to make sure that person has no history of diseases nor engages in risky social behavior (examples are intravenous drug use or sexual activity with multiple partners). The donor's travel history is screened to minimize risk of transmitting infections, such as malaria. The donated blood is tested for signs of infectious diseases, such as HIV and hepatitis. The blood is then tested to be sure it is compatible with you in order to minimize the chance of a transfusion reaction. If you or a relative donates blood, this is often done in anticipation of surgery and is not appropriate for emergency situations. It takes many days to process the donated blood. RISKS AND COMPLICATIONS Although transfusion therapy is very safe and saves many lives, the main dangers of transfusion include:   Getting an infectious disease.  Developing a transfusion reaction. This is an allergic reaction to something in the blood you were given. Every precaution is taken to prevent this. The decision to have a blood transfusion has been considered carefully by your caregiver before blood is given. Blood is not given unless the benefits outweigh the risks. AFTER THE TRANSFUSION  Right after receiving a blood transfusion, you will usually feel much better and more energetic. This is especially true if your red blood cells have gotten low (anemic). The transfusion raises the level of the red blood cells which carry oxygen, and this usually causes an energy increase.  The nurse administering the transfusion will monitor you carefully for complications. HOME CARE INSTRUCTIONS  No special instructions are needed after a transfusion. You may find your energy is better. Speak with your caregiver about any limitations on activity for underlying diseases you may have. SEEK MEDICAL CARE IF:   Your condition is not improving after your transfusion.  You develop redness or irritation at the  intravenous (IV) site. SEEK IMMEDIATE MEDICAL CARE IF:  Any of the following symptoms occur over the next 12 hours:  Shaking chills.  You have a temperature by mouth above 102 F (38.9  C), not controlled by medicine.  Chest, back, or muscle pain.  People around you feel you are not acting correctly or are confused.  Shortness of breath or difficulty breathing.  Dizziness and fainting.  You get a rash or develop hives.  You have a decrease in urine output.  Your urine turns a dark color or changes to pink, red, or brown. Any of the following symptoms occur over the next 10 days:  You have a temperature by mouth above 102 F (38.9 C), not controlled by medicine.  Shortness of breath.  Weakness after normal activity.  The white part of the eye turns yellow (jaundice).  You have a decrease in the amount of urine or are urinating less often.  Your urine turns a dark color or changes to pink, red, or brown. Document Released: 06/17/2000 Document Revised: 09/12/2011 Document Reviewed: 02/04/2008 [facility] Patient Information 2014 [place], [place].  _______________________________________________________________________

## 2020-03-12 NOTE — Progress Notes (Addendum)
COVID Vaccine Completed: No Date COVID Vaccine completed: COVID vaccine manufacturer: [employer]   PCP - Jonah Blue, MD Cardiologist - Tessa Lerner, DO.  Last OV 01-31-20  Chest x-ray -  EKG - 02-02-20 in Epic Stress Test -  ECHO - 02-07-20 in Epic Cardiac Cath -   Sleep Study -  CPAP -   Fasting Blood Sugar -  Checks Blood Sugar _____ times a day  Blood Thinner Instructions: Aspirin Instructions: Last Dose:  Anesthesia review:   Patient denies shortness of breath, fever, cough and chest pain at PAT appointment   Patient verbalized understanding of instructions that were given to them at the PAT appointment. Patient was also instructed that they will need to review over the PAT instructions again at home before surgery.

## 2020-03-13 ENCOUNTER — Other Ambulatory Visit: Payer: Self-pay

## 2020-03-13 ENCOUNTER — Encounter (HOSPITAL_COMMUNITY): Payer: Self-pay

## 2020-03-13 ENCOUNTER — Encounter (HOSPITAL_COMMUNITY)
Admission: RE | Admit: 2020-03-13 | Discharge: 2020-03-13 | Disposition: A | Discharge status: Home / Self Care | Payer: BC Managed Care – PPO | Source: Ambulatory Visit | Attending: Gynecologic Oncology | Admitting: Gynecologic Oncology

## 2020-03-13 DIAGNOSIS — Z01812 Encounter for preprocedural laboratory examination: Secondary | ICD-10-CM | POA: Insufficient documentation

## 2020-03-13 HISTORY — DX: Essential (primary) hypertension: I10

## 2020-03-13 LAB — COMPREHENSIVE METABOLIC PANEL
ALT: 17 U/L (ref 0–44)
AST: 16 U/L (ref 15–41)
Albumin: 3.9 g/dL (ref 3.5–5.0)
Alkaline Phosphatase: 33 U/L — ABNORMAL LOW (ref 38–126)
Anion gap: 9 (ref 5–15)
BUN: 12 mg/dL (ref 6–20)
CO2: 25 mmol/L (ref 22–32)
Calcium: 8.9 mg/dL (ref 8.9–10.3)
Chloride: 105 mmol/L (ref 98–111)
Creatinine, Ser: 0.73 mg/dL (ref 0.44–1.00)
GFR calc Af Amer: >60 mL/min (ref >60)
GFR calc non Af Amer: >60 mL/min (ref >60)
Glucose, Bld: 71 mg/dL (ref 70–99)
Potassium: 3.6 mmol/L (ref 3.5–5.1)
Sodium: 139 mmol/L (ref 135–145)
Total Bilirubin: 0.5 mg/dL (ref 0.3–1.2)
Total Protein: 7.4 g/dL (ref 6.5–8.1)

## 2020-03-13 LAB — CBC
HCT: 32.2 % — ABNORMAL LOW (ref 36.0–46.0)
Hemoglobin: 10.5 g/dL — ABNORMAL LOW (ref 12.0–15.0)
MCH: 31.6 pg (ref 26.0–34.0)
MCHC: 32.6 g/dL (ref 30.0–36.0)
MCV: 97 fL (ref 80.0–100.0)
Platelets: 339 10*3/uL (ref 150–400)
RBC: 3.32 MIL/uL — ABNORMAL LOW (ref 3.87–5.11)
RDW: 13.7 % (ref 11.5–15.5)
WBC: 5.9 10*3/uL (ref 4.0–10.5)
nRBC: 0 % (ref 0.0–0.2)

## 2020-03-13 LAB — URINALYSIS, ROUTINE W REFLEX MICROSCOPIC
Bilirubin Urine: NEGATIVE
Glucose, UA: NEGATIVE mg/dL
Hgb urine dipstick: NEGATIVE
Ketones, ur: NEGATIVE mg/dL
Leukocytes,Ua: NEGATIVE
Nitrite: NEGATIVE
Protein, ur: NEGATIVE mg/dL
Specific Gravity, Urine: 1.025 (ref 1.005–1.030)
pH: 6 (ref 5.0–8.0)

## 2020-03-13 NOTE — Progress Notes (Signed)
CBC sent to Dr. Tucker to review. 

## 2020-03-16 ENCOUNTER — Other Ambulatory Visit (HOSPITAL_COMMUNITY)
Admission: RE | Admit: 2020-03-16 | Discharge: 2020-03-16 | Disposition: A | Discharge status: Home / Self Care | Payer: BC Managed Care – PPO | Source: Ambulatory Visit | Attending: Gynecologic Oncology | Admitting: Gynecologic Oncology

## 2020-03-16 DIAGNOSIS — Z20822 Contact with and (suspected) exposure to covid-19: Secondary | ICD-10-CM | POA: Diagnosis not present

## 2020-03-16 DIAGNOSIS — Z01812 Encounter for preprocedural laboratory examination: Secondary | ICD-10-CM | POA: Insufficient documentation

## 2020-03-16 LAB — SARS CORONAVIRUS 2 (TAT 6-24 HRS): SARS Coronavirus 2: NEGATIVE

## 2020-03-18 ENCOUNTER — Telehealth: Payer: Self-pay

## 2020-03-18 NOTE — Telephone Encounter (Signed)
Angelica Blankenship states that she understands her written pre op  instruction for tomorrow and does not have any questions or concerns at this time.

## 2020-03-19 ENCOUNTER — Telehealth (HOSPITAL_COMMUNITY): Payer: Self-pay | Admitting: *Deleted

## 2020-03-19 ENCOUNTER — Encounter (HOSPITAL_COMMUNITY)
Admission: RE | Disposition: A | Discharge status: Home / Self Care | Payer: Self-pay | Source: Home / Self Care | Attending: Gynecologic Oncology

## 2020-03-19 ENCOUNTER — Ambulatory Visit (HOSPITAL_COMMUNITY): Payer: BC Managed Care – PPO | Admitting: Anesthesiology

## 2020-03-19 ENCOUNTER — Encounter (HOSPITAL_COMMUNITY): Payer: Self-pay | Admitting: Gynecologic Oncology

## 2020-03-19 ENCOUNTER — Ambulatory Visit (HOSPITAL_COMMUNITY)
Admission: RE | Admit: 2020-03-19 | Discharge: 2020-03-19 | Disposition: A | Discharge status: Home / Self Care | Payer: BC Managed Care – PPO | Attending: Gynecologic Oncology | Admitting: Gynecologic Oncology

## 2020-03-19 ENCOUNTER — Encounter: Payer: Self-pay | Admitting: Gynecologic Oncology

## 2020-03-19 DIAGNOSIS — N9489 Other specified conditions associated with female genital organs and menstrual cycle: Secondary | ICD-10-CM

## 2020-03-19 DIAGNOSIS — N800 Endometriosis of uterus: Secondary | ICD-10-CM | POA: Diagnosis not present

## 2020-03-19 DIAGNOSIS — N803 Endometriosis of pelvic peritoneum: Secondary | ICD-10-CM | POA: Diagnosis present

## 2020-03-19 DIAGNOSIS — Z87891 Personal history of nicotine dependence: Secondary | ICD-10-CM | POA: Diagnosis not present

## 2020-03-19 DIAGNOSIS — N80109 Endometriosis of ovary, unspecified side, unspecified depth: Secondary | ICD-10-CM | POA: Insufficient documentation

## 2020-03-19 DIAGNOSIS — I10 Essential (primary) hypertension: Secondary | ICD-10-CM | POA: Insufficient documentation

## 2020-03-19 DIAGNOSIS — R188 Other ascites: Secondary | ICD-10-CM | POA: Diagnosis not present

## 2020-03-19 HISTORY — PX: LAPAROSCOPY: SHX197

## 2020-03-19 LAB — ABO/RH: ABO/RH(D): A POS

## 2020-03-19 LAB — TYPE AND SCREEN
ABO/RH(D): A POS
Antibody Screen: NEGATIVE

## 2020-03-19 LAB — PREGNANCY, URINE: Preg Test, Ur: NEGATIVE

## 2020-03-19 SURGERY — LAPAROSCOPY, DIAGNOSTIC
Anesthesia: General

## 2020-03-19 MED ORDER — ONDANSETRON HCL 4 MG/2ML IJ SOLN
INTRAMUSCULAR | Status: AC
  Filled 2020-03-19: qty 2

## 2020-03-19 MED ORDER — ONDANSETRON HCL 4 MG/2ML IJ SOLN
INTRAMUSCULAR | Status: DC | PRN
  Administered 2020-03-19: 4 mg via INTRAVENOUS

## 2020-03-19 MED ORDER — FENTANYL CITRATE (PF) 250 MCG/5ML IJ SOLN
INTRAMUSCULAR | Status: DC | PRN
Start: 2020-03-19 — End: 2020-03-19
  Administered 2020-03-19: 100 ug via INTRAVENOUS
  Administered 2020-03-19: 50 ug via INTRAVENOUS
  Administered 2020-03-19: 100 ug via INTRAVENOUS
  Administered 2020-03-19: 50 ug via INTRAVENOUS

## 2020-03-19 MED ORDER — OXYCODONE HCL 5 MG/5ML PO SOLN: 5.0000 mg | Freq: Once | ORAL | Status: DC | PRN

## 2020-03-19 MED ORDER — KETAMINE HCL 10 MG/ML IJ SOLN
INTRAMUSCULAR | Status: AC
  Filled 2020-03-19: qty 1

## 2020-03-19 MED ORDER — MIDAZOLAM HCL 2 MG/2ML IJ SOLN
INTRAMUSCULAR | Status: AC
  Filled 2020-03-19: qty 2

## 2020-03-19 MED ORDER — SCOPOLAMINE 1 MG/3DAYS TD PT72
1.0000 | MEDICATED_PATCH | TRANSDERMAL | Status: DC
  Administered 2020-03-19: 1.5 mg via TRANSDERMAL
  Filled 2020-03-19: qty 1

## 2020-03-19 MED ORDER — CELECOXIB 200 MG PO CAPS
400.0000 mg | ORAL_CAPSULE | ORAL | Status: AC
  Administered 2020-03-19: 400 mg via ORAL
  Filled 2020-03-19: qty 2

## 2020-03-19 MED ORDER — SUGAMMADEX SODIUM 200 MG/2ML IV SOLN
INTRAVENOUS | Status: DC | PRN
  Administered 2020-03-19: 200 mg via INTRAVENOUS

## 2020-03-19 MED ORDER — LACTATED RINGERS IR SOLN
Status: DC | PRN
  Administered 2020-03-19: 1000 mL

## 2020-03-19 MED ORDER — PROMETHAZINE HCL 25 MG/ML IJ SOLN: 6.2500 mg | INTRAMUSCULAR | Status: DC | PRN

## 2020-03-19 MED ORDER — LIDOCAINE 2% (20 MG/ML) 5 ML SYRINGE
INTRAMUSCULAR | Status: DC | PRN
  Administered 2020-03-19: 40 mg via INTRAVENOUS

## 2020-03-19 MED ORDER — LIDOCAINE 2% (20 MG/ML) 5 ML SYRINGE
INTRAMUSCULAR | Status: AC
  Filled 2020-03-19: qty 5

## 2020-03-19 MED ORDER — MIDAZOLAM HCL 2 MG/2ML IJ SOLN
INTRAMUSCULAR | Status: DC | PRN
  Administered 2020-03-19: 2 mg via INTRAVENOUS

## 2020-03-19 MED ORDER — HEPARIN SODIUM (PORCINE) 5000 UNIT/ML IJ SOLN
5000.0000 [IU] | INTRAMUSCULAR | Status: AC
  Administered 2020-03-19: 5000 [IU] via SUBCUTANEOUS
  Filled 2020-03-19: qty 1

## 2020-03-19 MED ORDER — ROCURONIUM BROMIDE 10 MG/ML (PF) SYRINGE
PREFILLED_SYRINGE | INTRAVENOUS | Status: DC | PRN
  Administered 2020-03-19: 80 mg via INTRAVENOUS

## 2020-03-19 MED ORDER — LACTATED RINGERS IV SOLN: INTRAVENOUS | Status: DC

## 2020-03-19 MED ORDER — KETAMINE HCL 10 MG/ML IJ SOLN
INTRAMUSCULAR | Status: DC | PRN
  Administered 2020-03-19 (×3): 10 mg via INTRAVENOUS

## 2020-03-19 MED ORDER — CHLORHEXIDINE GLUCONATE 0.12 % MT SOLN
15.0000 mL | Freq: Once | OROMUCOSAL | Status: AC
  Administered 2020-03-19: 15 mL via OROMUCOSAL

## 2020-03-19 MED ORDER — STERILE WATER FOR IRRIGATION IR SOLN
Status: DC | PRN
  Administered 2020-03-19: 1000 mL

## 2020-03-19 MED ORDER — ROCURONIUM BROMIDE 10 MG/ML (PF) SYRINGE
PREFILLED_SYRINGE | INTRAVENOUS | Status: AC
  Filled 2020-03-19: qty 10

## 2020-03-19 MED ORDER — PROPOFOL 10 MG/ML IV BOLUS
INTRAVENOUS | Status: AC
  Filled 2020-03-19: qty 20

## 2020-03-19 MED ORDER — GABAPENTIN 300 MG PO CAPS
300.0000 mg | ORAL_CAPSULE | ORAL | Status: AC
  Administered 2020-03-19: 300 mg via ORAL
  Filled 2020-03-19: qty 1

## 2020-03-19 MED ORDER — SODIUM CHLORIDE 0.9% FLUSH: 3.0000 mL | Freq: Two times a day (BID) | INTRAVENOUS | Status: DC

## 2020-03-19 MED ORDER — DEXAMETHASONE SODIUM PHOSPHATE 10 MG/ML IJ SOLN
INTRAMUSCULAR | Status: AC
  Filled 2020-03-19: qty 1

## 2020-03-19 MED ORDER — LABETALOL HCL 5 MG/ML IV SOLN
INTRAVENOUS | Status: AC
  Filled 2020-03-19: qty 4

## 2020-03-19 MED ORDER — ORAL CARE MOUTH RINSE: 15.0000 mL | Freq: Once | OROMUCOSAL | Status: AC

## 2020-03-19 MED ORDER — FENTANYL CITRATE (PF) 100 MCG/2ML IJ SOLN
INTRAMUSCULAR | Status: AC
  Filled 2020-03-19: qty 2

## 2020-03-19 MED ORDER — BUPIVACAINE HCL 0.25 % IJ SOLN
INTRAMUSCULAR | Status: AC
  Filled 2020-03-19: qty 1

## 2020-03-19 MED ORDER — LIDOCAINE 20MG/ML (2%) 15 ML SYRINGE OPTIME
INTRAMUSCULAR | Status: DC | PRN
  Administered 2020-03-19: 1.5 mg/kg/h via INTRAVENOUS

## 2020-03-19 MED ORDER — DEXAMETHASONE SODIUM PHOSPHATE 10 MG/ML IJ SOLN
INTRAMUSCULAR | Status: DC | PRN
  Administered 2020-03-19: 4 mg via INTRAVENOUS

## 2020-03-19 MED ORDER — BUPIVACAINE HCL 0.25 % IJ SOLN
INTRAMUSCULAR | Status: DC | PRN
  Administered 2020-03-19: 25 mL

## 2020-03-19 MED ORDER — DEXAMETHASONE SODIUM PHOSPHATE 4 MG/ML IJ SOLN: 4.0000 mg | INTRAMUSCULAR | Status: DC

## 2020-03-19 MED ORDER — ACETAMINOPHEN 500 MG PO TABS
1000.0000 mg | ORAL_TABLET | ORAL | Status: AC
  Administered 2020-03-19: 1000 mg via ORAL
  Filled 2020-03-19: qty 2

## 2020-03-19 MED ORDER — HYDROMORPHONE HCL 1 MG/ML IJ SOLN: 0.2500 mg | INTRAMUSCULAR | Status: DC | PRN

## 2020-03-19 MED ORDER — LABETALOL HCL 5 MG/ML IV SOLN
INTRAVENOUS | Status: DC | PRN
  Administered 2020-03-19: 10 mg via INTRAVENOUS

## 2020-03-19 MED ORDER — OXYCODONE HCL 5 MG PO TABS: 5.0000 mg | ORAL_TABLET | Freq: Once | ORAL | Status: DC | PRN

## 2020-03-19 MED ORDER — PROPOFOL 10 MG/ML IV BOLUS
INTRAVENOUS | Status: DC | PRN
  Administered 2020-03-19: 50 mg via INTRAVENOUS
  Administered 2020-03-19: 150 mg via INTRAVENOUS

## 2020-03-19 SURGICAL SUPPLY — 56 items
AGENT HMST KT MTR STRL THRMB (HEMOSTASIS) ×1
APL ESCP 34 STRL LF DISP (HEMOSTASIS) ×1
APL PRP STRL LF DISP 70% ISPRP (MISCELLANEOUS) ×1
APPLICATOR SURGIFLO ENDO (HEMOSTASIS) ×2 IMPLANT
BACTOSHIELD CHG 4% 4OZ (MISCELLANEOUS) ×1
BAG LAPAROSCOPIC 12 15 PORT 16 (BASKET) IMPLANT
BAG RETRIEVAL 12/15 (BASKET)
BAG SPEC RTRVL LRG 6X4 10 (ENDOMECHANICALS)
CHLORAPREP W/TINT 26 (MISCELLANEOUS) ×2 IMPLANT
COVER BACK TABLE 60X90IN (DRAPES) ×2 IMPLANT
COVER TIP SHEARS 8 DVNC (MISCELLANEOUS) ×1 IMPLANT
COVER TIP SHEARS 8MM DA VINCI (MISCELLANEOUS) ×2
COVER WAND RF STERILE (DRAPES) IMPLANT
DRAPE ARM DVNC X/XI (DISPOSABLE) ×4 IMPLANT
DRAPE COLUMN DVNC XI (DISPOSABLE) ×1 IMPLANT
DRAPE DA VINCI XI ARM (DISPOSABLE) ×8
DRAPE DA VINCI XI COLUMN (DISPOSABLE) ×2
DRAPE SHEET LG 3/4 BI-LAMINATE (DRAPES) ×4 IMPLANT
DRAPE SURG IRRIG POUCH 19X23 (DRAPES) ×2 IMPLANT
DRSG OPSITE POSTOP 4X6 (GAUZE/BANDAGES/DRESSINGS) ×2 IMPLANT
ELECT PENCIL ROCKER SW 15FT (MISCELLANEOUS) ×2 IMPLANT
ELECT REM PT RETURN 15FT ADLT (MISCELLANEOUS) ×2 IMPLANT
GAUZE SPONGE 2X2 8PLY STRL LF (GAUZE/BANDAGES/DRESSINGS) ×1 IMPLANT
GLOVE BIO SURGEON STRL SZ 6 (GLOVE) ×8 IMPLANT
GLOVE BIO SURGEON STRL SZ 6.5 (GLOVE) ×4 IMPLANT
GOWN STRL REUS W/ TWL LRG LVL3 (GOWN DISPOSABLE) ×3 IMPLANT
GOWN STRL REUS W/TWL LRG LVL3 (GOWN DISPOSABLE) ×6
HEMOSTAT SURGICEL 2X3 (HEMOSTASIS) ×2 IMPLANT
HOLDER FOLEY CATH W/STRAP (MISCELLANEOUS) ×2 IMPLANT
IRRIG SUCT STRYKERFLOW 2 WTIP (MISCELLANEOUS) ×2
IRRIGATION SUCT STRKRFLW 2 WTP (MISCELLANEOUS) ×1 IMPLANT
KIT TURNOVER KIT A (KITS) IMPLANT
MANIPULATOR UTERINE 4.5 ZUMI (MISCELLANEOUS) IMPLANT
OBTURATOR OPTICAL STANDARD 8MM (TROCAR) ×2
OBTURATOR OPTICAL STND 8 DVNC (TROCAR) ×1
OBTURATOR OPTICALSTD 8 DVNC (TROCAR) ×1 IMPLANT
PACK ROBOT GYN CUSTOM WL (TRAY / TRAY PROCEDURE) ×2 IMPLANT
PAD POSITIONING PINK XL (MISCELLANEOUS) ×2 IMPLANT
PENCIL SMOKE EVACUATOR (MISCELLANEOUS) IMPLANT
PORT ACCESS TROCAR AIRSEAL 12 (TROCAR) ×1 IMPLANT
PORT ACCESS TROCAR AIRSEAL 5M (TROCAR) ×1
POUCH SPECIMEN RETRIEVAL 10MM (ENDOMECHANICALS) IMPLANT
SCRUB CHG 4% DYNA-HEX 4OZ (MISCELLANEOUS) ×1 IMPLANT
SEAL CANN UNIV 5-8 DVNC XI (MISCELLANEOUS) ×4 IMPLANT
SEAL XI 5MM-8MM UNIVERSAL (MISCELLANEOUS) ×8
SET TRI-LUMEN FLTR TB AIRSEAL (TUBING) IMPLANT
SOLUTION ELECTROLUBE (MISCELLANEOUS) ×2 IMPLANT
SPONGE GAUZE 2X2 STER 10/PKG (GAUZE/BANDAGES/DRESSINGS) ×1
SURGIFLO W/THROMBIN 8M KIT (HEMOSTASIS) ×2 IMPLANT
SUT VIC AB 0 CT1 27 (SUTURE)
SUT VIC AB 0 CT1 27XBRD ANTBC (SUTURE) IMPLANT
TOWEL OR NON WOVEN STRL DISP B (DISPOSABLE) ×2 IMPLANT
TRAP SPECIMEN MUCUS 40CC (MISCELLANEOUS) IMPLANT
TRAY FOLEY MTR SLVR 16FR STAT (SET/KITS/TRAYS/PACK) ×2 IMPLANT
UNDERPAD 30X36 HEAVY ABSORB (UNDERPADS AND DIAPERS) ×2 IMPLANT
WATER STERILE IRR 1000ML POUR (IV SOLUTION) ×2 IMPLANT

## 2020-03-19 NOTE — Anesthesia Postprocedure Evaluation (Signed)
Anesthesia Post Note  Patient: Angelica Blankenship  Procedure(s) Performed: LAPAROSCOPY DIAGNOSTIC WITH ROBOTIC ASSISTANCE AND  BIOPSIES (N/A )     Patient location during evaluation: PACU Anesthesia Type: General Level of consciousness: awake and alert Pain management: pain level controlled Vital Signs Assessment: post-procedure vital signs reviewed and stable Respiratory status: spontaneous breathing, nonlabored ventilation, respiratory function stable and patient connected to nasal cannula oxygen Cardiovascular status: blood pressure returned to baseline and stable Postop Assessment: no apparent nausea or vomiting Anesthetic complications: no   No complications documented.  Last Vitals:  Vitals:   03/19/20 1545 03/19/20 1600  BP: (!) 166/97 (!) 146/98  Pulse: 69 65  Resp: 15 12  Temp: 36.5 C   SpO2: 100% 100%    Last Pain:  Vitals:   03/19/20 1545  TempSrc:   PainSc: 0-No pain                 Mellody Dance

## 2020-03-19 NOTE — Brief Op Note (Signed)
03/19/2020  2:53 PM  PATIENT:  Angelica Blankenship  29 y.o. female  PRE-OPERATIVE DIAGNOSIS:  ADNEXAL MASS, ASCITES  POST-OPERATIVE DIAGNOSIS:  ADNEXAL MASS, ASCITES  PROCEDURE:  Procedure(s): LAPAROSCOPY DIAGNOSTIC WITH ROBOTIC ASSISTANCE AND  BIOPSIES (N/A)  SURGEON:  Surgeon(s) and Role:    Carver Fila, MD - Primary    * Antionette Char, MD - Assisting  ANESTHESIA:   general  EBL:  50 mL   BLOOD ADMINISTERED:none  DRAINS: none   LOCAL MEDICATIONS USED:  MARCAINE     SPECIMEN:  Ascites, peri-umbilical mass biopsy  DISPOSITION OF SPECIMEN:  PATHOLOGY  COUNTS:  YES  TOURNIQUET:  * No tourniquets in log *  DICTATION: .Note written in EPIC  PLAN OF CARE: Discharge to home after PACU  PATIENT DISPOSITION:  PACU - hemodynamically stable.   Delay start of Pharmacological VTE agent (>24hrs) due to surgical blood loss or risk of bleeding: not applicable

## 2020-03-19 NOTE — Interval H&P Note (Signed)
History and Physical Interval Note:  03/19/2020 1:00 PM  Angelica Blankenship  has presented today for surgery, with the diagnosis of ADNEXAL MASS, ASCITES.  The various methods of treatment have been discussed with the patient and family. After consideration of risks, benefits and other options for treatment, the patient has consented to  Procedure(s): LAPAROSCOPY DIAGNOSTIC WITH DRAINAGE WITH ASCITES,POSSIBLE BIOPSY (N/A) POSSIBLE XI ROBOTIC ASSISTED UNILATERAL  OOPHORECTOMY VERSUS UNILATERAL SALPINGO OOPHORECTOMY POSSIBLE EXPLORATORY LAPAROTOMY (N/A) as a surgical intervention.  The patient's history has been reviewed, patient examined, no change in status, stable for surgery.  I have reviewed the patient's chart and labs.  Questions were answered to the patient's satisfaction.     Carver Fila

## 2020-03-19 NOTE — Discharge Instructions (Signed)
03/19/2020  Return to work: 3 days to 1 week  Activity: 1. Be up and out of the bed during the day.  Take a nap if needed.  You may walk up steps but be careful and use the hand rail.  Stair climbing will tire you more than you think, you may need to stop part way and rest.   2. No lifting or straining for 6 weeks.  3. No driving for 1-2 weeks.  Do Not drive if you are taking narcotic pain medicine and can brake safely.  4. Shower daily.  Use soap and water on your incision and pat dry; don't rub.   5. OK to resume sexual activity when you are comfortable  Medications:  - Take ibuprofen and tylenol first line for pain control. Take these regularly (every 6 hours) to decrease the build up of pain.  - If necessary, for severe pain not relieved by ibuprofen, take oxycodone.  - While taking percocet you should take sennakot every night to reduce the likelihood of constipation. If this causes diarrhea, stop its use.  Diet: 1. Low sodium Heart Healthy Diet is recommended.  2. It is safe to use a laxative if you have difficulty moving your bowels.   Wound Care: 1. Keep clean and dry.  Shower daily.  Reasons to call the Doctor:   Fever - Oral temperature greater than 100.4 degrees Fahrenheit  Difficulty urinating  Nausea and vomiting  Increased pain at the site of the incision that is unrelieved with pain medicine.  Difficulty breathing with or without chest pain  New calf pain especially if only on one side   Follow-up: 1. See Eugene Garnet in 3 weeks.  Contacts: For questions or concerns you should contact:  Dr. Eugene Garnet at [phone] After hours and on week-ends call [phone] and ask to speak to the physician on call for Gynecologic Oncology    Diagnostic Laparoscopy, Care After This sheet gives you information about how to care for yourself after your procedure. Your health care provider may also give you more specific instructions. If you have  problems or questions, contact your health care provider. What can I expect after the procedure? After the procedure, it is common to have:  Mild discomfort in the abdomen.  Sore throat. Women who have laparoscopy with pelvic examination may have mild cramping and fluid coming from the vagina for a few days after the procedure. Follow these instructions at home: Medicines  Take over-the-counter and prescription medicines only as told by your health care provider.  If you were prescribed an antibiotic medicine, take it as told by your health care provider. Do not stop taking the antibiotic even if you start to feel better. Driving  Do not drive for 24 hours if you were given a medicine to help you relax (sedative) during your procedure.  Do not drive or use heavy machinery while taking prescription pain medicine. Bathing  Do not take baths, swim, or use a hot tub until your health care provider approves. You may take showers. Incision care   Follow instructions from your health care provider about how to take care of your incisions. Make sure you: ? Wash your hands with soap and water before you change your bandage (dressing). If soap and water are not available, use hand sanitizer. ? Change your dressing as told by your health care provider. ? Leave stitches (sutures), skin glue, or adhesive strips in place. These skin closures may need to  stay in place for 2 weeks or longer. If adhesive strip edges start to loosen and curl up, you may trim the loose edges. Do not remove adhesive strips completely unless your health care provider tells you to do that.  Check your incision areas every day for signs of infection. Check for: ? Redness, swelling, or pain. ? Fluid or blood. ? Warmth. ? Pus or a bad smell. Activity  Return to your normal activities as told by your health care provider. Ask your health care provider what activities are safe for you.  Do not lift anything that is  heavier than 10 lb (4.5 kg), or the limit that you are told, until your health care provider says that it is safe. General instructions  To prevent or treat constipation while you are taking prescription pain medicine, your health care provider may recommend that you: ? Drink enough fluid to keep your urine pale yellow. ? Take over-the-counter or prescription medicines. ? Eat foods that are high in fiber, such as fresh fruits and vegetables, whole grains, and beans. ? Limit foods that are high in fat and processed sugars, such as fried and sweet foods.  Do not use any products that contain nicotine or tobacco, such as cigarettes and e-cigarettes. If you need help quitting, ask your health care provider.  Keep all follow-up visits as told by your health care provider. This is important. Contact a health care provider if:  You develop shoulder pain.  You feel lightheaded or faint.  You are unable to pass gas or have a bowel movement.  You feel nauseous or you vomit.  You develop a rash.  You have redness, swelling, or pain around any incision.  You have fluid or blood coming from any incision.  Any incision feels warm to the touch.  You have pus or a bad smell coming from any incision.  You have a fever or chills. Get help right away if:  You have severe pain.  You have vomiting that does not go away.  You have heavy bleeding from the vagina.  Any incision opens.  You have trouble breathing.  You have chest pain. Summary  After the procedure, it is common to have mild discomfort in the abdomen and a sore throat.  Check your incision areas every day for signs of infection.  Return to your normal activities as told by your health care provider. Ask your health care provider what activities are safe for you. This information is not intended to replace advice given to you by your health care provider. Make sure you discuss any questions you have with your health care  provider. Document Revised: 06/02/2017 Document Reviewed: 12/14/2016 Elsevier Patient Education  2020 [employer].

## 2020-03-19 NOTE — Transfer of Care (Signed)
Immediate Anesthesia Transfer of Care Note  Patient: Angelica Blankenship  Procedure(s) Performed: LAPAROSCOPY DIAGNOSTIC WITH ROBOTIC ASSISTANCE AND  BIOPSIES (N/A )  Patient Location: PACU  Anesthesia Type:General  Level of Consciousness: awake, alert , oriented and patient cooperative  Airway & Oxygen Therapy: Patient Spontanous Breathing and Patient connected to face mask  Post-op Assessment: Report given to RN and Post -op Vital signs reviewed and stable  Post vital signs: Reviewed and stable  Last Vitals:  Vitals Value Taken Time  BP 166/95 03/19/20 1503  Temp    Pulse 84 03/19/20 1505  Resp 17 03/19/20 1505  SpO2 100 % 03/19/20 1505  Vitals shown include unvalidated device data.  Last Pain:  Vitals:   03/19/20 1130  TempSrc:   PainSc: 0-No pain         Complications: No complications documented.

## 2020-03-19 NOTE — Anesthesia Preprocedure Evaluation (Addendum)
Anesthesia Evaluation  Patient identified by MRN, date of birth, ID band Patient awake    Reviewed: Allergy & Precautions, NPO status , Patient's Chart, lab work & pertinent test results  Airway Mallampati: II  TM Distance: >3 FB Neck ROM: Full    Dental no notable dental hx.    Pulmonary Patient abstained from smoking., former smoker,    Pulmonary exam normal breath sounds clear to auscultation       Cardiovascular Exercise Tolerance: Good hypertension, Normal cardiovascular exam Rhythm:Regular Rate:Normal     Neuro/Psych  Headaches, negative psych ROS   GI/Hepatic negative GI ROS, Neg liver ROS,   Endo/Other  negative endocrine ROS  Renal/GU negative Renal ROS  negative genitourinary   Musculoskeletal negative musculoskeletal ROS (+)   Abdominal Normal abdominal exam  (+)   Peds negative pediatric ROS (+)  Hematology  (+) anemia ,   Anesthesia Other Findings Adnexal mass  Reproductive/Obstetrics                            Anesthesia Physical Anesthesia Plan  ASA: II  Anesthesia Plan: General   Post-op Pain Management:    Induction: Intravenous  PONV Risk Score and Plan: 3 and Midazolam, Scopolamine patch - Pre-op, Dexamethasone and Ondansetron  Airway Management Planned: Oral ETT  Additional Equipment: None  Intra-op Plan:   Post-operative Plan:   Informed Consent: I have reviewed the patients History and Physical, chart, labs and discussed the procedure including the risks, benefits and alternatives for the proposed anesthesia with the patient or authorized representative who has indicated his/her understanding and acceptance.     Dental advisory given  Plan Discussed with: CRNA and Anesthesiologist  Anesthesia Plan Comments:        Anesthesia Quick Evaluation

## 2020-03-19 NOTE — Anesthesia Procedure Notes (Signed)
Procedure Name: Intubation Date/Time: 03/19/2020 1:38 PM Performed by: Eben Burow, CRNA Pre-anesthesia Checklist: Patient identified, Emergency Drugs available, Suction available, Patient being monitored and Timeout performed Patient Re-evaluated:Patient Re-evaluated prior to induction Oxygen Delivery Method: Circle system utilized Preoxygenation: Pre-oxygenation with 100% oxygen Induction Type: IV induction Ventilation: Mask ventilation without difficulty Laryngoscope Size: Mac and 4 Grade View: Grade I Tube type: Oral Tube size: 7.0 mm Number of attempts: 1 Airway Equipment and Method: Stylet Placement Confirmation: ETT inserted through vocal cords under direct vision,  positive ETCO2 and breath sounds checked- equal and bilateral Secured at: 21 cm Tube secured with: Tape Dental Injury: Teeth and Oropharynx as per pre-operative assessment

## 2020-03-19 NOTE — Op Note (Signed)
OPERATIVE NOTE  Pre-operative Diagnosis: Complex adnexal mass, biopsy confirmed history of endometriosis, recurrent ascites  Post-operative Diagnosis: Stage IV endometriosis, obliterated cul-de-sac, extensive endometriosis implants including 4 cm infraumbilical and periumbilical endometriosis implant   Operation: Robotic-assisted diagnostic laparoscopy, fulguration of endometriosis implants, biopsy of periumbilical nodule  Surgeon: Eugene Garnet MD  Assistant Surgeon: Antionette Char MD (an MD assistant was necessary for tissue manipulation, management of robotic instrumentation, retraction and positioning due to the complexity of the case and hospital policies).   Anesthesia: GET  Urine Output: 200 cc  Operative Findings: On EUA, minimally mobile uterus, fullness within the cul-de-sac, no nodularity appreciated.  On intra-abdominal entry, there was just under 2 L of bloody ascites.  Powder burn lesions consistent with endometriosis seen on the right diaphragm, liver, anterior cul-de-sac, bilateral pelvic sidewalls.  Posterior cul-de-sac completely obliterated.  Bilateral ovaries appear enlarged but smooth, adherent to the anterior and posterior aspect of the uterus as well as sigmoid colon.  The right fallopian tube is visualized and abnormal in appearance, tethered to the adherent right adnexa.  Significant bleeding noted with biopsy of the infraumbilical mass, which spans approximately 4 cm around the umbilicus.  With biopsy of the mass, there was also drainage of chocolate colored fluid, consistent with a diagnosis of an endometriosis implant.  Estimated Blood Loss:  less than 100 mL      Total IV Fluids: See I&O flowsheet         Specimens: Ascites, periumbilical nodule biopsy         Complications:  None apparent; patient tolerated the procedure well.         Disposition: PACU - hemodynamically stable.  Procedure Details  The patient was seen in the Holding Room. The risks,  benefits, complications, treatment options, and expected outcomes were discussed with the patient.  The patient concurred with the proposed plan, giving informed consent.  The site of surgery properly noted/marked. The patient was identified as Angelica Blankenship and the procedure verified as a diagnostic laparoscopy, removal of ascites, possible ovarian cystectomy versus oophorectomy versus biopsy, treatment of endometriosis implants, and any other indicated procedures.   After induction of anesthesia, the patient was draped and prepped in the usual sterile manner. Patient was placed in supine position after anesthesia and draped and prepped in the usual sterile manner as follows: Her arms were tucked to her side with all appropriate precautions.  The shoulders were stabilized with padded shoulder blocks applied to the acromium processes.  The patient was placed in the semi-lithotomy position in Dunlo stirrups.  The perineum and vagina were prepped with Betadine. The patient was draped after the CholoraPrep had been allowed to dry for 3 minutes.  A Time Out was held and the above information confirmed.  The urethra was prepped with Betadine. Foley catheter was placed.    Next, a 10 mm skin incision was made 1 cm below the subcostal margin in the midclavicular line.  The 5 mm Optiview port and scope was used for direct entry.  Opening pressure was under 10 mm CO2.  The abdomen was insufflated and the findings were noted as above.   Ascites was removed from the abdomen and pelvis in approximately 75 cc sent to cytology.  At this point and all points during the procedure, the patient's intra-abdominal pressure did not exceed 15 mmHg. Next, an 8 mm skin incision was made superior to the umbilicus and a right and left port were placed about 8 cm lateral to  the robot port on the right and left side.  Both lateral ports sites were recycled from the patient's last surgery. All ports were placed under direct  visualization.  The patient was placed in steep Trendelenburg.  The robot was docked in the normal manner.  A number of pictures were taken of the upper abdomen and pelvis.  Powder burn lesion on the anterior cul-de-sac as well as bilateral powder burn lesions along the sidewalls for cauterized with monopolar electrocautery.  Given findings consistent with endometriosis, neither ovary was biopsied or excised.  During manipulation of the right fallopian tube, there was some disruption of the tube with bleeding noted.  A combination of bipolar electrocautery as well as FloSeal was used to achieve hemostasis.  At this point the procedure was completed.  Robotic instruments were removed under direct visulaization.  The robot was undocked.  Attention was then turned to biopsy of the infraumbilical lesion.  An approximately 1.5 cm incision was made just lateral on the right to the umbilicus.  This was performed with a scalpel and carried down into the subcutaneous tissue.  With an Allis on the medial skin edge, the tissue was raised and a scalpel was used to excise a portion of the firm periumbilical nodule.  This was handed off the field to be sent to pathology.  The tissue was irrigated and Bovie was used to achieve hemostasis.  Given continued oozing, Surgicel was placed within the small incision prior to closure.  The subcuticular tissue of all incisions was closed with 4-0 Vicryl and the skin was closed with 4-0 Monocryl in a subcuticular manner.  Dermabond was applied.  Honeycomb dressing and pressure dressings were applied to all the incisions.  Foley catheter was removed.  All sponge, lap and needle counts were correct x  3.   The patient was transferred to the recovery room in stable condition.  Eugene Garnet, MD

## 2020-03-20 ENCOUNTER — Telehealth: Payer: Self-pay

## 2020-03-20 ENCOUNTER — Telehealth: Payer: Self-pay | Admitting: Oncology

## 2020-03-20 ENCOUNTER — Encounter (HOSPITAL_COMMUNITY): Payer: Self-pay | Admitting: Gynecologic Oncology

## 2020-03-20 LAB — SURGICAL PATHOLOGY

## 2020-03-20 LAB — CYTOLOGY - NON PAP

## 2020-03-20 NOTE — Telephone Encounter (Signed)
Dr. Pricilla Holm will call ms Christell Constant once is is finished with the surgery case this morning.

## 2020-03-20 NOTE — Telephone Encounter (Signed)
Angelica Blankenship would like Dr. Pricilla Holm to call to discuss the surgery procedure. She would like to know if the cyst remains or was it removed. She could not tell after reviewing the op note in My Chart.

## 2020-03-20 NOTE — Telephone Encounter (Signed)
Angelica Blankenship states that she is eating, drinking, and urinating well. She has not passed gas. Told her to begin the senokot-s  2 tabs bid with 8 oz water until she has a BM, they adjust according to her BM. Afebrile. Incisions D&I.  Area on right side of abdomen where heparin was given is slightly swollen. No redness or purulent drainage.  Pt using Ibuprofen for pain. Recommended to add extra strength tylenol at 4 hour after ibuprofen and take q 6 hrs. She has not needed the Oxycodone thus far. Angelica Blankenship is aware of her post op appointment as well as the office number [phone] to call if tshe has any questions or concerns.

## 2020-03-20 NOTE — Telephone Encounter (Signed)
Called [facility] at [facility] with referral from Dr. Pricilla Holm.  They need notes, path and imaging faxed to [phone] before patient can be scheduled.  All information faxed to number provided.Marland Kitchen

## 2020-03-22 ENCOUNTER — Ambulatory Visit (HOSPITAL_COMMUNITY)
Admission: EM | Admit: 2020-03-22 | Discharge: 2020-03-22 | Disposition: A | Discharge status: Home / Self Care | Payer: BC Managed Care – PPO | Attending: Family Medicine | Admitting: Family Medicine

## 2020-03-22 ENCOUNTER — Encounter (HOSPITAL_COMMUNITY): Payer: Self-pay | Admitting: Emergency Medicine

## 2020-03-22 ENCOUNTER — Other Ambulatory Visit: Payer: Self-pay

## 2020-03-22 DIAGNOSIS — L089 Local infection of the skin and subcutaneous tissue, unspecified: Secondary | ICD-10-CM

## 2020-03-22 DIAGNOSIS — T148XXA Other injury of unspecified body region, initial encounter: Secondary | ICD-10-CM | POA: Diagnosis not present

## 2020-03-22 MED ORDER — CEFDINIR 300 MG PO CAPS: 600.0000 mg | ORAL_CAPSULE | Freq: Every day | ORAL | 0 refills | Status: DC

## 2020-03-22 NOTE — Discharge Instructions (Addendum)
Complete all antibiotics that are prescribed as prescribed.  Continue to vigilantly watch the site if you notice any increased swelling or redness or increased tenderness please follow-up immediately with your surgeon or go immediately to the emergency department for blood work and a possible CT scan.  Otherwise today I am treating you for superficial postsurgical infection.  No worrisome signs observed on exam today.

## 2020-03-22 NOTE — ED Provider Notes (Signed)
[facility]    CSN: [id number] Arrival date & time: 03/22/20  1115      History   Chief Complaint Chief Complaint  Patient presents with  . Wound Infection    HPI Angelica Blankenship is a 29 y.o. female.   HPI Patient presents today for evaluation of wound drainage from a postsurgical incision from a procedure in which she had an adnexal mass remove on 03/17/2020.  She had been unable to follow-up with her surgeon given the weekend therefore she presents today for evaluation.  She is concerned as is persistently drainage discolored drainage and is only occurring at a specific site.  Is draining continuously from her umbilicus.  She denies any severe pain.  No fever, chills, nausea or vomiting.  She is scheduled for postprocedural follow-up on 04/15/2020. Past Medical History:  Diagnosis Date  . Ascites   . Endometriosis   . Headache   . Heart murmur    asymptomatic  . Hypertension    No medications prescribed    Patient Active Problem List   Diagnosis Date Noted  . Endometriosis 03/11/2020  . Adnexal mass 03/11/2020  . Other ascites 03/11/2020  . Complete miscarriage 09/12/2014    Past Surgical History:  Procedure Laterality Date  . IR PARACENTESIS  01/01/2020  . LAPAROSCOPY N/A 06/18/2015   Procedure: LAPAROSCOPY DIAGNOSTIC;  Surgeon: Maxie Better, MD;  Location: [facility];  Service: Gynecology;  Laterality: N/A;  90 min. requested  . LAPAROSCOPY N/A 03/19/2020   Procedure: LAPAROSCOPY DIAGNOSTIC WITH ROBOTIC ASSISTANCE AND  BIOPSIES;  Surgeon: Carver Fila, MD;  Location: [facility];  Service: Gynecology;  Laterality: N/A;  . ROBOTIC ASSISTED DIAGNOSTIC LAPAROSCOPY N/A 06/18/2015   Procedure:  ROBOTIC ASSISTED DIAGNOSTIC LAPAROSCOPY WITH EXCISION OF PELVIC ENDOMETRIOSIS and lysis of adhesions, chromopertubation;  Surgeon: Maxie Better, MD;  Location: [facility];  Service: Gynecology;  Laterality: N/A;    OB History    Gravida  2   Para      Term       Preterm      AB  2   Living        SAB  1   TAB  1   Ectopic      Multiple  0   Live Births               Home Medications    Prior to Admission medications   Medication Sig Start Date End Date Taking? Authorizing Provider  acetaminophen (TYLENOL) 500 MG tablet Take 1,000 mg by mouth every 6 (six) hours as needed for moderate pain.    Yes [provider]  ibuprofen (ADVIL) 800 MG tablet Take 1 tablet (800 mg total) by mouth every 8 (eight) hours as needed for moderate pain. For AFTER surgery only 03/11/20  Yes Cross, Laurene Footman, NP  aspirin-acetaminophen-caffeine (EXCEDRIN MIGRAINE) [phone] MG tablet Take 1 tablet by mouth every 6 (six) hours as needed for headache.    [provider]  oxyCODONE (OXY IR/ROXICODONE) 5 MG immediate release tablet Take 1 tablet (5 mg total) by mouth every 4 (four) hours as needed for severe pain. For AFTER surgery only, do not take and drive 0/8/14   Cross, Efraim Kaufmann D, NP  senna-docusate (SENOKOT-S) 8.6-50 MG tablet Take 2 tablets by mouth at bedtime. For AFTER surgery, do not take if having diarrhea 03/11/20   Warner Mccreedy D, NP    Family History Family History  Problem Relation Age of Onset  . Hypertension  Mother   . Hypertension Sister   . Breast cancer Maternal Aunt   . Hypertension Maternal Grandmother   . Cervical cancer Maternal Grandmother   . Ovarian cancer Neg Hx   . Uterine cancer Neg Hx   . Colon cancer Neg Hx     Social History Social History   Tobacco Use  . Smoking status: Former Smoker    Packs/day: 1.00    Types: Cigars    Quit date: 12/02/2016    Years since quitting: 3.3  . Smokeless tobacco: Never Used  Vaping Use  . Vaping Use: Never used  Substance Use Topics  . Alcohol use: Yes    Comment: occasional  . Drug use: Yes    Frequency: 35.0 times per week    Types: Marijuana    Comment: uses daily     Allergies   Patient has no known allergies.   Review of Systems Review of  Systems Pertinent negatives listed in HPI Physical Exam Triage Vital Signs ED Triage Vitals  Enc Vitals Group     BP 03/22/20 1243 135/86     Pulse Rate 03/22/20 1243 62     Resp 03/22/20 1243 16     Temp 03/22/20 1243 98.1 F (36.7 C)     Temp Source 03/22/20 1243 Oral     SpO2 03/22/20 1243 100 %     Weight --      Height --      Head Circumference --      Peak Flow --      Pain Score 03/22/20 1242 0     Pain Loc --      Pain Edu? --      Excl. in GC? --    No data found.  Updated Vital Signs BP 135/86 (BP Location: Right Arm)   Pulse 62   Temp 98.1 F (36.7 C) (Oral)   Resp 16   LMP 03/05/2020 (Exact Date)   SpO2 100%   Visual Acuity Right Eye Distance:   Left Eye Distance:   Bilateral Distance:    Right Eye Near:   Left Eye Near:    Bilateral Near:     Physical Exam General appearance: alert, well developed, well nourished, cooperative and in no distress Head: Normocephalic, without obvious abnormality, atraumatic Respiratory: Respirations even and unlabored, normal respiratory rate Heart: rate and rhythm normal. No gallop or murmurs noted on exam  Abdomen: BS +, distention present, umbilicus laparoscopic incision with scant discolored drainage present , tenderness at the umbilicus with deep palpation , no rebound tenderness, or no mass Extremities: No gross deformities Skin: Skin color, texture, turgor normal. No rashes seen  Psych: Appropriate mood and affect.  UC Treatments / Results  Labs (all labs ordered are listed, but only abnormal results are displayed) Labs Reviewed - No data to display  EKG   Radiology No results found.  Procedures Procedures (including critical care time)  Medications Ordered in UC Medications - No data to display  Initial Impression / Assessment and Plan / UC Course  I have reviewed the triage vital signs and the nursing notes.  Pertinent labs & imaging results that were available during my care of the patient  were reviewed by me and considered in my medical decision making (see chart for details).     Treating for appearance of a suspected early superficial skin infection involving the laparoscopic site of the umbilicus.  We will treat with Omnicef x10 days.  Advised patient to follow-up  with surgeon to advise of concerns.  Red flags discussed in detail that warrant emergent evaluation in the setting of the ER.  Patient is stable on exam today. Final Clinical Impressions(s) / UC Diagnoses   Final diagnoses:  Wound , Surgical infection     Discharge Instructions     Complete all antibiotics that are prescribed as prescribed.  Continue to vigilantly watch the site if you notice any increased swelling or redness or increased tenderness please follow-up immediately with your surgeon or go immediately to the emergency department for blood work and a possible CT scan.  Otherwise today I am treating you for superficial postsurgical infection.  No worrisome signs observed on exam today.    ED Prescriptions    Medication Sig Dispense Auth. Provider   cefdinir (OMNICEF) 300 MG capsule Take 2 capsules (600 mg total) by mouth daily. 20 capsule Bing Neighbors, FNP     PDMP not reviewed this encounter.   Bing Neighbors, FNP 03/22/20 [phone]

## 2020-03-22 NOTE — ED Triage Notes (Signed)
Pt presents with drainage from wound. States had surgery on 9/16. States has 4 incision sites and one is draining.

## 2020-03-23 ENCOUNTER — Encounter: Payer: Self-pay | Admitting: Gynecologic Oncology

## 2020-03-23 NOTE — Telephone Encounter (Signed)
Ms Ruffner is afebrile. The incision is not draining without the dressing on it. Told her that Warner Mccreedy, NP said that the incision does not look infected. She can stop the ATB.  She took 2 doses yesterday. She is to apply some 4x4 gauze to the area and apply medical tape in an X fashion to apply pressure to the incision. If she notices any drainage, she is to call the office to see when she can come in to have area evaluated for derma bond reapplication vs stitching. Pt verbalized understanding.

## 2020-03-24 ENCOUNTER — Telehealth: Payer: Self-pay | Admitting: Oncology

## 2020-03-24 NOTE — Telephone Encounter (Signed)
Called [facility] at [facility] [phone]) regarding urgent referral.  They said the referral is being triaged and the patient will be scheduled once the referral is reviewed.

## 2020-03-25 ENCOUNTER — Telehealth: Payer: Self-pay

## 2020-03-25 ENCOUNTER — Telehealth: Payer: Self-pay | Admitting: *Deleted

## 2020-03-25 NOTE — Telephone Encounter (Signed)
TC to patient to follow up on leaking abdominal incision.  Patient stated leaking from incision above umbilicus is less than yesterday but would prefer appointment to be checked.  Per Warner Mccreedy, NP, patient scheduled for 03/26/20 @ 1030.  Patient made aware of appointment time, date and visitor restrictions.

## 2020-03-25 NOTE — Telephone Encounter (Signed)
Patient called and stated that her returned to work date was wrong on her FMLA forms. Dated fixed and forms faxed to [employer] company/employer

## 2020-03-26 ENCOUNTER — Telehealth: Payer: Self-pay | Admitting: Oncology

## 2020-03-26 ENCOUNTER — Inpatient Hospital Stay (HOSPITAL_BASED_OUTPATIENT_CLINIC_OR_DEPARTMENT_OTHER): Payer: BC Managed Care – PPO | Admitting: Gynecologic Oncology

## 2020-03-26 ENCOUNTER — Encounter: Payer: Self-pay | Admitting: Gynecologic Oncology

## 2020-03-26 ENCOUNTER — Other Ambulatory Visit: Payer: Self-pay

## 2020-03-26 VITALS — BP 117/62 | HR 67 | Temp 97.8°F | Resp 18 | Ht 66.0 in | Wt 137.0 lb

## 2020-03-26 DIAGNOSIS — R239 Unspecified skin changes: Secondary | ICD-10-CM | POA: Insufficient documentation

## 2020-03-26 DIAGNOSIS — N809 Endometriosis, unspecified: Secondary | ICD-10-CM

## 2020-03-26 NOTE — Progress Notes (Signed)
GYN ONC Symptom Management  Angelica Blankenship is a 29 year old female s/p robotic-assisted diagnostic laparoscopy, fulguration of endometriosis implants, biopsy of periumbilical nodule on 03/19/2020 with Dr. Eugene Garnet. Operative findings included stage IV endometriosis, obliterated cul-de-sac, extensive endometriosis implants including 4 cm infraumbilical and periumbilical endometriosis implant.  She presents today for evaluation of abdominal incision drainage. She went to the [facility] on 03/22/2020 for evaluation of this drainage. She was started on Omnicef for 10 days for suspected early superficial skin infection. Photos were sent to our office by the patient and the patient was advised to monitor the drainage since it had decreased.  She was advised at that time to stop taking the antibiotic since the incision was not infected.  She was contacted by our office on 03/25/2020 and an appt was made for incision assessment today.  She states she started noting a small amount of blood coming from the umbilical incision this am. No fever or chills reported.  Tolerating diet with no nausea or emesis reported.  Bowels and bladder functioning without issues. No other concerns voiced about her other abdominal incisions.  Minimal abd discomfort reported.  Assessment: Alert, oriented x x3, in no acute distress.  Lungs clear.  Heart regular in rate and rhythm. Abdomen soft. Incision at the umbilicus with small pinpoint area of dark blood draining minimally.  Incision dried and additional dermabond applied. No signs of infection. No erythema.  Reportable signs and symptoms reviewed. Our office will contact [facility] about moving her appt in December up to a sooner date.  Dr. Pricilla Holm recommends the patient seeing her GYN to get medication started for suppression of her endometriosis while she waits for an appt at [facility]. She has a follow up scheduled with Dr. Pricilla Holm in October but advised her that this appt could be  moved to a sooner date if needed.  Dressing supplies given if needed.  All questions answered. Advised to call the office for any needs and the after hours contact information given as well.  Advised to call for any new or persistent drainage from the incision after reapplication of dermabond today.

## 2020-03-26 NOTE — Patient Instructions (Signed)
We applied additional surgical glue called dermabond to the incision with the mild drainage.  This should stop the drainage.  Wait to take a shower starting tomorrow. If you notice the drainage or bleeding returns, please call the office at [phone]. For any emergency needs after hours, you can call the after hours number at [phone].   We will reach out to [facility] about getting your appointment moved up to a sooner date. You should hear from [place] or their office with this information.

## 2020-03-26 NOTE — Telephone Encounter (Signed)
Called Dr. Elpidio Eric office to schedule urgent appointment.  They said they need the patient to call because she needs to take care of a few things.  Called [place] and she will call Dr. Elpidio Eric office to schedule an appointment as soon as possible.

## 2020-03-26 NOTE — Telephone Encounter (Signed)
Called [facility] in [place] to try to move up December appointment.  They said they do not have any appointments until December.

## 2020-03-27 ENCOUNTER — Telehealth: Payer: Self-pay | Admitting: Oncology

## 2020-03-27 NOTE — Telephone Encounter (Signed)
Left a message to check on Angelica Blankenship's incision.  Requested a return call.

## 2020-03-28 ENCOUNTER — Encounter (HOSPITAL_COMMUNITY): Payer: Self-pay | Admitting: *Deleted

## 2020-03-28 ENCOUNTER — Emergency Department (HOSPITAL_COMMUNITY)
Admission: EM | Admit: 2020-03-28 | Discharge: 2020-03-28 | Disposition: A | Discharge status: Home / Self Care | Payer: BC Managed Care – PPO | Attending: Emergency Medicine | Admitting: Emergency Medicine

## 2020-03-28 ENCOUNTER — Other Ambulatory Visit: Payer: Self-pay

## 2020-03-28 DIAGNOSIS — Z87891 Personal history of nicotine dependence: Secondary | ICD-10-CM | POA: Insufficient documentation

## 2020-03-28 DIAGNOSIS — Z4800 Encounter for change or removal of nonsurgical wound dressing: Secondary | ICD-10-CM | POA: Diagnosis present

## 2020-03-28 DIAGNOSIS — Z5189 Encounter for other specified aftercare: Secondary | ICD-10-CM

## 2020-03-28 DIAGNOSIS — I10 Essential (primary) hypertension: Secondary | ICD-10-CM | POA: Insufficient documentation

## 2020-03-28 DIAGNOSIS — Z7982 Long term (current) use of aspirin: Secondary | ICD-10-CM | POA: Diagnosis not present

## 2020-03-28 NOTE — Discharge Instructions (Signed)
I recommend applying pressure and gauze to the area to help with the bleeding. I recommend holding pressure for 5 minutes to help the area form a clot. Please secure a good dressing with tape to keep the area intact. I would follow general wound care as directed by your surgeon.  I want you to follow-up with your surgeon Dr. Pricilla Holm for further evaluation management on Monday.  Want to come back to emergency department if you develop uncontrolled bleeding, fevers, chills, see purulent discharge or redness around her surgical wound. Chest pain, shortness of breath, severe abdominal pain, nausea, vomiting, diarrhea.

## 2020-03-28 NOTE — ED Triage Notes (Signed)
Pt states she had surgery 9/16 and has bleeding from left lower incision. Started this morning

## 2020-03-28 NOTE — ED Provider Notes (Signed)
[facility]-EMERGENCY DEPT Provider Note   CSN: [id number] Arrival date & time: 03/28/20  1144     History Chief Complaint  Patient presents with  . Post-op Problem    Angelica Blankenship is a 29 y.o. female.  HPI   Patient with significant medical history of endometriosis, heart murmur, hypertension, presents to the emergency department with chief complaint of bleeding from surgical site x2 days. Patient had an adnexal mass as well as endometriosis removed on 09/16 by Dr. Pricilla Holm. She had no complication from the surgery but yesterday she noticed she had some bleeding from her surgical site. She denies recent trauma to the area, redness, swelling, abdominal pain, nausea, vomiting, diarrhea, pelvic pain, vaginal discharge or vaginal bleeding. Patient states she is tried placing dressings over the area but she has bled through 4 of them  which prompted her to come to the emergency department. Patient states she has a schedule appointment see her surgeon on Monday but wanted to come in here to be evaluated. Patient denies any alleviating aggravating factors. Patient denies headache, fever, chills, shortness of breath, chest pain, vomiting, nausea, vomiting, diarrhea.  Past Medical History:  Diagnosis Date  . Ascites   . Endometriosis   . Headache   . Heart murmur    asymptomatic  . Hypertension    No medications prescribed    Patient Active Problem List   Diagnosis Date Noted  . Alteration in skin integrity related to surgical incision 03/26/2020  . Endometriosis 03/11/2020  . Adnexal mass 03/11/2020  . Other ascites 03/11/2020  . Complete miscarriage 09/12/2014    Past Surgical History:  Procedure Laterality Date  . IR PARACENTESIS  01/01/2020  . LAPAROSCOPY N/A 06/18/2015   Procedure: LAPAROSCOPY DIAGNOSTIC;  Surgeon: Maxie Better, MD;  Location: [facility];  Service: Gynecology;  Laterality: N/A;  90 min. requested  . LAPAROSCOPY N/A 03/19/2020    Procedure: LAPAROSCOPY DIAGNOSTIC WITH ROBOTIC ASSISTANCE AND  BIOPSIES;  Surgeon: Carver Fila, MD;  Location: [facility];  Service: Gynecology;  Laterality: N/A;  . ROBOTIC ASSISTED DIAGNOSTIC LAPAROSCOPY N/A 06/18/2015   Procedure:  ROBOTIC ASSISTED DIAGNOSTIC LAPAROSCOPY WITH EXCISION OF PELVIC ENDOMETRIOSIS and lysis of adhesions, chromopertubation;  Surgeon: Maxie Better, MD;  Location: [facility];  Service: Gynecology;  Laterality: N/A;     OB History    Gravida  2   Para      Term      Preterm      AB  2   Living        SAB  1   TAB  1   Ectopic      Multiple  0   Live Births              Family History  Problem Relation Age of Onset  . Hypertension Mother   . Hypertension Sister   . Breast cancer Maternal Aunt   . Hypertension Maternal Grandmother   . Cervical cancer Maternal Grandmother   . Ovarian cancer Neg Hx   . Uterine cancer Neg Hx   . Colon cancer Neg Hx     Social History   Tobacco Use  . Smoking status: Former Smoker    Packs/day: 1.00    Types: Cigars    Quit date: 12/02/2016    Years since quitting: 3.3  . Smokeless tobacco: Never Used  Vaping Use  . Vaping Use: Never used  Substance Use Topics  . Alcohol use: Yes    Comment: occasional  .  Drug use: Yes    Frequency: 35.0 times per week    Types: Marijuana    Comment: uses daily    Home Medications Prior to Admission medications   Medication Sig Start Date End Date Taking? Authorizing Provider  acetaminophen (TYLENOL) 500 MG tablet Take 1,000 mg by mouth every 6 (six) hours as needed for moderate pain.     [provider]  aspirin-acetaminophen-caffeine (EXCEDRIN MIGRAINE) [phone] MG tablet Take 1 tablet by mouth every 6 (six) hours as needed for headache.    [provider]  ibuprofen (ADVIL) 800 MG tablet Take 1 tablet (800 mg total) by mouth every 8 (eight) hours as needed for moderate pain. For AFTER surgery only 03/11/20   Warner Mccreedy D, NP   oxyCODONE (OXY IR/ROXICODONE) 5 MG immediate release tablet Take 1 tablet (5 mg total) by mouth every 4 (four) hours as needed for severe pain. For AFTER surgery only, do not take and drive 11/[birth year]/64   Cross, Efraim Kaufmann D, NP  senna-docusate (SENOKOT-S) 8.6-50 MG tablet Take 2 tablets by mouth at bedtime. For AFTER surgery, do not take if having diarrhea 03/11/20   Warner Mccreedy D, NP    Allergies    Patient has no known allergies.  Review of Systems   Review of Systems  Constitutional: Negative for chills and fever.  HENT: Negative for congestion, sore throat, tinnitus and trouble swallowing.   Eyes: Negative for visual disturbance.  Respiratory: Negative for cough and shortness of breath.   Cardiovascular: Negative for chest pain.  Gastrointestinal: Negative for abdominal pain, diarrhea, nausea and vomiting.  Genitourinary: Negative for dysuria, enuresis and flank pain.  Musculoskeletal: Negative for back pain.       Bleeding from surgical site  Skin: Negative for rash.  Neurological: Negative for dizziness and headaches.  Hematological: Does not bruise/bleed easily.    Physical Exam Updated Vital Signs BP (!) 162/92 (BP Location: Right Arm)   Pulse 70   Temp 98.5 F (36.9 C) (Oral)   Resp 18   Ht 5\' 6"  (1.676 m)   Wt 62.1 kg   LMP 03/05/2020 (Exact Date)   SpO2 100%   BMI 22.11 kg/m   Physical Exam Vitals and nursing note reviewed.  Constitutional:      General: She is not in acute distress.    Appearance: She is not ill-appearing.  HENT:     Head: Normocephalic and atraumatic.     Nose: No congestion.     Mouth/Throat:     Mouth: Mucous membranes are moist.     Pharynx: Oropharynx is clear.  Eyes:     General: No scleral icterus. Cardiovascular:     Rate and Rhythm: Normal rate and regular rhythm.     Pulses: Normal pulses.     Heart sounds: No murmur heard.  No friction rub. No gallop.   Pulmonary:     Effort: No respiratory distress.     Breath sounds: No  wheezing, rhonchi or rales.  Abdominal:     General: There is no distension.     Palpations: Abdomen is soft.     Tenderness: There is no abdominal tenderness. There is no right CVA tenderness, left CVA tenderness or guarding.     Comments: Patient's abdomen is visualized there were 4 small surgical scars from her laparoscopic surgery.  There were no signs of overlying infection, no erythema, swelling, or purulent discharge noted.  The left bottom surgical site had minimal serosanguineous fluid draining from the wound.  Abdomen was nondistended, normoactive bowel sounds, dull to percussion, nontender to palpation.  No acute abdomen noted on exam.  Musculoskeletal:        General: No swelling.  Skin:    General: Skin is warm and dry.     Findings: No rash.  Neurological:     Mental Status: She is alert.  Psychiatric:        Mood and Affect: Mood normal.     ED Results / Procedures / Treatments   Labs (all labs ordered are listed, but only abnormal results are displayed) Labs Reviewed - No data to display  EKG None  Radiology No results found.  Procedures Procedures (including critical care time)  Medications Ordered in ED Medications - No data to display  ED Course  I have reviewed the triage vital signs and the nursing notes.  Pertinent labs & imaging results that were available during my care of the patient were reviewed by me and considered in my medical decision making (see chart for details).    MDM Rules/Calculators/A&P                          I have personally reviewed all imaging, labs and have interpreted them.  Patient presents with bleeding from her surgical site.  She was alert, did not appear in acute distress, vital signs reassuring.  I have low suspicion patient suffering from overlying cellulitis or deep tissue infection as there is no signs of infection noted on abdominal exam, abdomen was soft nontender to palpation, no purulent discharge noted, vital  signs reassuring.  Low suspicion intra-abdominal abnormality requiring surgical intervention as patient denies abdominal pain, nausea, vomiting, diarrhea, tolerating p.o., denies dysuria, difficulty with bowel movements, no acute abdomen on exam.  Low suspicion for systemic infection as patient is nontoxic-appearing, vital signs reassuring, no obvious source infection on exam.  I suspect patient has a seroma from her recent surgery. she is having minimal serosanguineous drainage from surgical site.  Will apply dressing to the area and have her follow-up with her surgeon for further evaluation.  Vital signs have remained stable, no indication for hospital admission.  Patient discussed with attending who agrees assessment and plan.  Patient  given at home care with strict return precautions.  Patient verbalized she understood and agreed to plan. Final Clinical Impression(s) / ED Diagnoses Final diagnoses:  Visit for wound check    Rx / DC Orders ED Discharge Orders    None       Carroll Sage, PA-C 03/28/20 1347    Geoffery Lyons, MD 03/28/20 1425

## 2020-03-30 ENCOUNTER — Inpatient Hospital Stay (HOSPITAL_BASED_OUTPATIENT_CLINIC_OR_DEPARTMENT_OTHER): Payer: BC Managed Care – PPO

## 2020-03-30 ENCOUNTER — Other Ambulatory Visit: Payer: Self-pay

## 2020-03-30 ENCOUNTER — Telehealth: Payer: Self-pay

## 2020-03-30 VITALS — BP 120/72 | HR 90 | Temp 97.9°F | Resp 18 | Wt 137.0 lb

## 2020-03-30 DIAGNOSIS — N9489 Other specified conditions associated with female genital organs and menstrual cycle: Secondary | ICD-10-CM

## 2020-03-30 DIAGNOSIS — N809 Endometriosis, unspecified: Secondary | ICD-10-CM

## 2020-03-30 NOTE — Telephone Encounter (Signed)
TC to patient following up on ED/UC visits this weekend.  Patient stated it was the left lower incision that was leaking.  Patient stated left lower incision drainage has resolved.  Patient reports now she has minimal leakage from incision near umbilicus.  Patient reports some bleeding from "belly button" and stated she started her menstrual cycle and feels this is the cause of the drainage from her umbilicus.  Patient will come in today at 1130am for incision check.

## 2020-03-30 NOTE — Progress Notes (Signed)
Patient here for a post operative incision check.  Patient was seen at outside urgent care and ED this weekend for c/o drainage from left lower incision.  Patient denied uncontrolled pain, no N/V, no difficulty with bowel or bladder.  Patient stated left lower incision stopped draining around 3pm yesterday and reports onset of bleeding from naval.  Left lower incision clean, dry, without drainage and with dermabond intact.  Incision above umbilicus dry with intact dermabond and there is a scant amount of dark red blood noted coming from umbilicus. Patient reports "this happens every time I get my cycle".  Patient reassured regarding lower left incision.  Patient reports she has appointment with [facility] on 04/08/20.  All necessary records and pictures provided for appointment.  Patient given her copy of FMLA forms.  Patient will keep post op appointment with Dr. Pricilla Holm on 04/15/20.  Patient will call with any questions or concerns.  Dr. Dorothyann Gibbs, NP made aware of all findings.

## 2020-04-06 ENCOUNTER — Encounter: Payer: Self-pay | Admitting: Gynecologic Oncology

## 2020-04-06 ENCOUNTER — Telehealth: Payer: Self-pay

## 2020-04-06 DIAGNOSIS — N9489 Other specified conditions associated with female genital organs and menstrual cycle: Secondary | ICD-10-CM

## 2020-04-06 MED ORDER — IBUPROFEN 800 MG PO TABS: 800.0000 mg | ORAL_TABLET | Freq: Three times a day (TID) | ORAL | 0 refills | Status: DC | PRN

## 2020-04-06 NOTE — Telephone Encounter (Signed)
Told Angelica Blankenship that Angelica Blankenship said that the incision looked fine.  Monitor the drainage and call if drainage increases,the area becomes reddened and sore to touch, and or if she has a temp of 100.4 or higher.  Ibuprofen refill sent in to [facility]  Pt verbalized understanding.

## 2020-04-06 NOTE — Telephone Encounter (Signed)
LM for Ms Tuel to send a picture of incision to Dr. Pricilla Holm as she previously did so Warner Mccreedy, NP can look at incision.

## 2020-04-06 NOTE — Telephone Encounter (Signed)
Ms Safran states that she noticed a small amount of white drainage from incision above umbilicus. She states that it is not draining.  She has to press on the incision to have the white material expressed. Afebrile. Umbiilical area not sore to the touch. Reviewed with Warner Mccreedy, NP

## 2020-04-15 ENCOUNTER — Inpatient Hospital Stay: Payer: BC Managed Care – PPO | Attending: Gynecologic Oncology | Admitting: Gynecologic Oncology

## 2020-04-15 ENCOUNTER — Other Ambulatory Visit: Payer: Self-pay

## 2020-04-15 ENCOUNTER — Encounter: Payer: Self-pay | Admitting: Gynecologic Oncology

## 2020-04-15 VITALS — BP 141/85 | HR 66 | Temp 98.0°F | Resp 18 | Ht 66.0 in | Wt 141.0 lb

## 2020-04-15 DIAGNOSIS — R188 Other ascites: Secondary | ICD-10-CM | POA: Insufficient documentation

## 2020-04-15 DIAGNOSIS — R1909 Other intra-abdominal and pelvic swelling, mass and lump: Secondary | ICD-10-CM | POA: Insufficient documentation

## 2020-04-15 DIAGNOSIS — N809 Endometriosis, unspecified: Secondary | ICD-10-CM

## 2020-04-15 NOTE — Progress Notes (Signed)
Gynecologic Oncology Return Clinic Visit  04/16/2020  Reason for Visit: Postoperative follow-up  Interval History: Patient was seen today for follow-up after surgery in mid September for complex adnexal mass in the setting of recurrent ascites and periumbilical lesion with a known history of endometriosis.  Surgery included drainage of ascites, exploratory laparoscopy, and biopsy of periumbilical nodule.  Pathology was consistent with endometriosis.  Since surgery, the patient has had some issues with the periumbilical incision.  This incision was used for biopsy of the 3-4 cm mass only, not for intra-abdominal entry.  She continues to have intermittent drainage which she describes as bleeding from this site although less frequently now.  She has some pruritus related to her incisions.  She is tolerating a regular diet without nausea or emesis.  She reports normal bowel and bladder function.  She continues to have intermittent abdominal and pelvic pain that is unchanged.  Angelica Blankenship had a visit with a [facility] specialist and chronic pelvic pain and endometriosis.  During this visit, Angelica Blankenship for hormonal suppression was recommended.  Given the extent and severity of her disease, patient also had a long conversation regarding likely need for surgical management in the future and the reality of any future fertility.  She was quite upset by this conversation but is understanding of the discussion.  She has been referred to [facility] fertility and has an appointment in December.  She is also scheduled to see an OB/GYN here in [employer] sometime next week.   Past Medical/Surgical History: Past Medical History:  Diagnosis Date  . Ascites   . Endometriosis   . Headache   . Heart murmur    asymptomatic  . Hypertension    No medications prescribed    Past Surgical History:  Procedure Laterality Date  . IR PARACENTESIS  01/01/2020  . LAPAROSCOPY N/A 06/18/2015   Procedure: LAPAROSCOPY DIAGNOSTIC;  Surgeon:  Maxie Better, MD;  Location: [facility];  Service: Gynecology;  Laterality: N/A;  90 min. requested  . LAPAROSCOPY N/A 03/19/2020   Procedure: LAPAROSCOPY DIAGNOSTIC WITH ROBOTIC ASSISTANCE AND  BIOPSIES;  Surgeon: Carver Fila, MD;  Location: [facility];  Service: Gynecology;  Laterality: N/A;  . ROBOTIC ASSISTED DIAGNOSTIC LAPAROSCOPY N/A 06/18/2015   Procedure:  ROBOTIC ASSISTED DIAGNOSTIC LAPAROSCOPY WITH EXCISION OF PELVIC ENDOMETRIOSIS and lysis of adhesions, chromopertubation;  Surgeon: Maxie Better, MD;  Location: [facility];  Service: Gynecology;  Laterality: N/A;    Family History  Problem Relation Age of Onset  . Hypertension Mother   . Hypertension Sister   . Breast cancer Maternal Aunt   . Hypertension Maternal Grandmother   . Cervical cancer Maternal Grandmother   . Ovarian cancer Neg Hx   . Uterine cancer Neg Hx   . Colon cancer Neg Hx     Social History   Socioeconomic History  . Marital status: Single    Spouse name: Not on file  . Number of children: 0  . Years of education: Not on file  . Highest education level: Not on file  Occupational History  . Not on file  Tobacco Use  . Smoking status: Former Smoker    Packs/day: 1.00    Types: Cigars    Quit date: 12/02/2016    Years since quitting: 3.3  . Smokeless tobacco: Never Used  Vaping Use  . Vaping Use: Never used  Substance and Sexual Activity  . Alcohol use: Yes    Comment: occasional  . Drug use: Yes    Frequency: 35.0  times per week    Types: Marijuana    Comment: uses daily  . Sexual activity: Yes    Birth control/protection: None  Other Topics Concern  . Not on file  Social History Narrative  . Not on file   Social Determinants of Health   Financial Resource Strain:   . Difficulty of Paying Living Expenses: Not on file  Food Insecurity:   . Worried About Programme researcher, broadcasting/film/video in the Last Year: Not on file  . Ran Out of Food in the Last Year: Not on file  Transportation Needs:   .  Lack of Transportation (Medical): Not on file  . Lack of Transportation (Non-Medical): Not on file  Physical Activity:   . Days of Exercise per Week: Not on file  . Minutes of Exercise per Session: Not on file  Stress:   . Feeling of Stress : Not on file  Social Connections:   . Frequency of Communication with Friends and Family: Not on file  . Frequency of Social Gatherings with Friends and Family: Not on file  . Attends Religious Services: Not on file  . Active Member of Clubs or Organizations: Not on file  . Attends Banker Meetings: Not on file  . Marital Status: Not on file    Current Medications:  Current Outpatient Medications:  .  acetaminophen (TYLENOL) 500 MG tablet, Take 1,000 mg by mouth every 6 (six) hours as needed for moderate pain. , Disp: , Rfl:  .  aspirin-acetaminophen-caffeine (EXCEDRIN MIGRAINE) 250-250-65 MG tablet, Take 1 tablet by mouth every 6 (six) hours as needed for headache., Disp: , Rfl:  .  ibuprofen (ADVIL) 800 MG tablet, Take 1 tablet (800 mg total) by mouth every 8 (eight) hours as needed for moderate pain. For AFTER surgery only, Disp: 30 tablet, Rfl: 0 .  senna-docusate (SENOKOT-S) 8.6-50 MG tablet, Take 2 tablets by mouth at bedtime. For AFTER surgery, do not take if having diarrhea, Disp: 30 tablet, Rfl: 0  Review of Systems: Pertinent positives as per HPI. Denies appetite changes, fevers, chills, fatigue, unexplained weight changes. Denies hearing loss, neck lumps or masses, mouth sores, ringing in ears or voice changes. Denies cough or wheezing.  Denies shortness of breath. Denies chest pain or palpitations. Denies leg swelling. Denies abdominal distention, blood in stools, constipation, diarrhea, nausea, vomiting, or early satiety. Denies pain with intercourse, dysuria, frequency, hematuria or incontinence. Denies hot flashes or vaginal discharge.   Denies joint pain, back pain or muscle pain/cramps. Denies rash, or  wounds. Denies dizziness, headaches, numbness or seizures. Denies swollen lymph nodes or glands, denies easy bruising or bleeding. Denies anxiety, depression, confusion, or decreased concentration.  Physical Exam: BP (!) 141/85 (BP Location: Left Arm, Patient Position: Sitting)   Pulse 66 Comment: RA  Temp 98 F (36.7 C) (Tympanic)   Resp 18   Ht 5\' 6"  (1.676 m)   Wt 141 lb (64 kg)   SpO2 100%   BMI 22.76 kg/m  General: Alert, oriented, no acute distress. HEENT: Normocephalic, atraumatic, sclera anicteric. Chest: Unlabored breathing on room air. Abdomen: soft, mildly tender around umbilical incision.  Normoactive bowel sounds.  No masses or hepatosplenomegaly appreciated.  Well-healing laparoscopic sites, remaining Dermabond removed.  Periumbilical incision where biopsy taken with minimal amount of drainage and skin separation just big enough to allow the wooden end of a small Q-tip to be placed.  Q-tip and used to probe with no tracking noted past approximately 1 cm.  No  erythema or warmth around this incision. Extremities: Grossly normal range of motion.  Warm, well perfused.  No edema bilaterally.  Laboratory & Radiologic Studies: None new  Assessment & Plan: Angelica Blankenship is a 29 y.o. woman with Stage IV endometriosis status post diagnostic laparoscopy with drainage of ascites as well as periumbilical nodule biopsy confirming endometriosis lesion presenting for postoperative follow-up and treatment discussion.  The patient has healed well from surgery.  The one incision that has had some drainage and prolonged healing is the periumbilical incision that was made to take a biopsy of the periumbilical mass suspected to be an endometriosis implant.  There is a small amount of drainage today but no sign of infection.  We discussed signs and symptoms that would be concerning for developing infection and the patient knows to call the clinic if she has any of these.  Otherwise we talked  about continued care of the incision.  The patient is still processing the conversation she had with the specialist at [facility] regarding her advanced endometriosis and implications of this has for her fertility.  The recommendation was to start some hormonal suppression as soon as possible.  She is scheduled to see an OB/GYN next week but there will likely be some delay in terms of getting her started on Angelica Blankenship.  I offered that our office could put in the request since it has already been determined that her insurance will authorize Angelica Blankenship injection.  We will have her come back either at the end of this week or early next week for a 9-month Angelica Blankenship injection.  24 minutes of total time was spent for this patient encounter, including preparation, face-to-face counseling with the patient and coordination of care, and documentation of the encounter.  Eugene Garnet, MD  Division of Gynecologic Oncology  Department of Obstetrics and Gynecology  [facility]

## 2020-04-15 NOTE — Patient Instructions (Signed)
Overall, you are healing well.  Keep a close eye on the incision near your bellybutton.  There is a small opening that should heal shut over the next couple of weeks.  If you develop increased pain at that site, redness, swelling, or discharge that looks like pus, please call the clinic at [phone] and we will get you started on some antibiotics.  There is no sign of infection on exam today.  We will see you back for your Lupron injection either at the end of this week or early next week.

## 2020-04-18 ENCOUNTER — Encounter: Payer: Self-pay | Admitting: Gynecologic Oncology

## 2020-04-20 ENCOUNTER — Other Ambulatory Visit: Payer: Self-pay

## 2020-04-20 ENCOUNTER — Inpatient Hospital Stay: Payer: BC Managed Care – PPO

## 2020-04-20 DIAGNOSIS — N809 Endometriosis, unspecified: Secondary | ICD-10-CM | POA: Diagnosis not present

## 2020-04-20 DIAGNOSIS — R188 Other ascites: Secondary | ICD-10-CM | POA: Diagnosis present

## 2020-04-20 DIAGNOSIS — R1909 Other intra-abdominal and pelvic swelling, mass and lump: Secondary | ICD-10-CM | POA: Diagnosis present

## 2020-04-20 MED ORDER — LEUPROLIDE ACETATE (3 MONTH) 11.25 MG IM KIT
11.2500 mg | PACK | Freq: Once | INTRAMUSCULAR | Status: AC
  Administered 2020-04-20: 11.25 mg via INTRAMUSCULAR
  Filled 2020-04-20: qty 11.25

## 2020-04-20 NOTE — Patient Instructions (Signed)
Leuprolide injection What is this medicine? LEUPROLIDE (loo PROE lide) is a man-made hormone. It is used to treat the symptoms of prostate cancer. This medicine may also be used to treat children with early onset of puberty. It may be used for other hormonal conditions. This medicine may be used for other purposes; ask your health care provider or pharmacist if you have questions. COMMON BRAND NAME(S): Lupron What should I tell my health care provider before I take this medicine? They need to know if you have any of these conditions:  diabetes  heart disease or previous heart attack  high blood pressure  high cholesterol  pain or difficulty passing urine  spinal cord metastasis  stroke  tobacco smoker  an unusual or allergic reaction to leuprolide, benzyl alcohol, other medicines, foods, dyes, or preservatives  pregnant or trying to get pregnant  breast-feeding How should I use this medicine? This medicine is for injection under the skin or into a muscle. You will be taught how to prepare and give this medicine. Use exactly as directed. Take your medicine at regular intervals. Do not take your medicine more often than directed. It is important that you put your used needles and syringes in a special sharps container. Do not put them in a trash can. If you do not have a sharps container, call your pharmacist or healthcare provider to get one. A special MedGuide will be given to you by the pharmacist with each prescription and refill. Be sure to read this information carefully each time. Talk to your pediatrician regarding the use of this medicine in children. While this medicine may be prescribed for children as young as 8 years for selected conditions, precautions do apply. Overdosage: If you think you have taken too much of this medicine contact a poison control center or emergency room at once. NOTE: This medicine is only for you. Do not share this medicine with others. What if  I miss a dose? If you miss a dose, take it as soon as you can. If it is almost time for your next dose, take only that dose. Do not take double or extra doses. What may interact with this medicine? Do not take this medicine with any of the following medications:  chasteberry This medicine may also interact with the following medications:  herbal or dietary supplements, like black cohosh or DHEA  female hormones, like estrogens or progestins and birth control pills, patches, rings, or injections  female hormones, like testosterone This list may not describe all possible interactions. Give your health care provider a list of all the medicines, herbs, non-prescription drugs, or dietary supplements you use. Also tell them if you smoke, drink alcohol, or use illegal drugs. Some items may interact with your medicine. What should I watch for while using this medicine? Visit your doctor or health care professional for regular checks on your progress. During the first week, your symptoms may get worse, but then will improve as you continue your treatment. You may get hot flashes, increased bone pain, increased difficulty passing urine, or an aggravation of nerve symptoms. Discuss these effects with your doctor or health care professional, some of them may improve with continued use of this medicine. Female patients may experience a menstrual cycle or spotting during the first 2 months of therapy with this medicine. If this continues, contact your doctor or health care professional. This medicine may increase blood sugar. Ask your healthcare provider if changes in diet or medicines are needed if   you have diabetes. What side effects may I notice from receiving this medicine? Side effects that you should report to your doctor or health care professional as soon as possible:  allergic reactions like skin rash, itching or hives, swelling of the face, lips, or tongue  breathing problems  chest  pain  depression or memory disorders  pain in your legs or groin  pain at site where injected  severe headache  signs and symptoms of high blood sugar such as being more thirsty or hungry or having to urinate more than normal. You may also feel very tired or have blurry vision  swelling of the feet and legs  visual changes  vomiting Side effects that usually do not require medical attention (report to your doctor or health care professional if they continue or are bothersome):  breast swelling or tenderness  decrease in sex drive or performance  diarrhea  hot flashes  loss of appetite  muscle, joint, or bone pains  nausea  redness or irritation at site where injected  skin problems or acne This list may not describe all possible side effects. Call your doctor for medical advice about side effects. You may report side effects to FDA at 1-800-FDA-1088. Where should I keep my medicine? Keep out of the reach of children. Store below 25 degrees C (77 degrees F). Do not freeze. Protect from light. Do not use if it is not clear or if there are particles present. Throw away any unused medicine after the expiration date. NOTE: This sheet is a summary. It may not cover all possible information. If you have questions about this medicine, talk to your doctor, pharmacist, or health care provider.  2020 Elsevier/Gold Standard (2018-04-19 09:52:48)  

## 2020-04-20 NOTE — Telephone Encounter (Signed)
LM for Angelica Blankenship stating that [name] reviewed the picture of the incision, though picture was a little fuzzy. [name]  stated that it did not obviously look infected. Requested that she call the office back to see how the incision did over the weekend.

## 2020-04-27 DIAGNOSIS — D649 Anemia, unspecified: Secondary | ICD-10-CM | POA: Insufficient documentation

## 2020-04-27 DIAGNOSIS — I10 Essential (primary) hypertension: Secondary | ICD-10-CM | POA: Diagnosis present

## 2020-05-04 ENCOUNTER — Telehealth: Payer: Self-pay | Admitting: *Deleted

## 2020-05-04 NOTE — Telephone Encounter (Signed)
Fax last office note to [facility] as requested

## 2020-06-19 ENCOUNTER — Other Ambulatory Visit: Payer: Self-pay | Admitting: Gynecologic Oncology

## 2020-06-19 ENCOUNTER — Encounter: Payer: Self-pay | Admitting: Gynecologic Oncology

## 2020-06-19 DIAGNOSIS — N9489 Other specified conditions associated with female genital organs and menstrual cycle: Secondary | ICD-10-CM

## 2020-06-19 DIAGNOSIS — N809 Endometriosis, unspecified: Secondary | ICD-10-CM

## 2020-06-23 ENCOUNTER — Telehealth: Payer: Self-pay

## 2020-06-23 NOTE — Telephone Encounter (Signed)
LM for Ms Glade to call the office to schedule her Lupron injection for 07-21-19.

## 2020-06-30 ENCOUNTER — Telehealth: Payer: Self-pay

## 2020-06-30 NOTE — Telephone Encounter (Signed)
Patient returned call regarding Lupron injection for 07/20/2020.  Called patient back to make her aware of lab appointment (UPT) prior to injection.  Left message to return call.

## 2020-07-20 ENCOUNTER — Telehealth: Payer: Self-pay | Admitting: *Deleted

## 2020-07-20 ENCOUNTER — Inpatient Hospital Stay: Payer: BC Managed Care – PPO

## 2020-07-20 NOTE — Telephone Encounter (Signed)
Patient called and moved her appts from today to tomorrow

## 2020-07-21 ENCOUNTER — Other Ambulatory Visit: Payer: Self-pay

## 2020-07-21 ENCOUNTER — Inpatient Hospital Stay: Payer: BC Managed Care – PPO

## 2020-07-21 ENCOUNTER — Inpatient Hospital Stay: Payer: BC Managed Care – PPO | Attending: Gynecologic Oncology

## 2020-07-21 VITALS — BP 130/79 | HR 64 | Resp 18

## 2020-07-21 DIAGNOSIS — N9489 Other specified conditions associated with female genital organs and menstrual cycle: Secondary | ICD-10-CM

## 2020-07-21 DIAGNOSIS — N809 Endometriosis, unspecified: Secondary | ICD-10-CM | POA: Diagnosis present

## 2020-07-21 LAB — PREGNANCY, URINE: Preg Test, Ur: NEGATIVE

## 2020-07-21 MED ORDER — LEUPROLIDE ACETATE (3 MONTH) 22.5 MG IM KIT
11.2500 mg | PACK | Freq: Once | INTRAMUSCULAR | Status: AC
  Administered 2020-07-21: 11.25 mg via INTRAMUSCULAR
  Filled 2020-07-21: qty 22.5

## 2020-07-21 NOTE — Patient Instructions (Signed)
Leuprolide injection What is this medicine? LEUPROLIDE (loo PROE lide) is a man-made hormone. It is used to treat the symptoms of prostate cancer. This medicine may also be used to treat children with early onset of puberty. It may be used for other hormonal conditions. This medicine may be used for other purposes; ask your health care provider or pharmacist if you have questions. COMMON BRAND NAME(S): Lupron What should I tell my health care provider before I take this medicine? They need to know if you have any of these conditions:  diabetes  heart disease or previous heart attack  high blood pressure  high cholesterol  pain or difficulty passing urine  spinal cord metastasis  stroke  tobacco smoker  an unusual or allergic reaction to leuprolide, benzyl alcohol, other medicines, foods, dyes, or preservatives  pregnant or trying to get pregnant  breast-feeding How should I use this medicine? This medicine is for injection under the skin or into a muscle. You will be taught how to prepare and give this medicine. Use exactly as directed. Take your medicine at regular intervals. Do not take your medicine more often than directed. It is important that you put your used needles and syringes in a special sharps container. Do not put them in a trash can. If you do not have a sharps container, call your pharmacist or healthcare provider to get one. A special MedGuide will be given to you by the pharmacist with each prescription and refill. Be sure to read this information carefully each time. Talk to your pediatrician regarding the use of this medicine in children. While this medicine may be prescribed for children as young as 8 years for selected conditions, precautions do apply. Overdosage: If you think you have taken too much of this medicine contact a poison control center or emergency room at once. NOTE: This medicine is only for you. Do not share this medicine with others. What if  I miss a dose? If you miss a dose, take it as soon as you can. If it is almost time for your next dose, take only that dose. Do not take double or extra doses. What may interact with this medicine? Do not take this medicine with any of the following medications:  chasteberry  cisapride  dronedarone  pimozide  thioridazine This medicine may also interact with the following medications:  herbal or dietary supplements, like black cohosh or DHEA  female hormones, like estrogens or progestins and birth control pills, patches, rings, or injections  female hormones, like testosterone  other medicines that prolong the QT interval (abnormal heart rhythm) This list may not describe all possible interactions. Give your health care provider a list of all the medicines, herbs, non-prescription drugs, or dietary supplements you use. Also tell them if you smoke, drink alcohol, or use illegal drugs. Some items may interact with your medicine. What should I watch for while using this medicine? Visit your doctor or health care professional for regular checks on your progress. During the first week, your symptoms may get worse, but then will improve as you continue your treatment. You may get hot flashes, increased bone pain, increased difficulty passing urine, or an aggravation of nerve symptoms. Discuss these effects with your doctor or health care professional, some of them may improve with continued use of this medicine. Female patients may experience a menstrual cycle or spotting during the first 2 months of therapy with this medicine. If this continues, contact your doctor or health care professional.   This medicine may increase blood sugar. Ask your healthcare provider if changes in diet or medicines are needed if you have diabetes. What side effects may I notice from receiving this medicine? Side effects that you should report to your doctor or health care professional as soon as possible:  allergic  reactions like skin rash, itching or hives, swelling of the face, lips, or tongue  breathing problems  chest pain  depression or memory disorders  pain in your legs or groin  pain at site where injected  severe headache  signs and symptoms of high blood sugar such as being more thirsty or hungry or having to urinate more than normal. You may also feel very tired or have blurry vision  swelling of the feet and legs  visual changes  vomiting Side effects that usually do not require medical attention (report to your doctor or health care professional if they continue or are bothersome):  breast swelling or tenderness  decrease in sex drive or performance  diarrhea  hot flashes  loss of appetite  muscle, joint, or bone pains  nausea  redness or irritation at site where injected  skin problems or acne This list may not describe all possible side effects. Call your doctor for medical advice about side effects. You may report side effects to FDA at 1-800-FDA-1088. Where should I keep my medicine? Keep out of the reach of children. Store below 25 degrees C (77 degrees F). Do not freeze. Protect from light. Do not use if it is not clear or if there are particles present. Throw away any unused medicine after the expiration date. NOTE: This sheet is a summary. It may not cover all possible information. If you have questions about this medicine, talk to your doctor, pharmacist, or health care provider.  2021 Elsevier/Gold Standard (2019-05-22 10:57:41)  

## 2020-08-11 ENCOUNTER — Telehealth: Payer: Self-pay

## 2020-08-11 NOTE — Telephone Encounter (Signed)
Told Angelica Blankenship that Dr. Pricilla Holm is not recommending having the fluid drawn off her abdomen as she is asymptomatic. There is always a risk for infection with placing a needle in the abdomen. If she develops symptoms such as abdominal pain or only  able to eat a small amount of food and feels full,she needs to call Henreitta Leber PA at her [facility] office to be seen.  They can get another [place] and determine if the abdominal fluid has increased and if drawing off the fluid is needed.  They can order the fluid to be drawn off the abdomen at [facility]. Pt verbalized understanding.

## 2020-08-11 NOTE — Telephone Encounter (Signed)
Angelica Blankenship called to have an appointment made for her to have the fluid drained from her abdomen. Pt saw Henreitta Leber PA. At [facility] today for [place] F/U of right cyst. Pt asymptomatic. Obtained US report and office note from office for Dr. Pricilla Holm to review and see if paracentesis is indicated and call patient back.  It may be later today or in the next couple of days as Dr. Pricilla Holm is in clinic and the OR. Pt verbalized understanding.

## 2020-09-01 ENCOUNTER — Telehealth: Payer: Self-pay

## 2020-09-01 NOTE — Telephone Encounter (Signed)
Told Angelica Blankenship that she is seeing the Endometriosis Specialist at [facility], Concha Norway, MD. She needs to discuss the abdominal pain with her and see if they would fill out intermittent FMLA from work to to incapacitating pain.  Angelica Blankenship states that she has an appointment today with Dasouki Abu-Alnadi, Concha Norway, MD and can discuss this then.

## 2020-09-15 ENCOUNTER — Other Ambulatory Visit: Payer: Self-pay | Admitting: Obstetrics and Gynecology

## 2020-09-15 DIAGNOSIS — R911 Solitary pulmonary nodule: Secondary | ICD-10-CM

## 2020-09-28 ENCOUNTER — Ambulatory Visit
Admission: RE | Admit: 2020-09-28 | Discharge: 2020-09-28 | Disposition: A | Discharge status: Home / Self Care | Payer: BC Managed Care – PPO | Source: Ambulatory Visit | Attending: Obstetrics and Gynecology | Admitting: Obstetrics and Gynecology

## 2020-09-28 ENCOUNTER — Other Ambulatory Visit: Payer: Self-pay

## 2020-09-28 DIAGNOSIS — R911 Solitary pulmonary nodule: Secondary | ICD-10-CM

## 2020-09-28 IMAGING — CT CT CHEST W/O CM
1 series · 15 of 34 positions shown, 19 images · non-contrast
Comparison: Abdomen/pelvis CT [DATE].

CLINICAL DATA: Left lower lobe nodule.

EXAM:
CT CHEST WITHOUT CONTRAST
TECHNIQUE: Multidetector CT imaging of the chest was performed following the
standard protocol without IV contrast.

[Series 2: chest w/(date) · axial · 0.66mm/px · z∈[-301,-37]mm · 15 of 156 slices shown, 19 images]
[im 12/156  mediastinal]
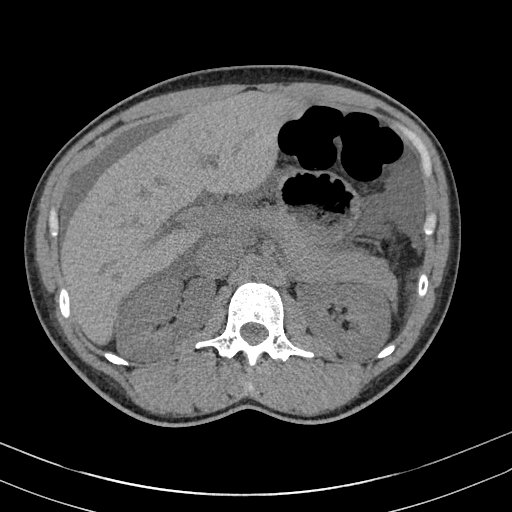
[im 12/156  lung]
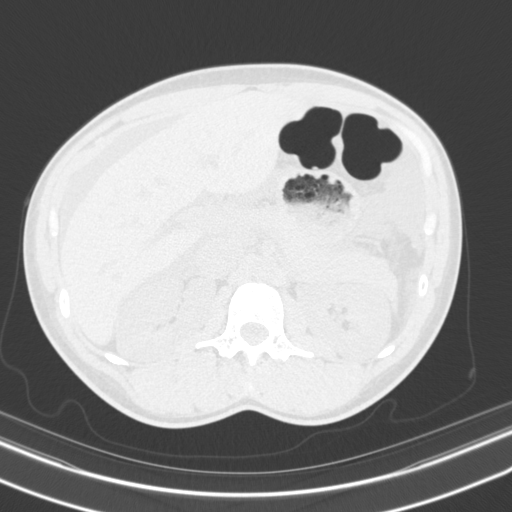
[im 23/156  lung]
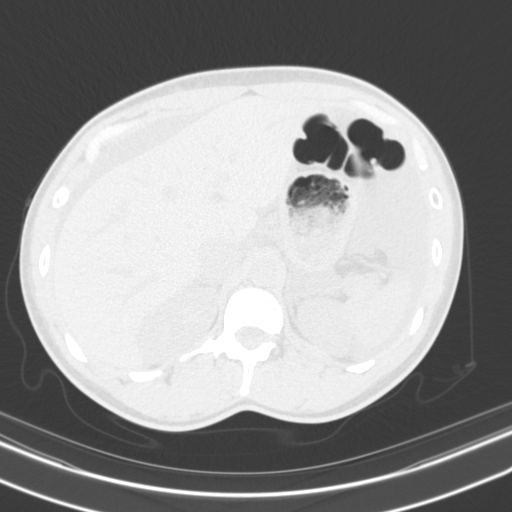
[im 32/156  lung]
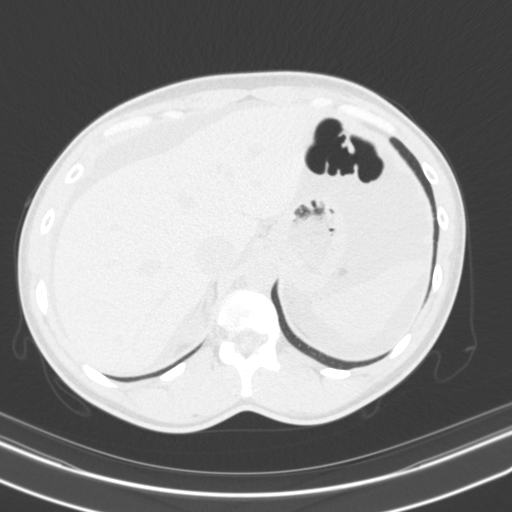
[im 41/156  lung]
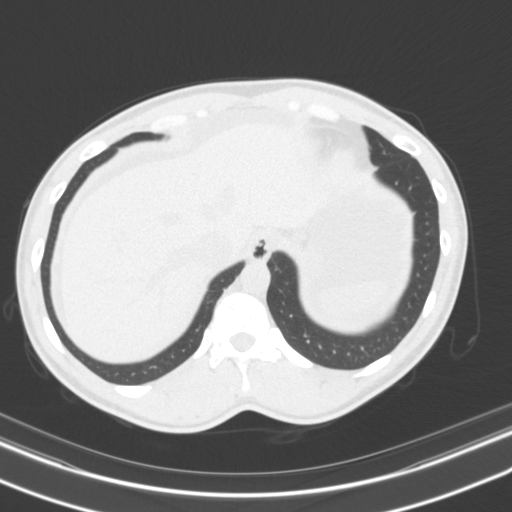
[im 52/156  mediastinal]
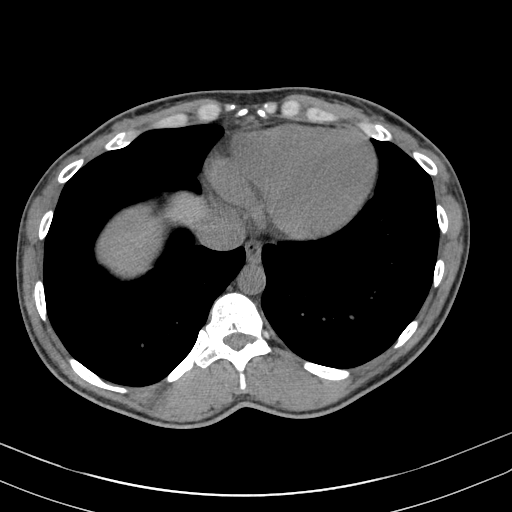
[im 52/156  lung]
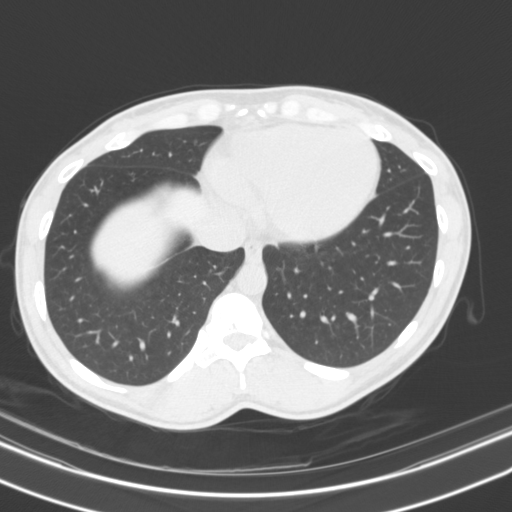
[im 63/156  lung]
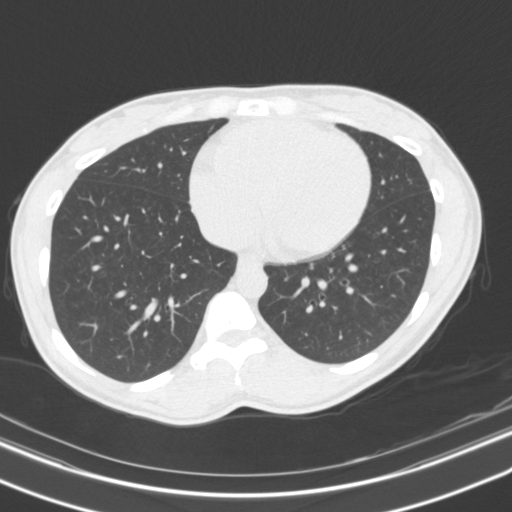
[im 69/156  lung]
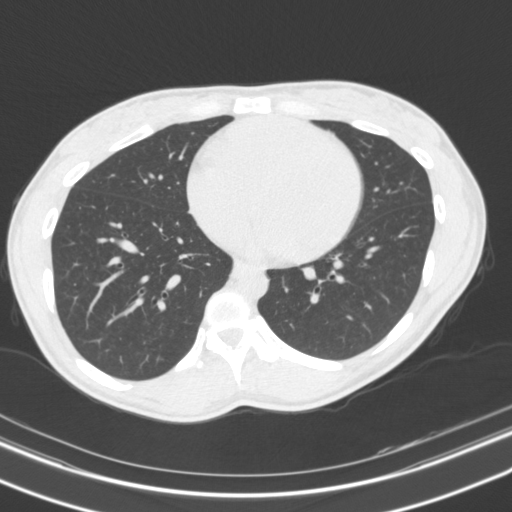
[im 81/156  lung]
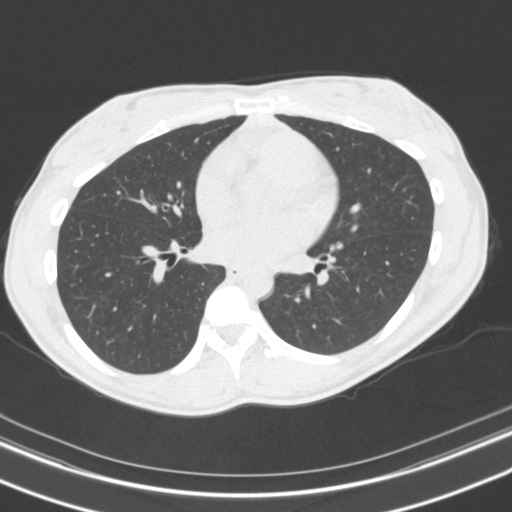
[im 87/156  mediastinal]
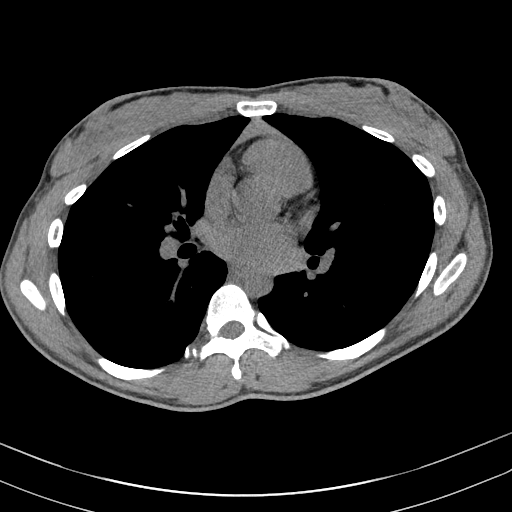
[im 87/156  lung]
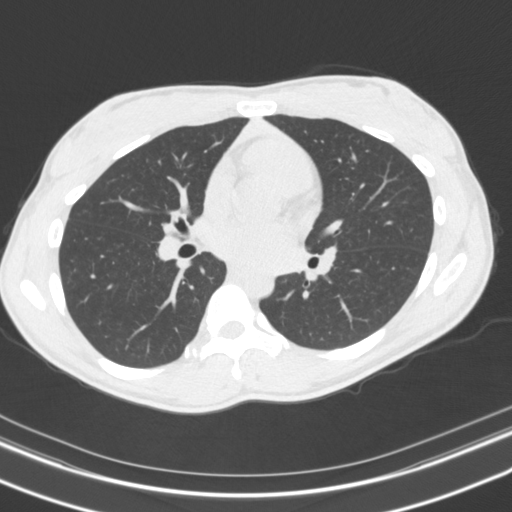
[im 94/156  lung]
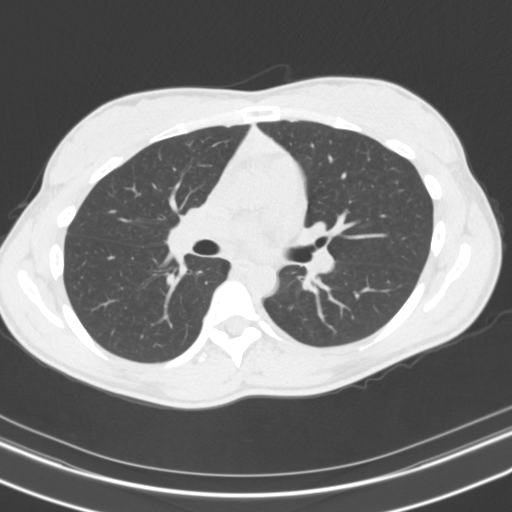
[im 104/156  lung]
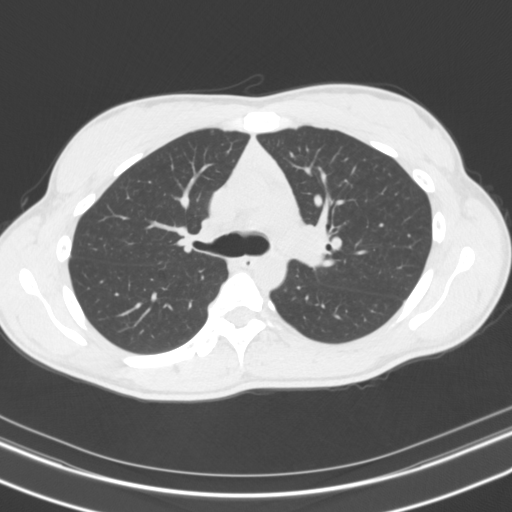
[im 115/156  lung]
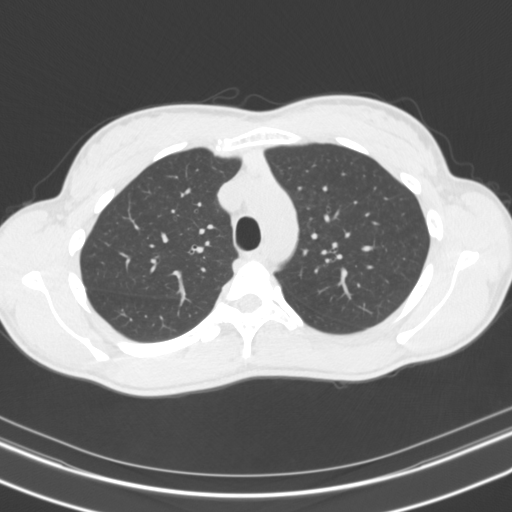
[im 125/156  mediastinal]
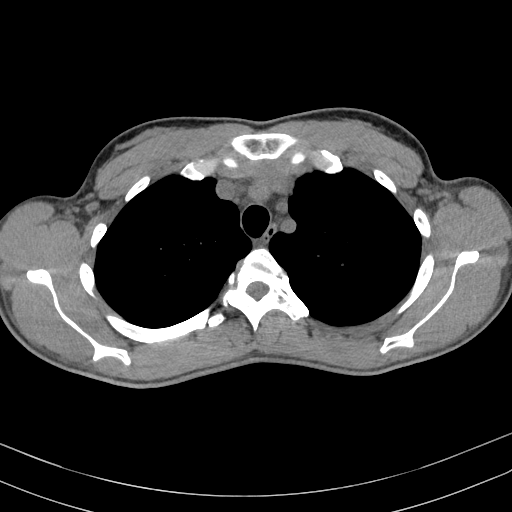
[im 125/156  lung]
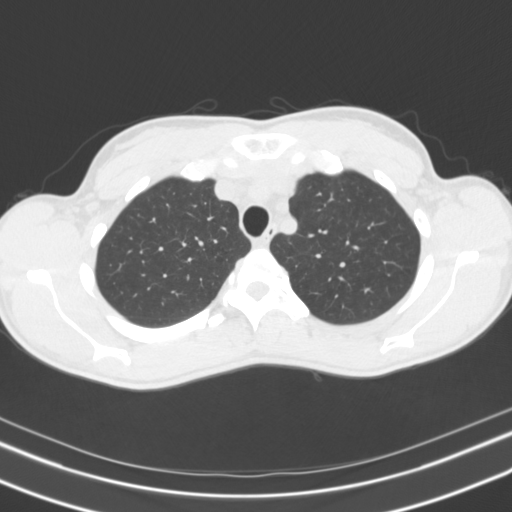
[im 133/156  lung]
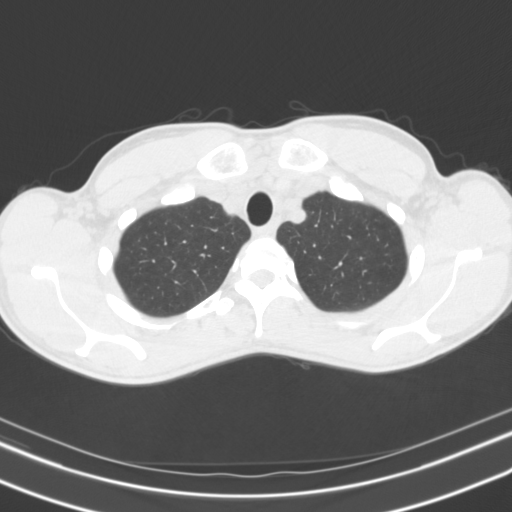
[im 144/156  lung]
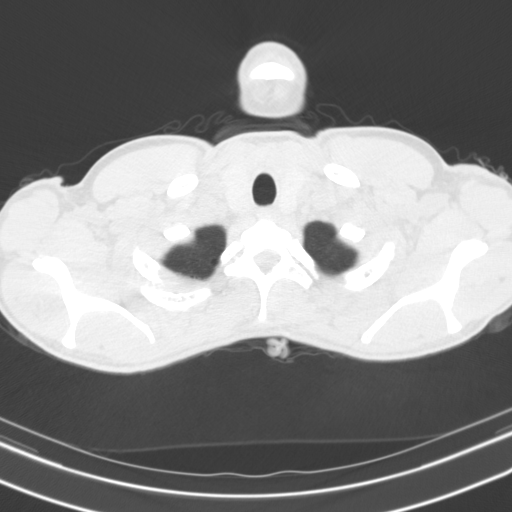

[15 of 34 positions shown; findings below may reference images not displayed]

FINDINGS: Cardiovascular: The heart size is normal. No substantial pericardial
effusion. No thoracic aortic aneurysm.

Mediastinum/Nodes: Wedge-shaped soft tissue attenuation in the
anterior mediastinum is likely related to thymic remnant No
mediastinal lymphadenopathy. No evidence for gross hilar
lymphadenopathy although assessment is limited by the lack of
intravenous contrast on today's study. The esophagus has normal
imaging features. There is no axillary lymphadenopathy.

Lungs/Pleura: 5 mm left lower lobe pulmonary nodule is stable in the
interval. No other pulmonary nodule or mass. No focal airspace
consolidation. No pleural effusion.

Upper Abdomen: Small to moderate volume ascites.

Musculoskeletal: No worrisome lytic or sclerotic osseous
abnormality.
IMPRESSION: 1. Wedge-shaped soft tissue attenuation anterior mediastinum, more
confluent than typically seen in the setting of thymic remnant
although this is likely the etiology. As lymphoma can present with
similar imaging features, follow-up is warranted. Consider repeat CT
chest in 3 months to ensure stability. PET-CT could be used to
further evaluate as clinically warranted.
2. Stable 5 mm left lower lobe pulmonary nodule. While likely
benign, attention on follow-up recommended.

These results will be called to the ordering clinician or
representative by the Radiologist Assistant, and communication
documented in the PACS or [REDACTED].

## 2020-10-26 ENCOUNTER — Ambulatory Visit: Payer: BC Managed Care – PPO | Admitting: Emergency Medicine

## 2020-10-26 ENCOUNTER — Other Ambulatory Visit: Payer: Self-pay

## 2020-10-26 ENCOUNTER — Encounter: Payer: Self-pay | Admitting: Emergency Medicine

## 2020-10-26 VITALS — BP 118/72 | HR 81 | Temp 98.3°F | Ht 66.0 in | Wt 142.8 lb

## 2020-10-26 DIAGNOSIS — R911 Solitary pulmonary nodule: Secondary | ICD-10-CM | POA: Diagnosis not present

## 2020-10-26 NOTE — Progress Notes (Signed)
Subjective:    Patient ID: Angelica Blankenship, female    DOB: Jan 04, 1991, 30 y.o.   MRN: [id number]  HPI 30 year old woman with history of minimal tobacco use (5 - 7 pack years), stage IV endometriosis, hypertension.  She has been under evaluation by OB/GYN for her endometriosis which includes abdominal wall disease to the level of skin, bilateral endometriomas, rectal disease, some superficial endometriosis including at the right hemidiaphragm complicated by recurrent ascites and pelvic discomfort.  She has required surgery.  No longer smoking.  She does use THC  She denies any dyspnea. No cough. Never hemoptysis.   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  Also noted was a 5 mm left lower lobe pulmonary nodule  CT scan of the chest 09/29/2020 reviewed by me shows no interval change in the anterior mediastinal soft tissue density, no adenopathy and stable 5 mm pulmonary nodule.  No immediate relatives with lung CA.    Review of Systems As per [facility]  Past Medical History:  Diagnosis Date  . Ascites   . Endometriosis   . Headache   . Heart murmur    asymptomatic  . Hypertension    No medications prescribed     Family History  Problem Relation Age of Onset  . Hypertension Mother   . Hypertension Sister   . Breast cancer Maternal Aunt   . Hypertension Maternal Grandmother   . Cervical cancer Maternal Grandmother   . Ovarian cancer Neg Hx   . Uterine cancer Neg Hx   . Colon cancer Neg Hx      Social History   Socioeconomic History  . Marital status: Single    Spouse name: Not on file  . Number of children: 0  . Years of education: Not on file  . Highest education level: Not on file  Occupational History  . Not on file  Tobacco Use  . Smoking status: Former Smoker    Packs/day: 0.10    Years: 8.00    Pack years:  0.80    Types: Cigars    Quit date: 12/02/2016    Years since quitting: 3.9  . Smokeless tobacco: Never Used  Vaping Use  . Vaping Use: Never used  Substance and Sexual Activity  . Alcohol use: Yes    Comment: occasional  . Drug use: Yes    Frequency: 35.0 times per week    Types: Marijuana    Comment: uses daily  . Sexual activity: Yes    Birth control/protection: None  Other Topics Concern  . Not on file  Social History Narrative  . Not on file   Social Determinants of Health   Financial Resource Strain: Not on file  Food Insecurity: Not on file  Transportation Needs: Not on file  Physical Activity: Not on file  Stress: Not on file  Social Connections: Not on file  Intimate Partner Violence: Not on file   [place] native.  no occupational or other inhaled exposures  No Known Allergies   Outpatient Medications Prior to Visit  Medication Sig Dispense Refill  . acetaminophen (TYLENOL) 500 MG tablet Take 1,000 mg by mouth every 6 (six) hours as needed for moderate pain.     Marland Kitchen aspirin-acetaminophen-caffeine (EXCEDRIN MIGRAINE) 250-250-65 MG tablet Take 1 tablet by mouth every 6 (six) hours as needed for headache.    . ibuprofen (ADVIL) 800  MG tablet Take 1 tablet (800 mg total) by mouth every 8 (eight) hours as needed for moderate pain. For AFTER surgery only 30 tablet 0  . Multiple Vitamin (MULTIVITAMIN WITH MINERALS) TABS tablet Take 1 tablet by mouth daily.    Marland Kitchen senna-docusate (SENOKOT-S) 8.6-50 MG tablet Take 2 tablets by mouth at bedtime. For AFTER surgery, do not take if having diarrhea (Patient not taking: Reported on 10/26/2020) 30 tablet 0   No facility-administered medications prior to visit.        Objective:   Physical Exam Vitals:   10/26/20 1406  BP: 118/72   Gen: Pleasant, well-nourished, in no distress,  normal affect  ENT: No lesions,  mouth clear,  oropharynx clear, no postnasal drip, tongue piercings  Neck: No JVD, no stridor  Lungs: No  use of accessory muscles, no crackles or wheezing on normal respiration, no wheeze on forced expiration  Cardiovascular: RRR, heart sounds normal, no murmur or gallops, no peripheral edema  Musculoskeletal: No deformities, no cyanosis or clubbing  Neuro: alert, awake, non focal  Skin: Warm, no lesions or rash      Assessment & Plan:  Pulmonary nodule 5 mm left lower lobe pulmonary nodule in a patient with a minimal tobacco history, no immediate family history of lung cancer, no exposures.  Suspect that this is a benign finding but given her history of stage IV endometriosis certainly must consider that this is embolic met of endometrial tissue.  She denies any hemoptysis, dyspnea.  We will plan to follow her nodule for stability, at least 2 years total.  Next scan will be in late September 2022.  Discussed with her to follow for any changes in breathing, cough, certainly any development of hemoptysis.   Baltazar Apo, MD, PhD 10/26/2020, 2:31 PM [facility] [phone] or if no answer before 7:00PM call [phone] For any issues after 7:00PM please call eLink [phone]

## 2020-10-26 NOTE — Assessment & Plan Note (Signed)
5 mm left lower lobe pulmonary nodule in a patient with a minimal tobacco history, no immediate family history of lung cancer, no exposures.  Suspect that this is a benign finding but given her history of stage IV endometriosis certainly must consider that this is embolic met of endometrial tissue.  She denies any hemoptysis, dyspnea.  We will plan to follow her nodule for stability, at least 2 years total.  Next scan will be in late September 2022.  Discussed with her to follow for any changes in breathing, cough, certainly any development of hemoptysis.

## 2020-10-26 NOTE — Patient Instructions (Signed)
We will plan to repeat your CT scan of the chest in late September 2022 to compare with your priors. Follow Dr. Delton Coombes in September after your CT so that we can review the results together.

## 2020-11-13 ENCOUNTER — Other Ambulatory Visit: Payer: Self-pay | Admitting: Gynecologic Oncology

## 2020-11-13 ENCOUNTER — Telehealth: Payer: Self-pay | Admitting: *Deleted

## 2020-11-13 ENCOUNTER — Encounter: Payer: Self-pay | Admitting: Gynecologic Oncology

## 2020-11-13 NOTE — Progress Notes (Signed)
Per Dr. Pricilla Holm, patient can receive another lupron injection. Orders are in supportive therapy plan. See RN note as well for additional information.

## 2020-11-13 NOTE — Telephone Encounter (Signed)
Left message for patient to call back to discuss the message she left via mychart

## 2020-11-18 ENCOUNTER — Telehealth: Payer: Self-pay | Admitting: *Deleted

## 2020-11-18 NOTE — Telephone Encounter (Signed)
Lupron authorized, called the patient and left a message to call the office back to schedule injection appt

## 2020-11-20 ENCOUNTER — Telehealth: Payer: Self-pay | Admitting: *Deleted

## 2020-11-20 NOTE — Telephone Encounter (Signed)
Patient called and schedule an injection appt for 5/27

## 2020-11-23 ENCOUNTER — Encounter: Payer: Self-pay | Admitting: Gynecologic Oncology

## 2020-11-26 ENCOUNTER — Telehealth: Payer: Self-pay | Admitting: *Deleted

## 2020-11-26 NOTE — Telephone Encounter (Signed)
Moved the patient's injection appt to an earlier time

## 2020-11-27 ENCOUNTER — Other Ambulatory Visit: Payer: Self-pay

## 2020-11-27 ENCOUNTER — Inpatient Hospital Stay: Payer: BC Managed Care – PPO | Attending: Gynecologic Oncology

## 2020-11-27 VITALS — BP 132/80 | HR 65 | Temp 99.0°F | Resp 16

## 2020-11-27 DIAGNOSIS — Z5111 Encounter for antineoplastic chemotherapy: Secondary | ICD-10-CM | POA: Diagnosis present

## 2020-11-27 DIAGNOSIS — N809 Endometriosis, unspecified: Secondary | ICD-10-CM | POA: Insufficient documentation

## 2020-11-27 DIAGNOSIS — N9489 Other specified conditions associated with female genital organs and menstrual cycle: Secondary | ICD-10-CM

## 2020-11-27 MED ORDER — LEUPROLIDE ACETATE (3 MONTH) 22.5 MG IM KIT
11.2500 mg | PACK | Freq: Once | INTRAMUSCULAR | Status: AC
  Administered 2020-11-27: 11.25 mg via INTRAMUSCULAR
  Filled 2020-11-27: qty 22.5

## 2020-11-27 NOTE — Patient Instructions (Signed)
Leuprolide injection What is this medicine? LEUPROLIDE (loo PROE lide) is a man-made hormone. It is used to treat the symptoms of prostate cancer. This medicine may also be used to treat children with early onset of puberty. It may be used for other hormonal conditions. This medicine may be used for other purposes; ask your health care provider or pharmacist if you have questions. COMMON BRAND NAME(S): Lupron What should I tell my health care provider before I take this medicine? They need to know if you have any of these conditions:  diabetes  heart disease or previous heart attack  high blood pressure  high cholesterol  pain or difficulty passing urine  spinal cord metastasis  stroke  tobacco smoker  an unusual or allergic reaction to leuprolide, benzyl alcohol, other medicines, foods, dyes, or preservatives  pregnant or trying to get pregnant  breast-feeding How should I use this medicine? This medicine is for injection under the skin or into a muscle. You will be taught how to prepare and give this medicine. Use exactly as directed. Take your medicine at regular intervals. Do not take your medicine more often than directed. It is important that you put your used needles and syringes in a special sharps container. Do not put them in a trash can. If you do not have a sharps container, call your pharmacist or healthcare provider to get one. A special MedGuide will be given to you by the pharmacist with each prescription and refill. Be sure to read this information carefully each time. Talk to your pediatrician regarding the use of this medicine in children. While this medicine may be prescribed for children as young as 8 years for selected conditions, precautions do apply. Overdosage: If you think you have taken too much of this medicine contact a poison control center or emergency room at once. NOTE: This medicine is only for you. Do not share this medicine with others. What if  I miss a dose? If you miss a dose, take it as soon as you can. If it is almost time for your next dose, take only that dose. Do not take double or extra doses. What may interact with this medicine? Do not take this medicine with any of the following medications:  chasteberry  cisapride  dronedarone  pimozide  thioridazine This medicine may also interact with the following medications:  herbal or dietary supplements, like black cohosh or DHEA  female hormones, like estrogens or progestins and birth control pills, patches, rings, or injections  female hormones, like testosterone  other medicines that prolong the QT interval (abnormal heart rhythm) This list may not describe all possible interactions. Give your health care provider a list of all the medicines, herbs, non-prescription drugs, or dietary supplements you use. Also tell them if you smoke, drink alcohol, or use illegal drugs. Some items may interact with your medicine. What should I watch for while using this medicine? Visit your doctor or health care professional for regular checks on your progress. During the first week, your symptoms may get worse, but then will improve as you continue your treatment. You may get hot flashes, increased bone pain, increased difficulty passing urine, or an aggravation of nerve symptoms. Discuss these effects with your doctor or health care professional, some of them may improve with continued use of this medicine. Female patients may experience a menstrual cycle or spotting during the first 2 months of therapy with this medicine. If this continues, contact your doctor or health care professional.   This medicine may increase blood sugar. Ask your healthcare provider if changes in diet or medicines are needed if you have diabetes. What side effects may I notice from receiving this medicine? Side effects that you should report to your doctor or health care professional as soon as possible:  allergic  reactions like skin rash, itching or hives, swelling of the face, lips, or tongue  breathing problems  chest pain  depression or memory disorders  pain in your legs or groin  pain at site where injected  severe headache  signs and symptoms of high blood sugar such as being more thirsty or hungry or having to urinate more than normal. You may also feel very tired or have blurry vision  swelling of the feet and legs  visual changes  vomiting Side effects that usually do not require medical attention (report to your doctor or health care professional if they continue or are bothersome):  breast swelling or tenderness  decrease in sex drive or performance  diarrhea  hot flashes  loss of appetite  muscle, joint, or bone pains  nausea  redness or irritation at site where injected  skin problems or acne This list may not describe all possible side effects. Call your doctor for medical advice about side effects. You may report side effects to FDA at 1-800-FDA-1088. Where should I keep my medicine? Keep out of the reach of children. Store below 25 degrees C (77 degrees F). Do not freeze. Protect from light. Do not use if it is not clear or if there are particles present. Throw away any unused medicine after the expiration date. NOTE: This sheet is a summary. It may not cover all possible information. If you have questions about this medicine, talk to your doctor, pharmacist, or health care provider.  2021 Elsevier/Gold Standard (2019-05-22 10:57:41)  

## 2021-03-05 ENCOUNTER — Other Ambulatory Visit: Payer: BC Managed Care – PPO

## 2021-03-09 ENCOUNTER — Inpatient Hospital Stay: Admission: RE | Admit: 2021-03-09 | Payer: BC Managed Care – PPO | Source: Ambulatory Visit

## 2021-03-18 ENCOUNTER — Ambulatory Visit: Payer: BC Managed Care – PPO | Admitting: Emergency Medicine

## 2021-03-29 ENCOUNTER — Ambulatory Visit
Admission: RE | Admit: 2021-03-29 | Discharge: 2021-03-29 | Disposition: A | Discharge status: Home / Self Care | Payer: BC Managed Care – PPO | Source: Ambulatory Visit | Attending: Emergency Medicine | Admitting: Emergency Medicine

## 2021-03-29 DIAGNOSIS — R911 Solitary pulmonary nodule: Secondary | ICD-10-CM

## 2021-03-29 IMAGING — CT CT CHEST SUPER D W/O CM
1 series · 15 of 34 positions shown, 19 images · non-contrast
Comparison: [DATE]

CLINICAL DATA: Follow-up of pulmonary nodule. Occasional shortness
of breath. Hypertension. Nonsmoker.

EXAM:
CT CHEST WITHOUT CONTRAST
TECHNIQUE: Multidetector CT imaging of the chest was performed using thin slice
collimation for electromagnetic bronchoscopy planning purposes,
without intravenous contrast.

[Series 2: chest w/(date) · axial · 0.67mm/px · z∈[-287,-37]mm · 15 of 147 slices shown, 19 images]
[im 11/147  mediastinal]
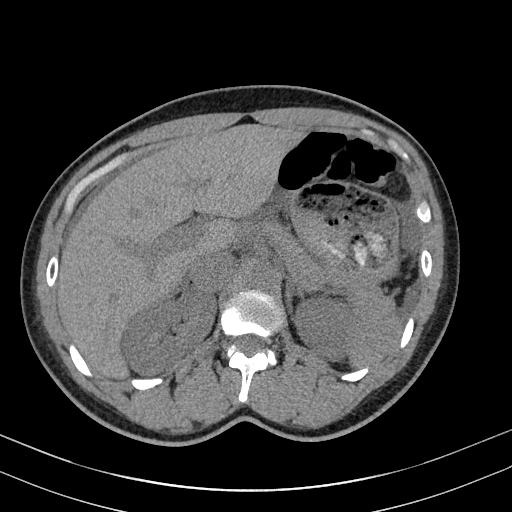
[im 11/147  lung]
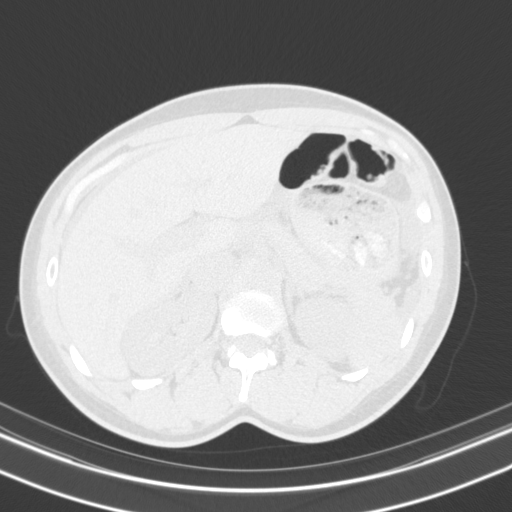
[im 22/147  lung]
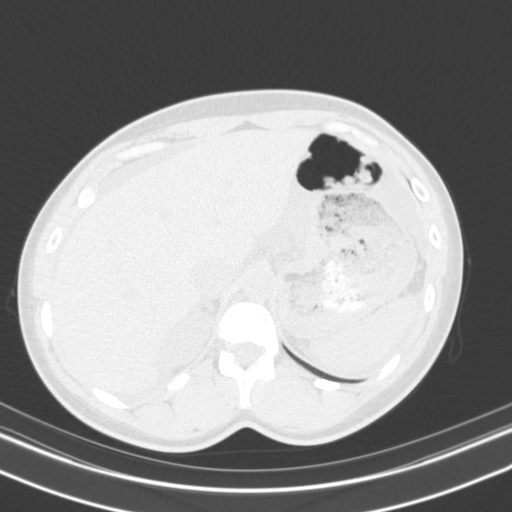
[im 30/147  lung]
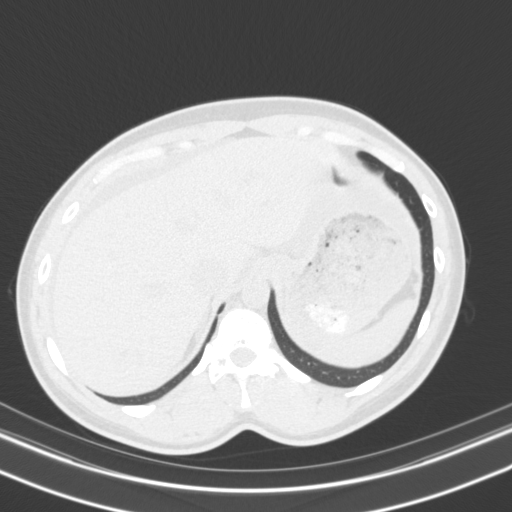
[im 38/147  lung]
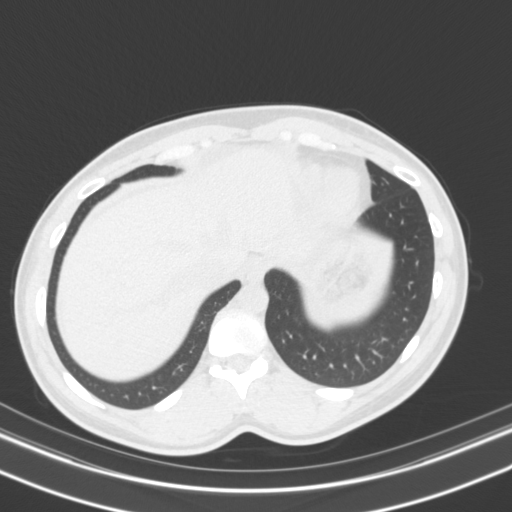
[im 49/147  mediastinal]
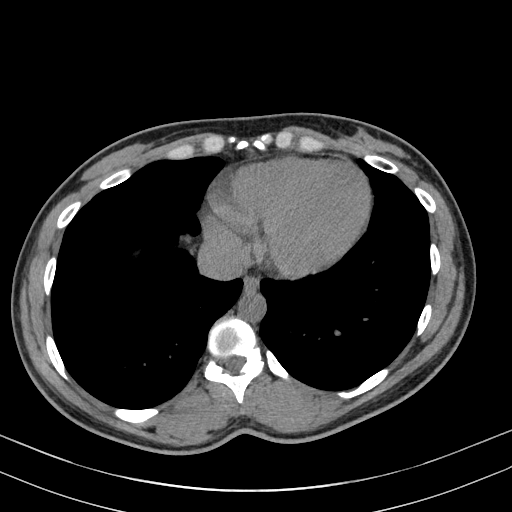
[im 49/147  lung]
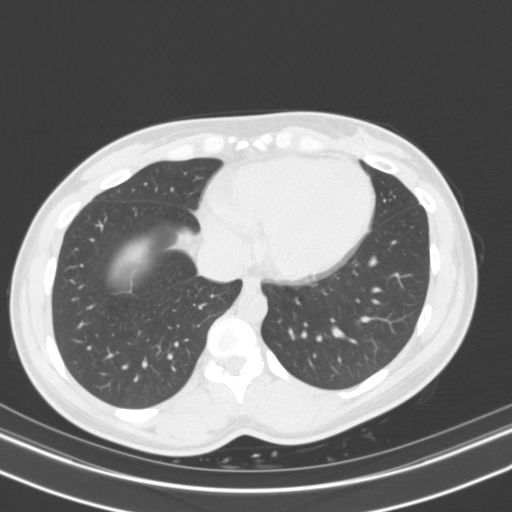
[im 59/147  lung]
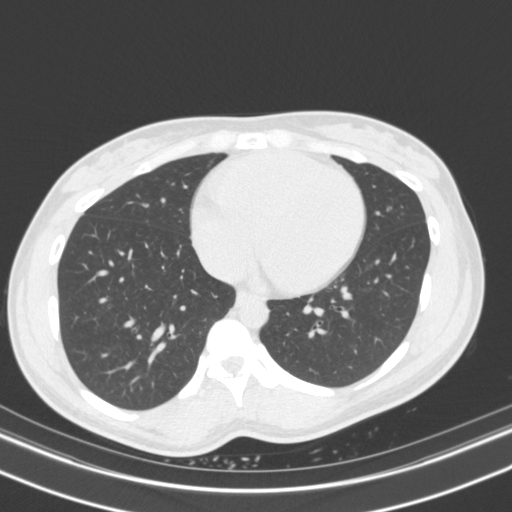
[im 65/147  lung]
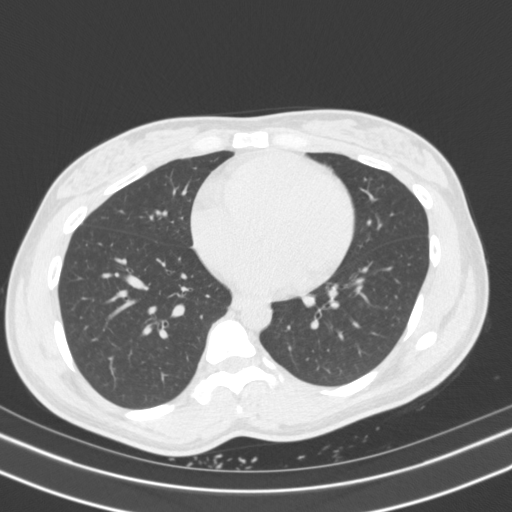
[im 76/147  lung]
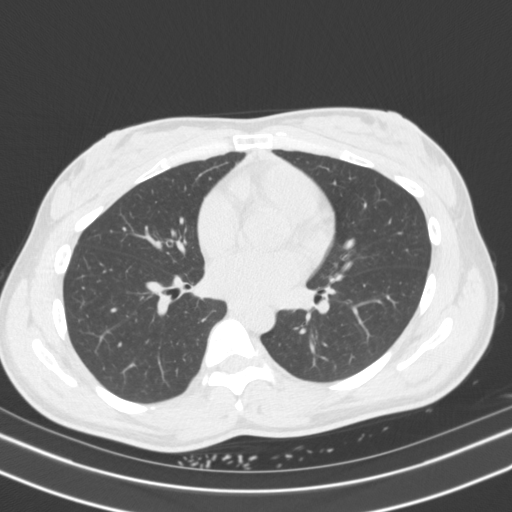
[im 82/147  mediastinal]
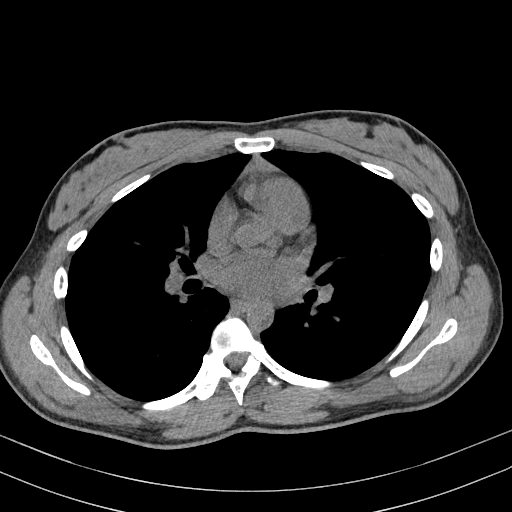
[im 82/147  lung]
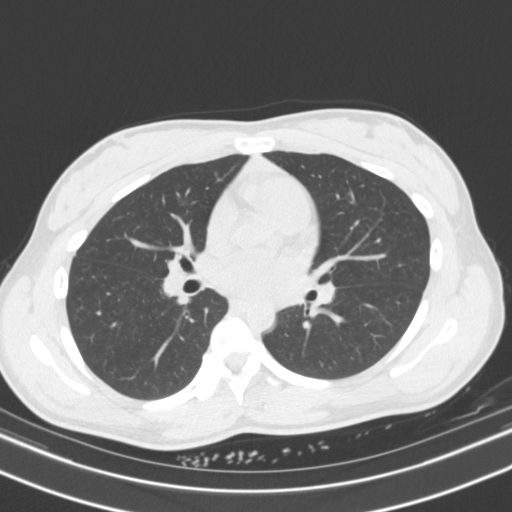
[im 88/147  lung]
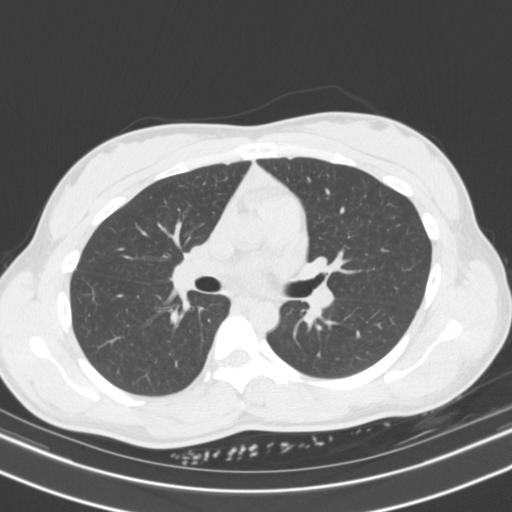
[im 98/147  lung]
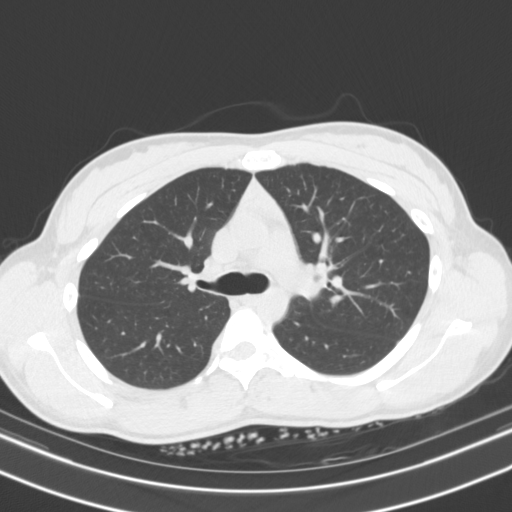
[im 109/147  lung]
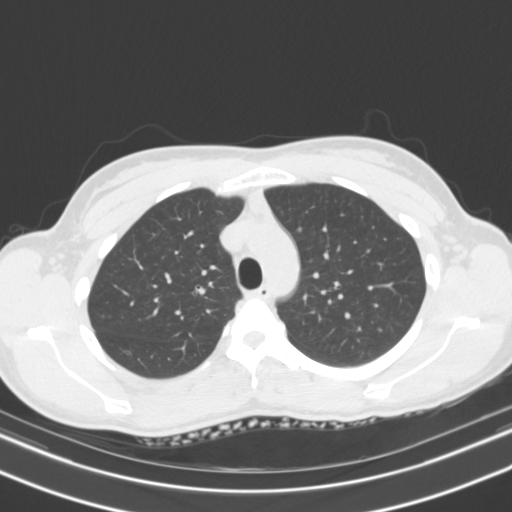
[im 117/147  mediastinal]
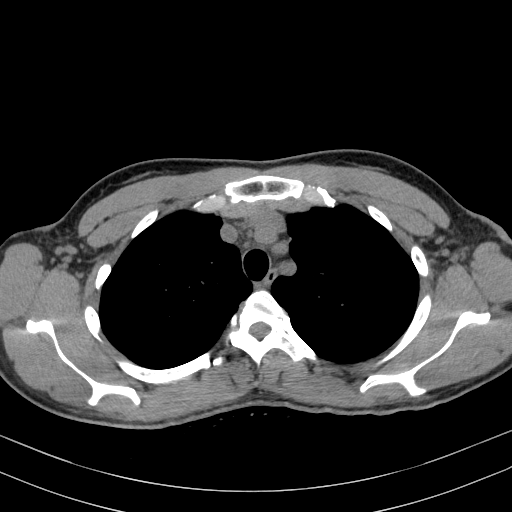
[im 117/147  lung]
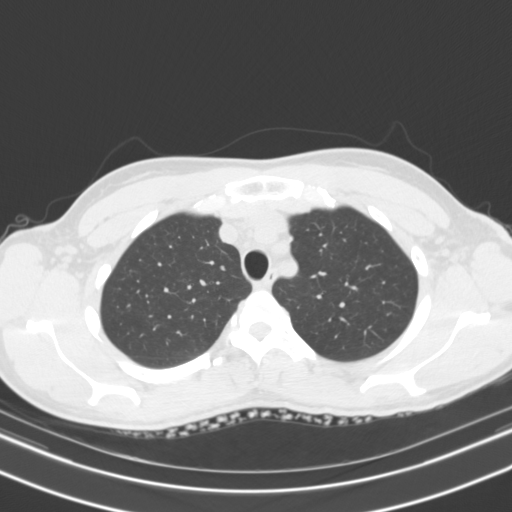
[im 125/147  lung]
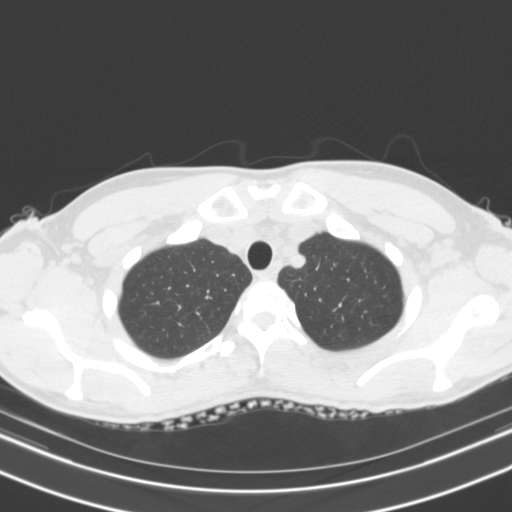
[im 136/147  lung]
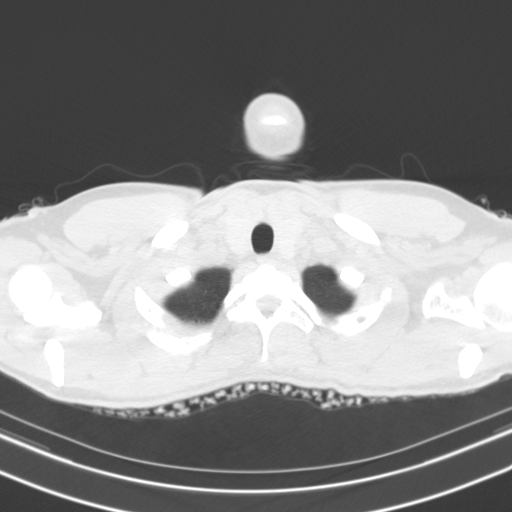

[15 of 34 positions shown; findings below may reference images not displayed]

FINDINGS: Cardiovascular: Normal caliber of the aorta and branch vessels.
Normal heart size, without pericardial effusion.

Mediastinum/Nodes: Multiple small bilateral axillary nodes. No
mediastinal or definite hilar adenopathy, given limitations of
unenhanced CT.

Redemonstration of triangular shaped soft tissue density within the
anterior mediastinum. Maximally 2.8 x 2.1 cm on 55/2 versus 3.1 x
2.3 cm on the prior exam at the same level (when remeasured).

Lungs/Pleura: No pleural fluid. 4 mm left lower lobe pulmonary
nodule on 106/5 is similar to on the prior exam (when remeasured).

No new or enlarging pulmonary nodules.

Upper Abdomen: Decrease in small volume perihepatic and perisplenic
ascites. Normal imaged portions of the liver, spleen, stomach,
pancreas, adrenal glands, kidneys.

Musculoskeletal: Moderate convex right thoracic spine curvature.
IMPRESSION: 1. Decrease in anterior mediastinal triangular shaped soft tissue
density, most consistent with residual thymus, possibly resolving
thymic rebound.
2. 6 months stability of an isolated 4 mm left lower lobe pulmonary
nodule, favoring a benign etiology.
3. Decrease in small volume upper abdominal ascites.

## 2021-04-02 ENCOUNTER — Ambulatory Visit: Payer: BC Managed Care – PPO | Admitting: Emergency Medicine

## 2021-04-02 ENCOUNTER — Other Ambulatory Visit: Payer: Self-pay

## 2021-04-02 ENCOUNTER — Ambulatory Visit (INDEPENDENT_AMBULATORY_CARE_PROVIDER_SITE_OTHER): Payer: BC Managed Care – PPO | Admitting: Emergency Medicine

## 2021-04-02 ENCOUNTER — Encounter: Payer: Self-pay | Admitting: Emergency Medicine

## 2021-04-02 DIAGNOSIS — R911 Solitary pulmonary nodule: Secondary | ICD-10-CM

## 2021-04-02 NOTE — Assessment & Plan Note (Signed)
Reassuring CT scan of the chest with decrease in size of her anterior mediastinal soft tissue, probable thymic remnant, as well and stability of her small pulmonary nodule.  I think since she has a history of endometriosis it would be best to repeat her CT scan 1 more time of the 2-year mark to ensure stability.  If there is no interval change at that time then we should be able to stop following dedicated scans, follow clinically.

## 2021-04-02 NOTE — Progress Notes (Signed)
   Subjective:    Patient ID: Angelica Blankenship, female    DOB: 08-28-1990, 30 y.o.   MRN: [id number]  HPI 30 year old woman with history of minimal tobacco use (5 - 7 pack years), stage IV endometriosis, hypertension.  She has been under evaluation by OB/GYN for her endometriosis which includes abdominal wall disease to the level of skin, bilateral endometriomas, rectal disease, some superficial endometriosis including at the right hemidiaphragm complicated by recurrent ascites and pelvic discomfort.  She has required surgery.  No longer smoking.  She does use THC  She denies any dyspnea. No cough. Never hemoptysis.   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  Also noted was a 5 mm left lower lobe pulmonary nodule  CT scan of the chest 09/29/2020 reviewed by me shows no interval change in the anterior mediastinal soft tissue density, no adenopathy and stable 5 mm pulmonary nodule.  No immediate relatives with lung CA.    ROV 04/02/21 --follow-up visit for pleasant 30 year old woman with a minimal tobacco history, stage IV endometriosis and hypertension.  She been followed extensively by OB/GYN for her endometriosis.  Part of the evaluation has included CT scans of the chest abdomen and pelvis that have shown some anterior mediastinal soft tissue (likely thymic remnant) without any adenopathy, and a 5 mm left lower lobe pulmonary nodule.  It was stable between 02/21/2020 and 09/20/2020.  She returns now after a repeat CT chest 9/26. She is doing well. She has experienced some fleeting chest tightness before. No cough. No CP.   CT scan of the chest 03/29/2021 reviewed by me, shows decrease in size of the anterior mediastinal soft density tissue.  The left lower lobe pulmonary nodule is similar, measured currently at 4 mm.  Reassuring scan.   Review  of Systems As per HPi       Objective:   Physical Exam Vitals:   04/02/21 1549  BP: 130/78  Pulse: 78  Temp: 99.2 F (37.3 C)  TempSrc: Oral  SpO2: 100%  Weight: 154 lb 12.8 oz (70.2 kg)  Height: 5\' 6"  (1.676 m)   Gen: Pleasant, well-nourished, in no distress,  normal affect  ENT: No lesions,  mouth clear,  oropharynx clear, no postnasal drip, tongue piercings  Neck: No JVD, no stridor  Lungs: No use of accessory muscles, no crackles or wheezing on normal respiration, no wheeze on forced expiration  Cardiovascular: RRR, heart sounds normal, no murmur or gallops, no peripheral edema  Musculoskeletal: No deformities, no cyanosis or clubbing  Neuro: alert, awake, non focal  Skin: Warm, no lesions or rash      Assessment & Plan:  Pulmonary nodule Reassuring CT scan of the chest with decrease in size of her anterior mediastinal soft tissue, probable thymic remnant, as well and stability of her small pulmonary nodule.  I think since she has a history of endometriosis it would be best to repeat her CT scan 1 more time of the 2-year mark to ensure stability.  If there is no interval change at that time then we should be able to stop following dedicated scans, follow clinically.   , MD, PhD 04/02/2021, 4:21 PM [facility] [phone] or if no answer before 7:00PM call [phone] For any issues after 7:00PM please call eLink [phone]

## 2021-04-02 NOTE — Patient Instructions (Signed)
We will plan to repeat your CT scan of the chest in August 2023. Follow Dr. Delton Coombes in August after your CT so that we can review the results together.

## 2021-07-14 ENCOUNTER — Other Ambulatory Visit: Payer: Self-pay | Admitting: Obstetrics and Gynecology

## 2021-07-14 DIAGNOSIS — R188 Other ascites: Secondary | ICD-10-CM

## 2021-07-15 ENCOUNTER — Other Ambulatory Visit: Payer: Self-pay | Admitting: Obstetrics and Gynecology

## 2021-07-15 ENCOUNTER — Ambulatory Visit (HOSPITAL_COMMUNITY)
Admission: RE | Admit: 2021-07-15 | Discharge: 2021-07-15 | Disposition: A | Discharge status: Home / Self Care | Payer: BC Managed Care – PPO | Source: Ambulatory Visit | Attending: Obstetrics and Gynecology | Admitting: Obstetrics and Gynecology

## 2021-07-15 ENCOUNTER — Other Ambulatory Visit: Payer: Self-pay

## 2021-07-15 ENCOUNTER — Other Ambulatory Visit (HOSPITAL_COMMUNITY): Payer: Self-pay | Admitting: Obstetrics and Gynecology

## 2021-07-15 DIAGNOSIS — R188 Other ascites: Secondary | ICD-10-CM

## 2021-07-15 DIAGNOSIS — E049 Nontoxic goiter, unspecified: Secondary | ICD-10-CM

## 2021-07-15 IMAGING — MR MR PELVIS WO/W CM
4 of 8 series · 21 of 48 positions shown · IV contrast (7ml GADAVIST)
Comparison: No priors.

CLINICAL DATA: 30-year-old female with history of ascites.

EXAM:
MRI ABDOMEN AND PELVIS WITHOUT AND WITH CONTRAST
TECHNIQUE: Multiplanar multisequence MR imaging of the abdomen and pelvis was
performed both before and after the administration of intravenous
contrast.
CONTRAST:  7mL GADAVIST GADOBUTROL 1 MMOL/ML IV SOLN

[Series 3: T2 · coronal · 6.0mm · 0.82mm/px · 4 of 31 slices shown (1 of 3)]
[im 1/31]
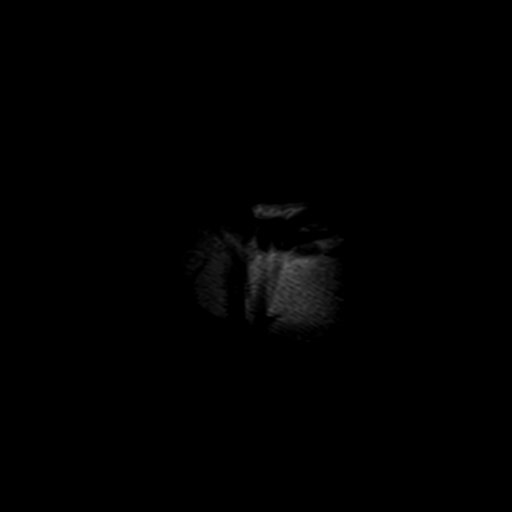
[im 11/31]
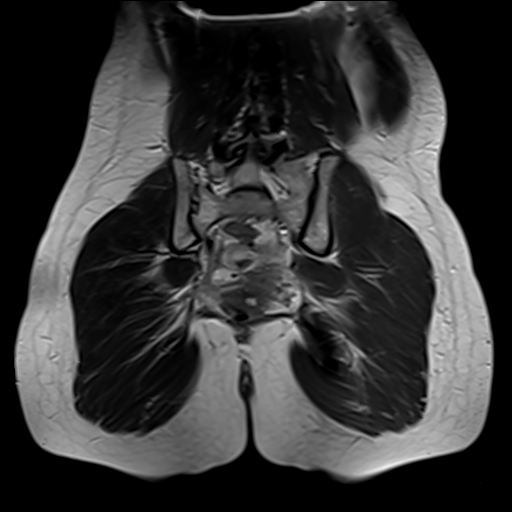
[im 21/31]
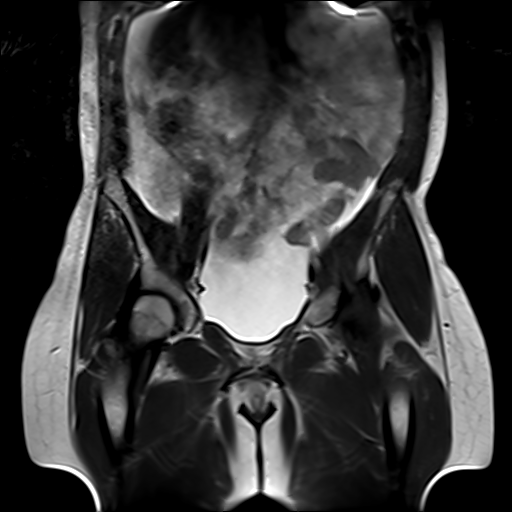
[im 31/31]
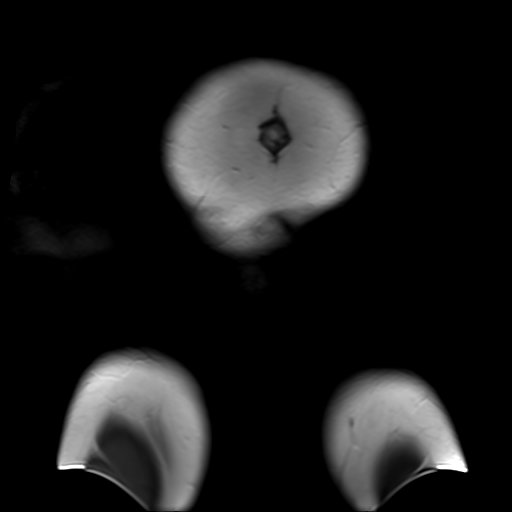

[Series 4: T2 · axial · 5.0mm · 1.56mm/px · z∈[-74,+148]mm · 6 of 38 slices shown (2 of 3)]
[im 1/38]
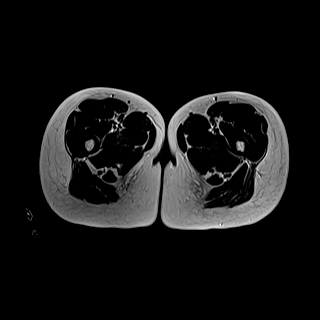
[im 8/38]
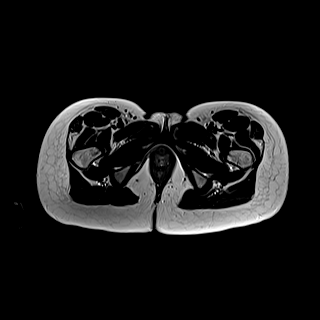
[im 15/38]
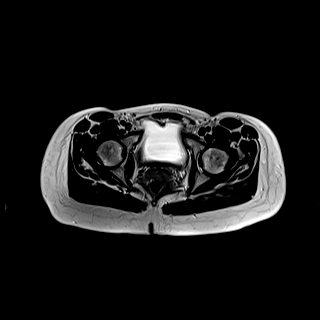
[im 23/38]
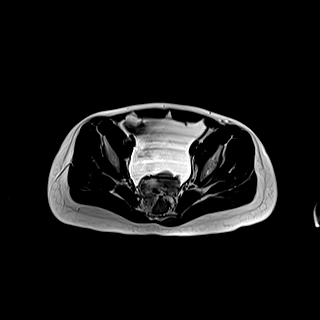
[im 30/38]
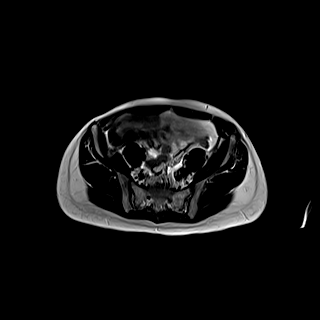
[im 38/38]
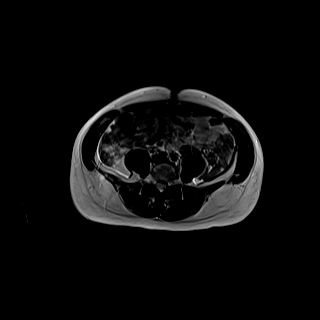

[Series 5: T2 · sagittal · 5.0mm · 0.55mm/px · 8 of 51 slices shown (3 of 3)]
[im 1/51]
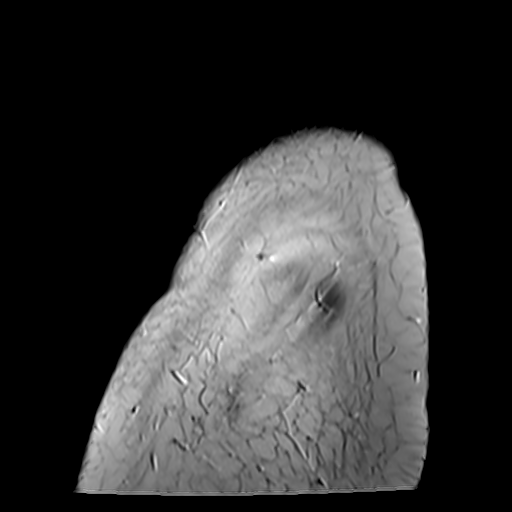
[im 8/51]
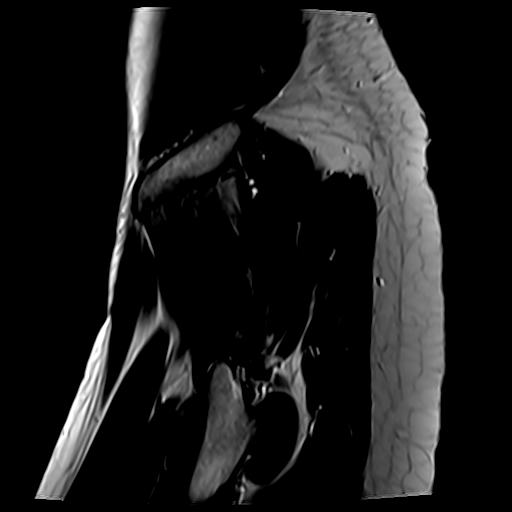
[im 15/51]
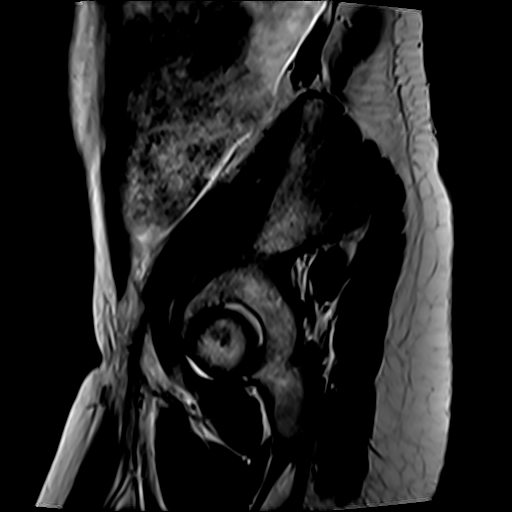
[im 22/51]
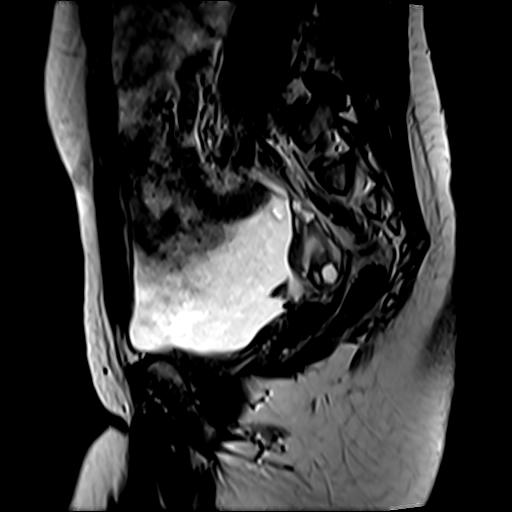
[im 29/51]
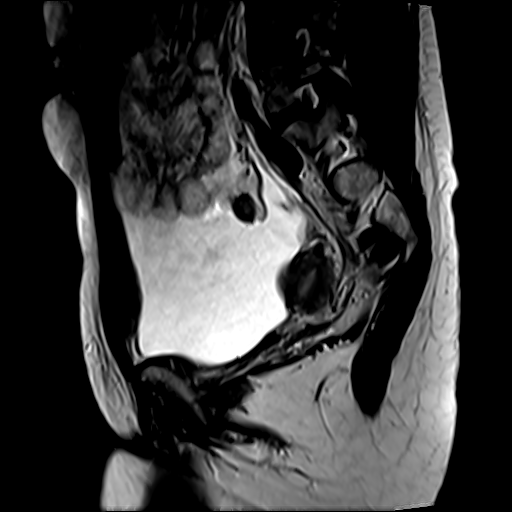
[im 36/51]
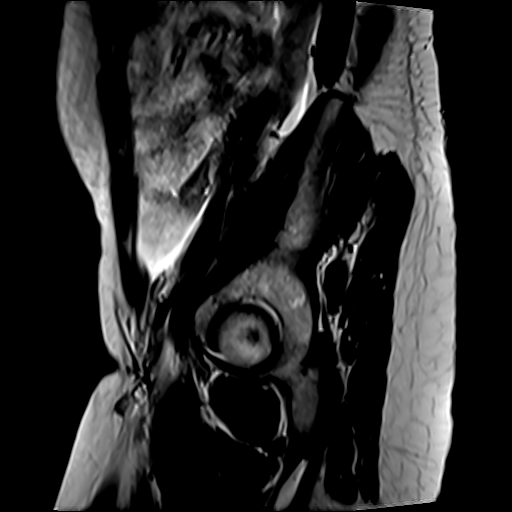
[im 43/51]
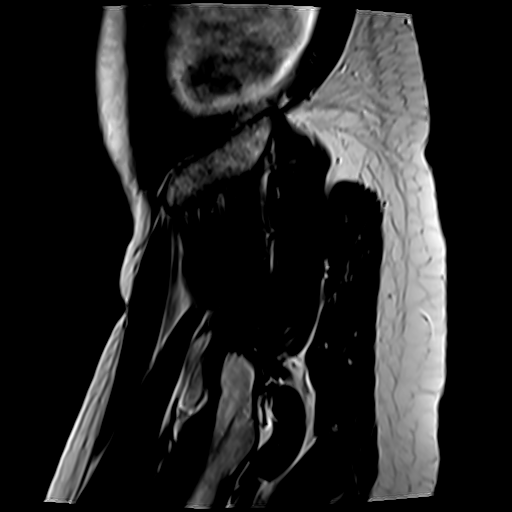
[im 51/51]
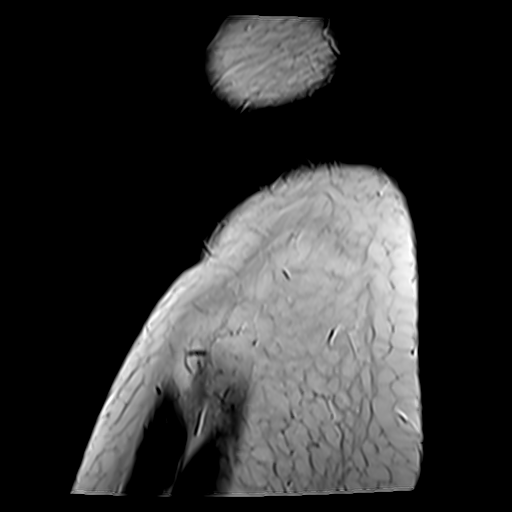

[Series 6: T1 · axial · 5.0mm · 0.68mm/px · z∈[-35,+157]mm · 3 of 40 slices shown]
[im 8/40]
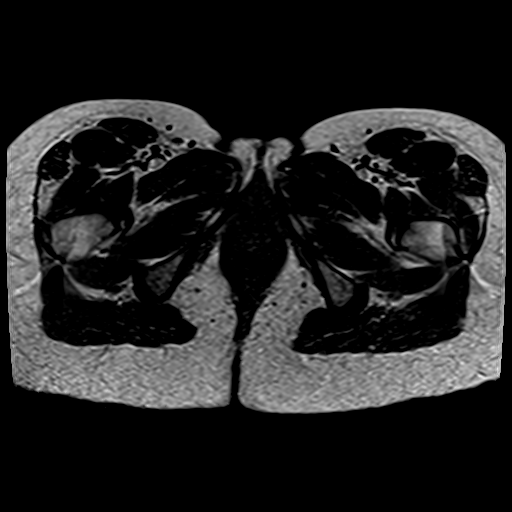
[im 24/40]
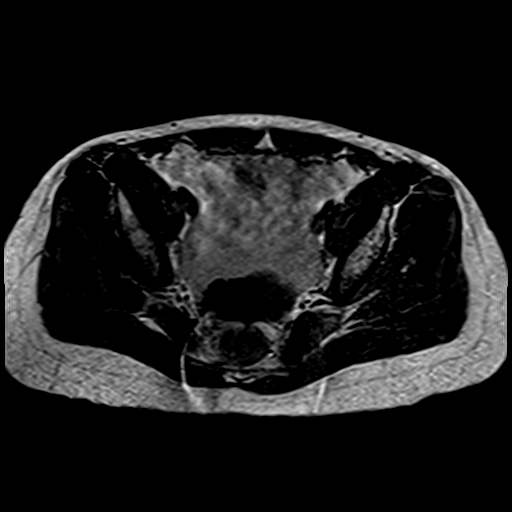
[im 40/40]
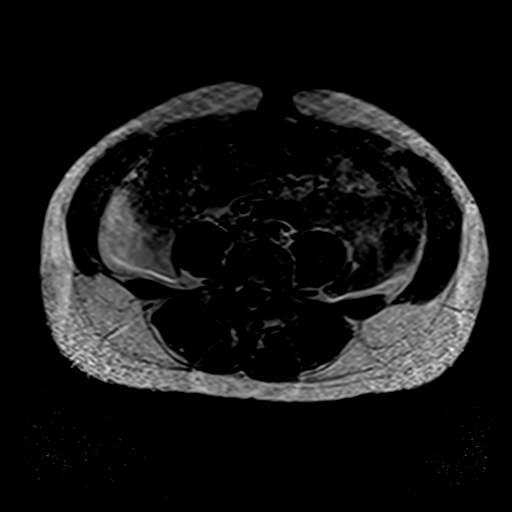

[21 of 48 positions shown; findings below may reference images not displayed]

FINDINGS: COMBINED FINDINGS FOR BOTH MR ABDOMEN AND PELVIS

Lower chest: Unremarkable.

Hepatobiliary: No suspicious cystic or solid hepatic lesions. No
intra or extrahepatic biliary ductal dilatation. Gallbladder is
normal in appearance.

Pancreas: No pancreatic mass. No pancreatic ductal dilatation. No
organized peripancreatic fluid collections or inflammatory changes.

Spleen: Small splenule inferior to the spleen. Otherwise,
unremarkable.

Adrenals/Urinary Tract: Bilateral kidneys and adrenal glands are
normal in appearance. No hydroureteronephrosis. Urinary bladder is
nearly completely decompressed, but otherwise normal in appearance.

Stomach/Bowel: Poorly evaluated by MRI, but unremarkable in
appearance.

Vascular/Lymphatic: No aneurysm identified in the abdomen or pelvis.
No lymphadenopathy noted in the abdomen or pelvis.

Reproductive: Uterus and left ovary are unremarkable in appearance.
In the superior aspect of the right ovary scratch the in or adjacent
to the superior aspect of the right ovary (axial image 17 of series
10) there is a 2.1 x 1.6 cm structure which is T1 isointense to
hypointense, mildly T2 hyperintense, and demonstrates peripheral rim
enhancement.

Other: Large volume of free fluid throughout the peritoneal cavity
which is T1 hyperintense, compatible with hemoperitoneum.

Musculoskeletal: No aggressive appearing osseous lesions are noted
in the visualized portions of the skeleton.
IMPRESSION: 1. Large volume of hemoperitoneum. Given the patient's history of
endometriosis, this is presumably related to active bleeding from an
endometrioma. Unfortunately, today's examination was protocol to
favor abdominal imaging rather than the pelvis. There is a candidate
lesion along the superior aspect of the right adnexal region, either
within or adjacent to the superior aspect of the right ovary, which
may represent a small endometrioma, however, the imaging features
are unusual and poorly evaluated given today's protocol limitations.
Repeat pelvic MRI with and without IV gadolinium could be considered
in the near future to better evaluate this finding and assess for
additional endometrial implants in the pelvis.

## 2021-07-15 IMAGING — MR MR ABDOMEN WO/W CM
14 series · 48 of 48 positions shown · IV contrast (gadavist)
Comparison: No priors.

CLINICAL DATA: 30-year-old female with history of ascites.

EXAM:
MRI ABDOMEN AND PELVIS WITHOUT AND WITH CONTRAST
TECHNIQUE: Multiplanar multisequence MR imaging of the abdomen and pelvis was
performed both before and after the administration of intravenous
contrast.
CONTRAST:  7mL GADAVIST GADOBUTROL 1 MMOL/ML IV SOLN

[Series 4: T2 fat-sat · axial · 6.0mm · 1.56mm/px · 1 of 36 slices shown]
[im 1/36]
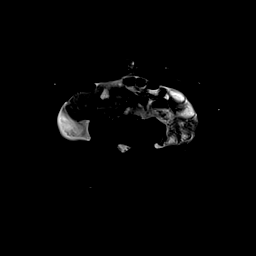

[Series 6: T2 · coronal · 6.0mm · 1.56mm/px · 2 of 30 slices shown (1 of 2)]
[im 1/30]
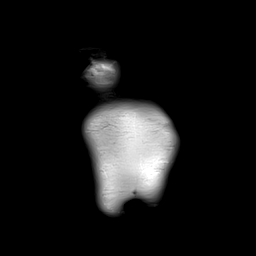
[im 30/30]
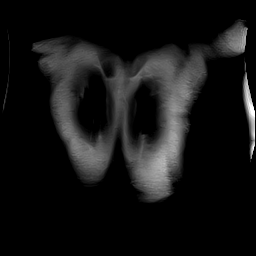

[Series 7: bSSFP · axial · 4.0mm · 0.84mm/px · z∈[+173,+385]mm · 3 of 54 slices shown]
[im 1/54]
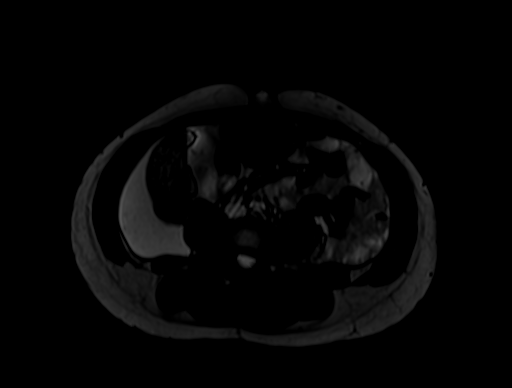
[im 27/54]
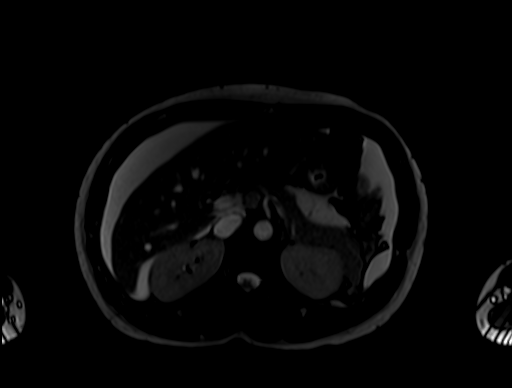
[im 54/54]
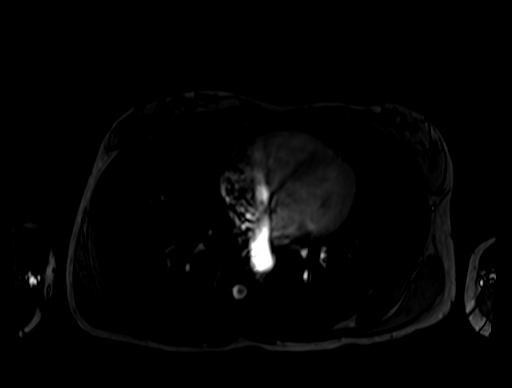

[Series 8: T1 · axial · 3.0mm · 1.25mm/px · z∈[+182,+395]mm · 4 of 72 slices shown (1 of 2)]
[im 1/72]
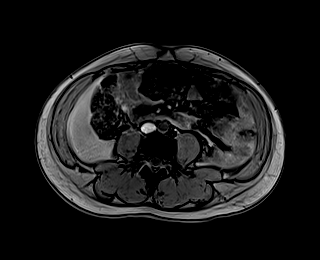
[im 24/72]
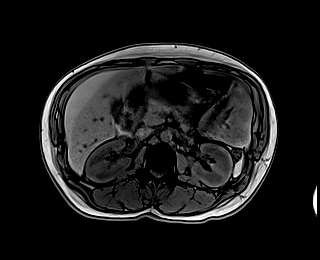
[im 48/72]
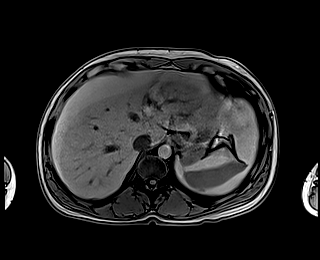
[im 72/72]
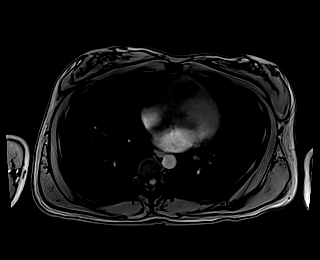

[Series 9: T1 · axial · 3.0mm · 1.25mm/px · z∈[+182,+395]mm · 4 of 72 slices shown (2 of 2)]
[im 1/72]
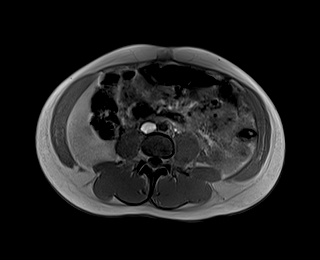
[im 24/72]
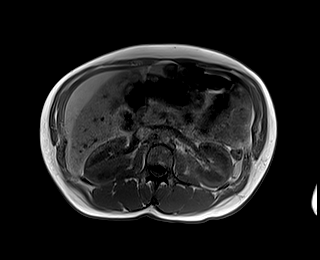
[im 48/72]
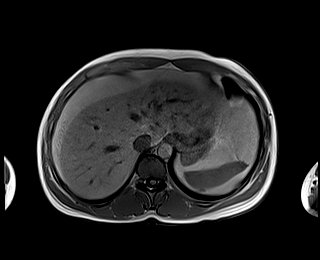
[im 72/72]
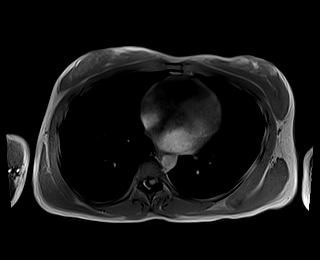

[Series 11: T1 dynamic · axial · 3.0mm · 1.25mm/px · z∈[+155,+392]mm · 4 of 80 slices shown (1 of 8)]
[im 1/80]
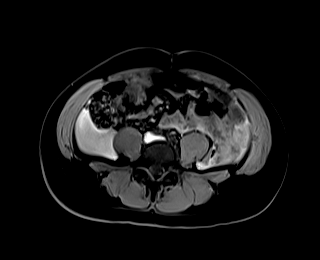
[im 27/80]
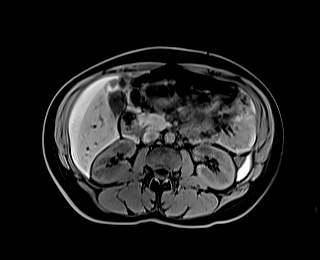
[im 53/80]
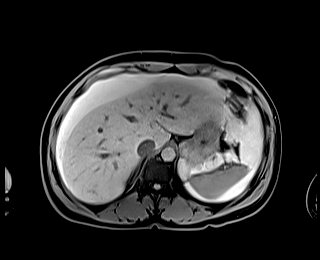
[im 80/80]
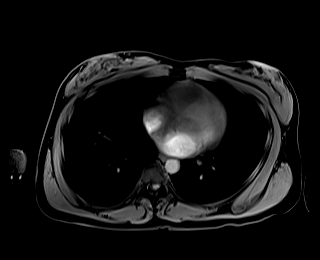

[Series 15: T1 dynamic · axial · 3.0mm · 1.25mm/px · z∈[+155,+392]mm · 4 of 80 slices shown (2 of 8)]
[im 1/80]
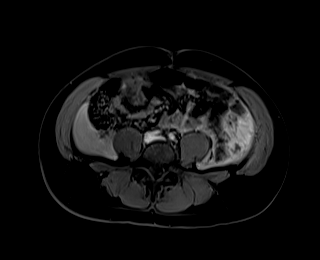
[im 27/80]
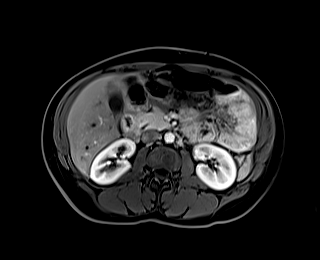
[im 53/80]
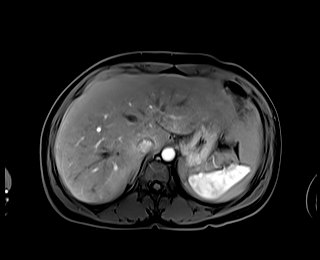
[im 80/80]
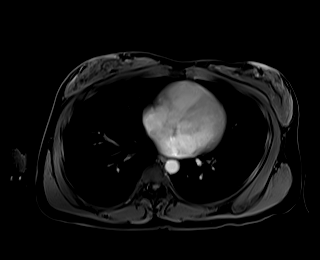

[Series 16: T1 dynamic · axial · 3.0mm · 1.25mm/px · z∈[+155,+392]mm · 4 of 80 slices shown (3 of 8)]
[im 1/80]
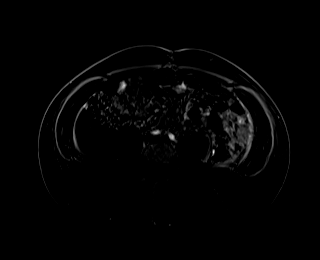
[im 27/80]
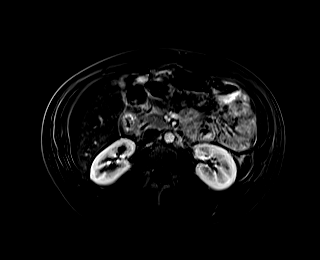
[im 53/80]
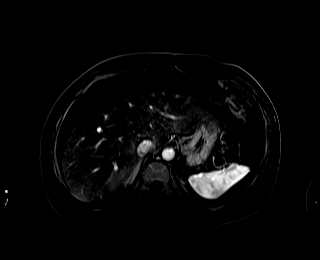
[im 80/80]
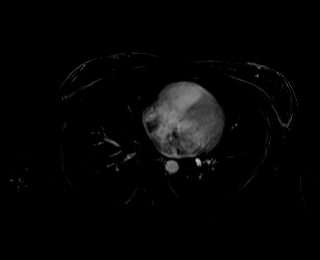

[Series 19: T1 dynamic · axial · 3.0mm · 1.25mm/px · z∈[+155,+392]mm · 4 of 80 slices shown (4 of 8)]
[im 1/80]
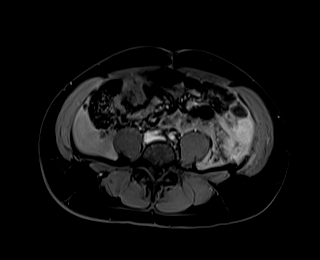
[im 27/80]
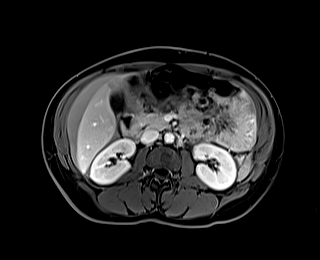
[im 53/80]
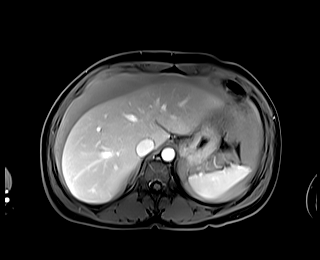
[im 80/80]
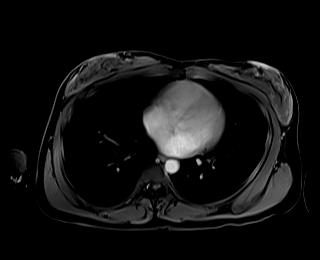

[Series 20: T1 dynamic · axial · 3.0mm · 1.25mm/px · z∈[+155,+392]mm · 4 of 80 slices shown (5 of 8)]
[im 1/80]
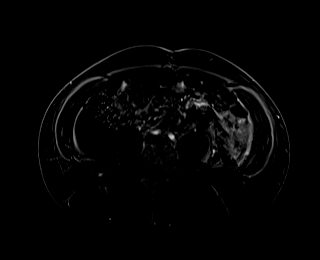
[im 27/80]
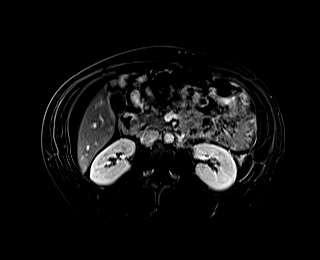
[im 53/80]
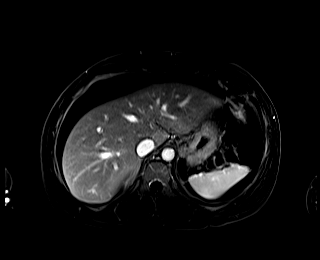
[im 80/80]
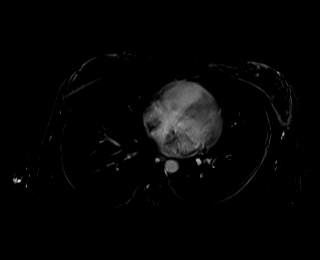

[Series 23: T1 dynamic · axial · 3.0mm · 1.25mm/px · z∈[+155,+392]mm · 4 of 80 slices shown (6 of 8)]
[im 1/80]
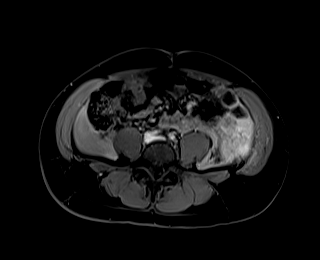
[im 27/80]
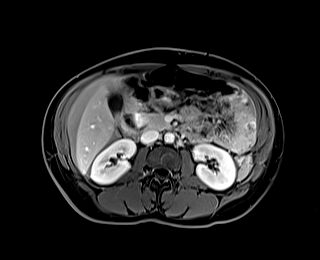
[im 53/80]
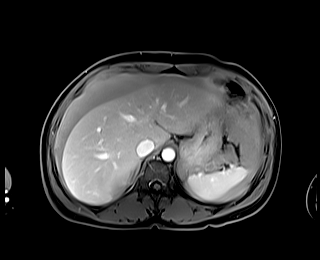
[im 80/80]
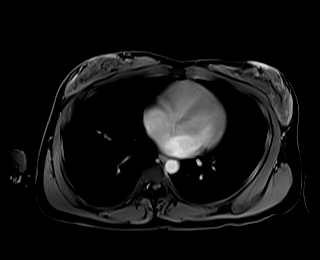

[Series 24: T1 dynamic · axial · 3.0mm · 1.25mm/px · z∈[+155,+392]mm · 4 of 80 slices shown (7 of 8)]
[im 1/80]
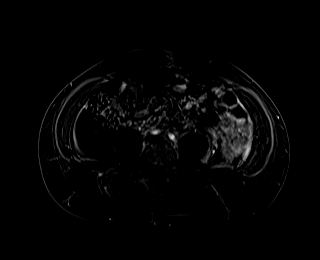
[im 27/80]
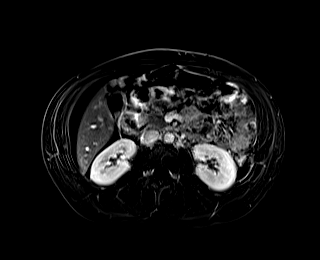
[im 53/80]
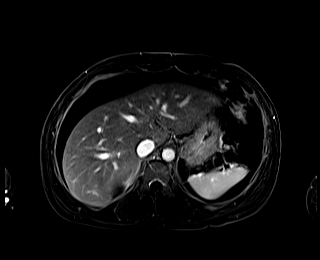
[im 80/80]
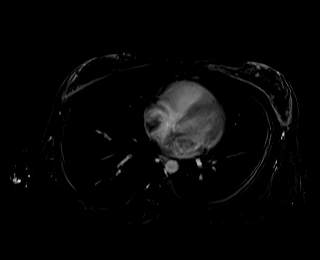

[Series 25: T2 · axial · 6.0mm · 1.56mm/px · z∈[+181,+390]mm · 2 of 30 slices shown (2 of 2)]
[im 1/30]
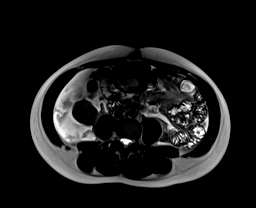
[im 30/30]
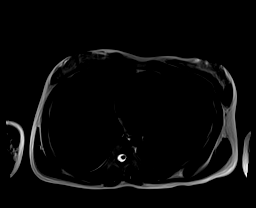

[Series 27: T1 dynamic · axial · 3.0mm · 1.25mm/px · z∈[+168,+405]mm · 4 of 80 slices shown (8 of 8)]
[im 1/80]
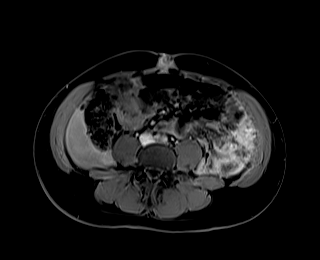
[im 27/80]
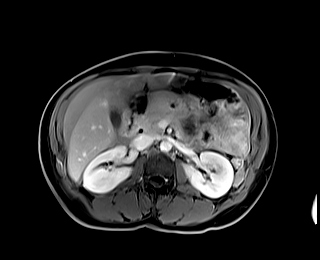
[im 53/80]
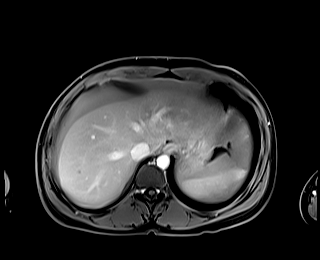
[im 80/80]
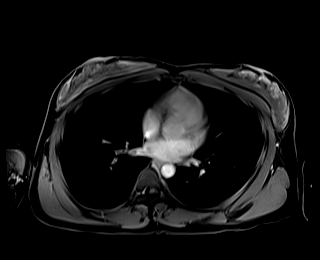

[48 of 48 positions shown; findings below may reference images not displayed]

FINDINGS: COMBINED FINDINGS FOR BOTH MR ABDOMEN AND PELVIS

Lower chest: Unremarkable.

Hepatobiliary: No suspicious cystic or solid hepatic lesions. No
intra or extrahepatic biliary ductal dilatation. Gallbladder is
normal in appearance.

Pancreas: No pancreatic mass. No pancreatic ductal dilatation. No
organized peripancreatic fluid collections or inflammatory changes.

Spleen: Small splenule inferior to the spleen. Otherwise,
unremarkable.

Adrenals/Urinary Tract: Bilateral kidneys and adrenal glands are
normal in appearance. No hydroureteronephrosis. Urinary bladder is
nearly completely decompressed, but otherwise normal in appearance.

Stomach/Bowel: Poorly evaluated by MRI, but unremarkable in
appearance.

Vascular/Lymphatic: No aneurysm identified in the abdomen or pelvis.
No lymphadenopathy noted in the abdomen or pelvis.

Reproductive: Uterus and left ovary are unremarkable in appearance.
In the superior aspect of the right ovary scratch the in or adjacent
to the superior aspect of the right ovary (axial image 17 of series
10) there is a 2.1 x 1.6 cm structure which is T1 isointense to
hypointense, mildly T2 hyperintense, and demonstrates peripheral rim
enhancement.

Other: Large volume of free fluid throughout the peritoneal cavity
which is T1 hyperintense, compatible with hemoperitoneum.

Musculoskeletal: No aggressive appearing osseous lesions are noted
in the visualized portions of the skeleton.
IMPRESSION: 1. Large volume of hemoperitoneum. Given the patient's history of
endometriosis, this is presumably related to active bleeding from an
endometrioma. Unfortunately, today's examination was protocol to
favor abdominal imaging rather than the pelvis. There is a candidate
lesion along the superior aspect of the right adnexal region, either
within or adjacent to the superior aspect of the right ovary, which
may represent a small endometrioma, however, the imaging features
are unusual and poorly evaluated given today's protocol limitations.
Repeat pelvic MRI with and without IV gadolinium could be considered
in the near future to better evaluate this finding and assess for
additional endometrial implants in the pelvis.

## 2021-07-15 MED ORDER — GADOBUTROL 1 MMOL/ML IV SOLN
7.0000 mL | Freq: Once | INTRAVENOUS | Status: AC | PRN
  Administered 2021-07-15: 7 mL via INTRAVENOUS

## 2021-07-27 ENCOUNTER — Ambulatory Visit
Admission: RE | Admit: 2021-07-27 | Discharge: 2021-07-27 | Disposition: A | Discharge status: Home / Self Care | Payer: BC Managed Care – PPO | Source: Ambulatory Visit | Attending: Obstetrics and Gynecology | Admitting: Obstetrics and Gynecology

## 2021-07-27 ENCOUNTER — Other Ambulatory Visit: Payer: Self-pay

## 2021-07-27 DIAGNOSIS — E049 Nontoxic goiter, unspecified: Secondary | ICD-10-CM

## 2021-07-27 IMAGING — US US THYROID
1 series · 14 of 25 positions shown · non-contrast
Comparison: CT chest, [DATE] and [DATE].

CLINICAL DATA: Goiter.

EXAM:
THYROID ULTRASOUND
TECHNIQUE: Ultrasound examination of the thyroid gland and adjacent soft
tissues was performed.

[Series 1: us thyroid · 0.07mm/px · 14 of 47 slices shown]
[im 1/47]
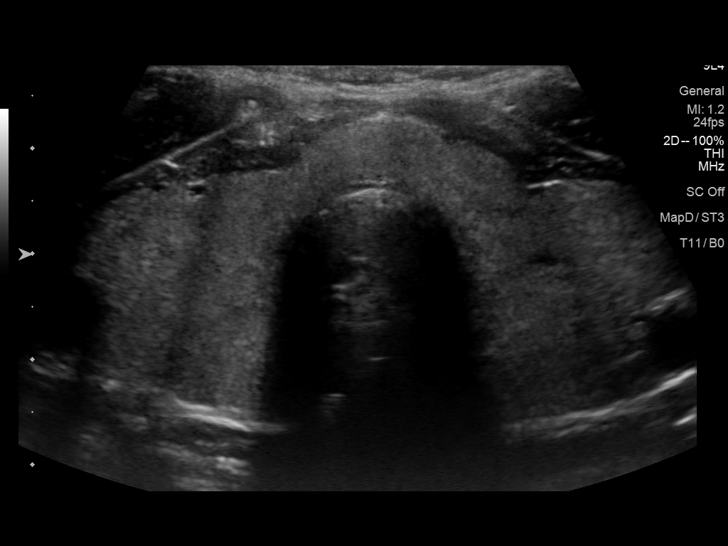
[im 4/47]
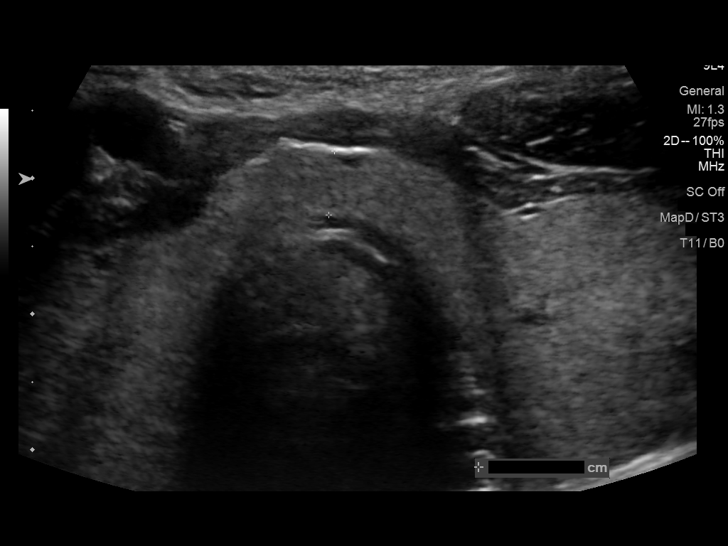
[im 8/47]
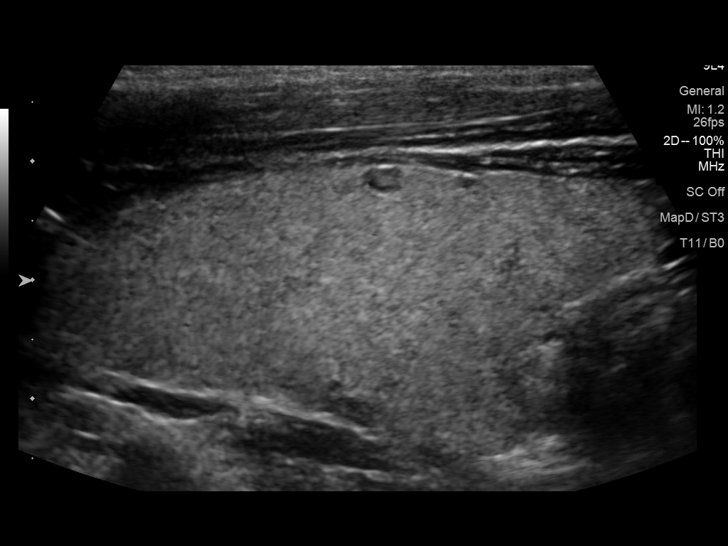
[im 12/47]
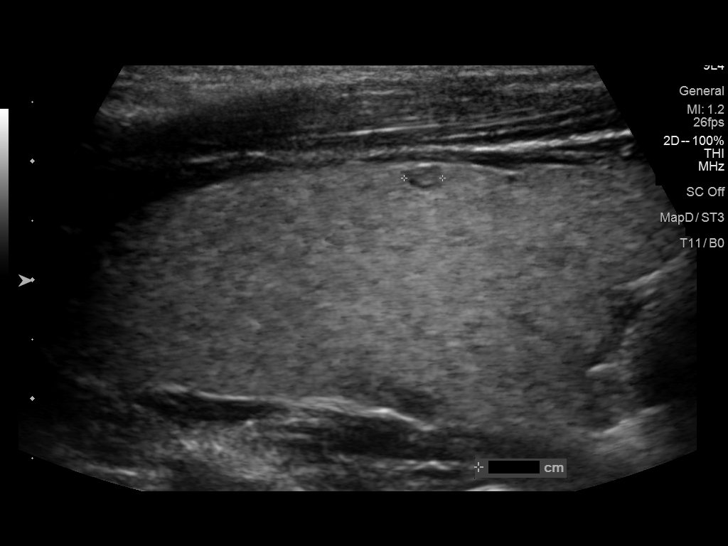
[im 16/47]
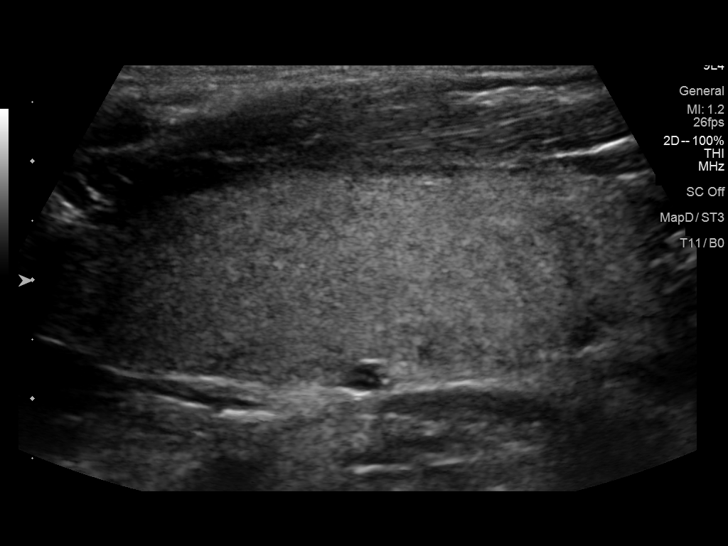
[im 18/47]
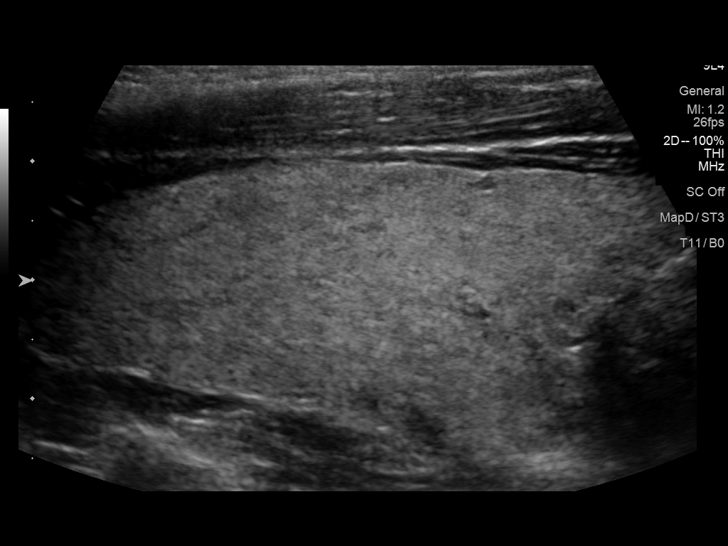
[im 22/47]
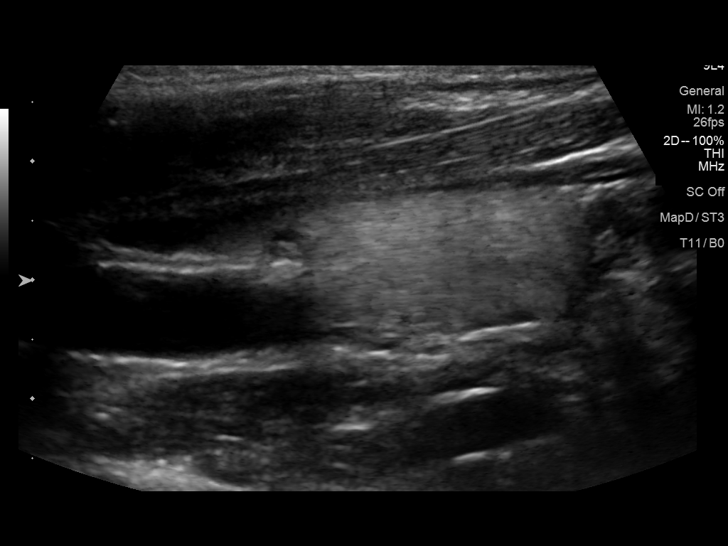
[im 25/47]
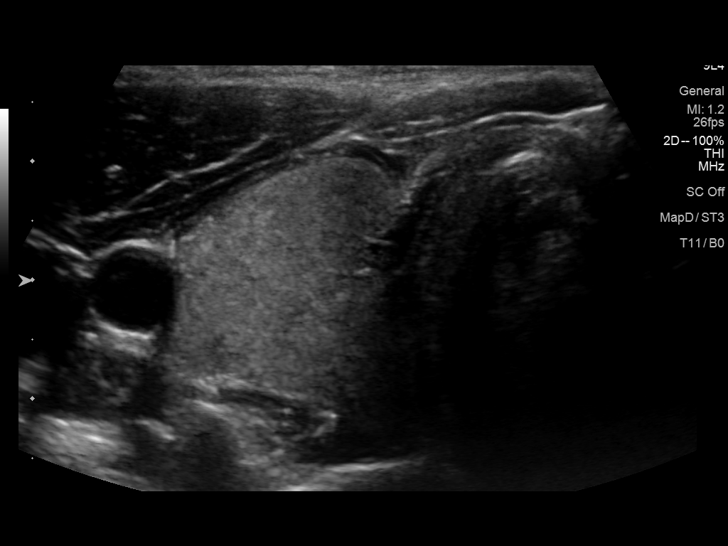
[im 29/47]
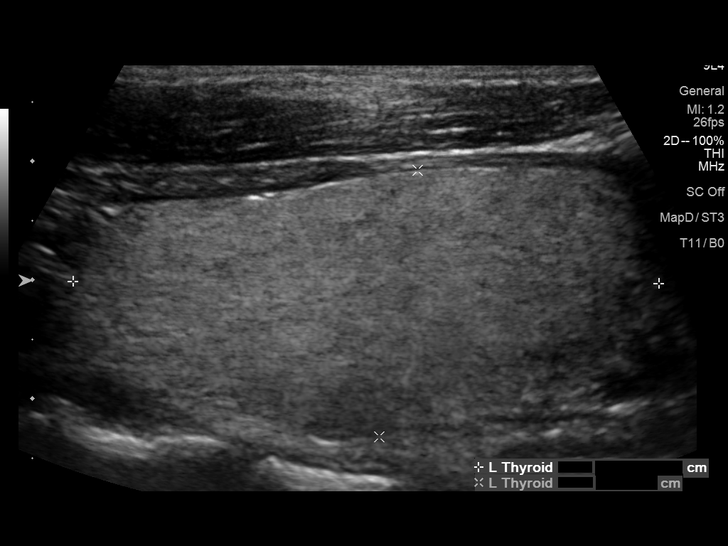
[im 31/47]
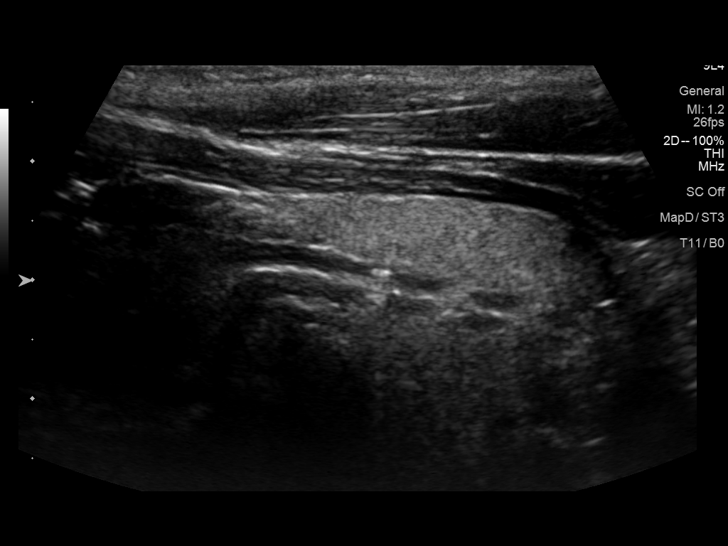
[im 35/47]
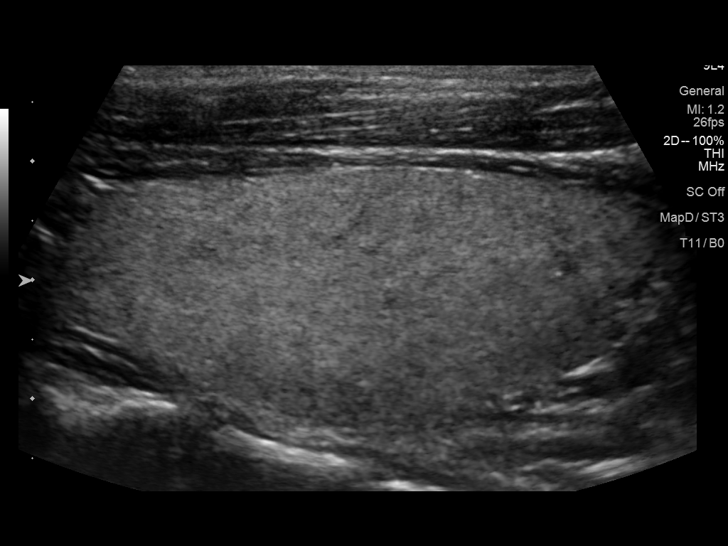
[im 39/47]
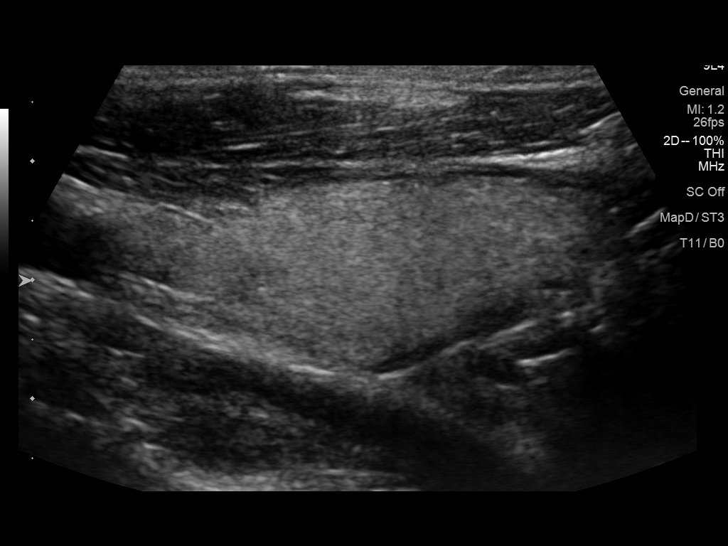
[im 43/47]
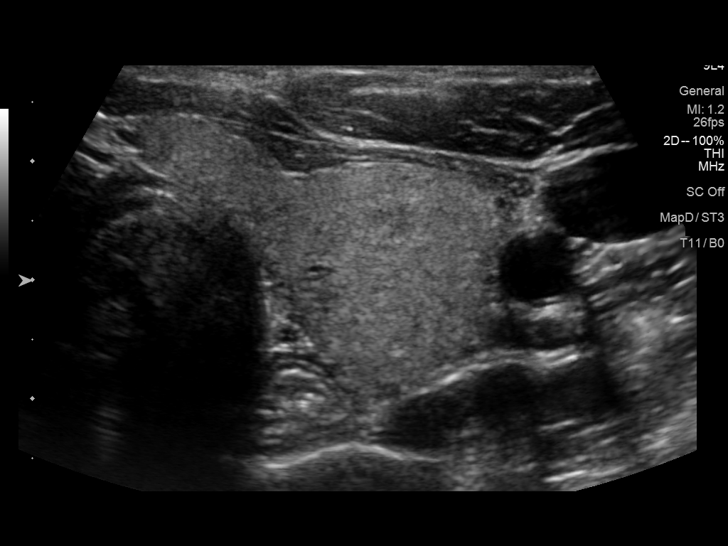
[im 47/47]
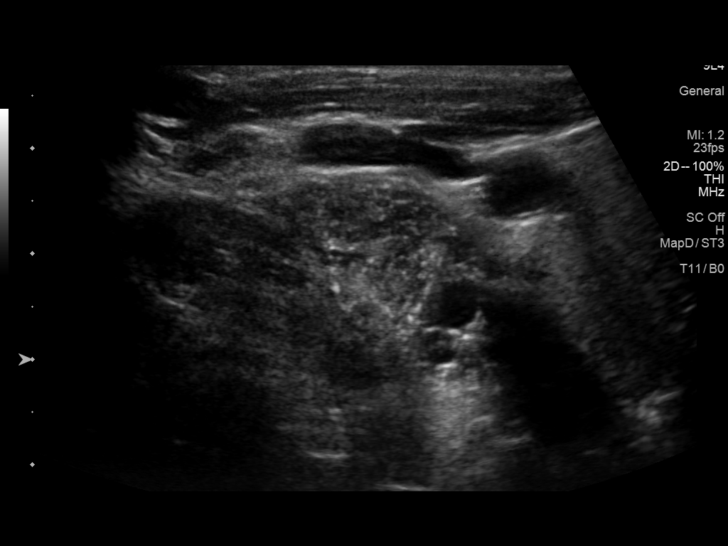

[14 of 25 positions shown; findings below may reference images not displayed]

FINDINGS: Parenchymal Echotexture: Normal

Isthmus: 0.5 cm

Right lobe: 5.3 x 2.5 x 1.7 cm

Left lobe: 4.9 x 2.3 x 2.2 cm

_________________________________________________________

Estimated total number of nodules >/= 1 cm: 0

Number of spongiform nodules >/=  2 cm not described below (TR1): 0

Number of mixed cystic and solid nodules >/= 1.5 cm not described
below (TR2): 0

_________________________________________________________

No discrete nodules are seen within the thyroid gland.

No cervical adenopathy or abnormal fluid collection within the
imaged neck.
IMPRESSION: 1. Normal thyroid gland.
2. No discrete thyroid nodule, cervical adenopathy or abnormal fluid
collection within the imaged neck.

## 2022-02-01 ENCOUNTER — Ambulatory Visit
Admission: RE | Admit: 2022-02-01 | Discharge: 2022-02-01 | Disposition: A | Discharge status: Home / Self Care | Payer: BC Managed Care – PPO | Source: Ambulatory Visit | Attending: Emergency Medicine | Admitting: Emergency Medicine

## 2022-02-01 DIAGNOSIS — R911 Solitary pulmonary nodule: Secondary | ICD-10-CM

## 2022-04-08 ENCOUNTER — Other Ambulatory Visit: Payer: Self-pay | Admitting: Obstetrics and Gynecology

## 2022-04-08 DIAGNOSIS — R188 Other ascites: Secondary | ICD-10-CM

## 2022-04-13 ENCOUNTER — Encounter: Payer: Self-pay | Admitting: Internal Medicine

## 2022-04-13 NOTE — Progress Notes (Signed)
I received labs from an organization called [facility] located on [address]. Liver function panel done 04/04/2022 showed mild elevation in ALT of 39 Chemistry done 9/20/12/2021 showed ALT of 47 Hepatitis C nonreactive collected 02/17/2022, hepatitis B surface antigen nonreactive also collected 02/17/2022 Pap smear done 07/06/2020 revealed satisfactory for evaluation, negative for intraepithelial lesion or malignancy and HPV negative.

## 2022-04-14 ENCOUNTER — Ambulatory Visit
Admission: RE | Admit: 2022-04-14 | Discharge: 2022-04-14 | Disposition: A | Discharge status: Home / Self Care | Payer: BC Managed Care – PPO | Source: Ambulatory Visit | Attending: Obstetrics and Gynecology | Admitting: Obstetrics and Gynecology

## 2022-04-14 ENCOUNTER — Other Ambulatory Visit: Payer: Self-pay | Admitting: Interventional Radiology

## 2022-04-14 ENCOUNTER — Encounter: Payer: Self-pay | Admitting: Lab

## 2022-04-14 DIAGNOSIS — R188 Other ascites: Secondary | ICD-10-CM

## 2022-04-14 HISTORY — PX: IR RADIOLOGIST EVAL & MGMT: IMG5224

## 2022-04-14 NOTE — Consult Note (Signed)
Chief Complaint:  recurrent abdominal distention and ascites  Referring Physician(s): Powell,[place]  History of Present Illness: Angelica Blankenship is a 31 y.o. female with known stage IV endometriosis status post previous diagnostic laparoscopy with drainage of ascites as well as periumbilical nodule biopsy.  In review, the patient had 2 paracenteses and 2021 in June and August.  In September 2021, she had robotic assisted laparoscopy with fulguration of endometrial implants as well as biopsy of the periumbilical nodule.  It appears that she has not had a paracentesis since 2021.  MRI in January 2023 of the abdomen and pelvis did reveal moderate heterogeneous ascites compatible with hemoperitoneum related to the known chronic endometriosis.  She presents today to discuss drainage of the ascites.   Past Medical History:  Diagnosis Date   Ascites    Endometriosis    Headache    Heart murmur    asymptomatic   Hypertension    No medications prescribed    Past Surgical History:  Procedure Laterality Date   IR PARACENTESIS  01/01/2020   LAPAROSCOPY N/A 06/18/2015   Procedure: LAPAROSCOPY DIAGNOSTIC;  Surgeon: Maxie Better, MD;  Location: [facility];  Service: Gynecology;  Laterality: N/A;  90 min. requested   LAPAROSCOPY N/A 03/19/2020   Procedure: LAPAROSCOPY DIAGNOSTIC WITH ROBOTIC ASSISTANCE AND  BIOPSIES;  Surgeon: Carver Fila, MD;  Location: [facility];  Service: Gynecology;  Laterality: N/A;   ROBOTIC ASSISTED DIAGNOSTIC LAPAROSCOPY N/A 06/18/2015   Procedure:  ROBOTIC ASSISTED DIAGNOSTIC LAPAROSCOPY WITH EXCISION OF PELVIC ENDOMETRIOSIS and lysis of adhesions, chromopertubation;  Surgeon: Maxie Better, MD;  Location: [facility];  Service: Gynecology;  Laterality: N/A;    Allergies: Patient has no known allergies.  Medications: Prior to Admission medications   Medication Sig Start Date End Date Taking? Authorizing Provider  acetaminophen (TYLENOL) 500 MG  tablet Take 1,000 mg by mouth every 6 (six) hours as needed for moderate pain.     [provider]  aspirin-acetaminophen-caffeine (EXCEDRIN MIGRAINE) [phone] MG tablet Take 1 tablet by mouth every 6 (six) hours as needed for headache.    [provider]  ibuprofen (ADVIL) 800 MG tablet Take 1 tablet (800 mg total) by mouth every 8 (eight) hours as needed for moderate pain. For AFTER surgery only 04/06/20   Warner Mccreedy D, NP  Multiple Vitamin (MULTIVITAMIN WITH MINERALS) TABS tablet Take 1 tablet by mouth daily.    [provider]  senna-docusate (SENOKOT-S) 8.6-50 MG tablet Take 2 tablets by mouth at bedtime. For AFTER surgery, do not take if having diarrhea 03/11/20   Warner Mccreedy D, NP     Family History  Problem Relation Age of Onset   Hypertension Mother    Hypertension Sister    Breast cancer Maternal Aunt    Hypertension Maternal Grandmother    Cervical cancer Maternal Grandmother    Ovarian cancer Neg Hx    Uterine cancer Neg Hx    Colon cancer Neg Hx     Social History   Socioeconomic History   Marital status: Single    Spouse name: Not on file   Number of children: 0   Years of education: Not on file   Highest education level: Not on file  Occupational History   Not on file  Tobacco Use   Smoking status: Former    Packs/day: 0.10    Years: 8.00    Total pack years: 0.80    Types: Cigars, Cigarettes    Quit date: 12/02/2016  Years since quitting: 5.3   Smokeless tobacco: Never  Vaping Use   Vaping Use: Never used  Substance and Sexual Activity   Alcohol use: Yes    Comment: occasional   Drug use: Yes    Frequency: 35.0 times per week    Types: Marijuana    Comment: uses daily   Sexual activity: Yes    Birth control/protection: None  Other Topics Concern   Not on file  Social History Narrative   Not on file   Social Determinants of Health   Financial Resource Strain: Not on file  Food Insecurity: Not on file   Transportation Needs: Not on file  Physical Activity: Not on file  Stress: Not on file  Social Connections: Not on file     Review of Systems: A 12 point ROS discussed and pertinent positives are indicated in the HPI above.  All other systems are negative.  Review of Systems  Vital Signs: BP (!) 162/103 (BP Location: Right Arm)   Pulse 81   SpO2 98%     Physical Exam Constitutional:      General: She is not in acute distress.    Appearance: She is not ill-appearing or toxic-appearing.  Eyes:     General: No scleral icterus.    Conjunctiva/sclera: Conjunctivae normal.  Cardiovascular:     Rate and Rhythm: Normal rate and regular rhythm.  Pulmonary:     Effort: Pulmonary effort is normal.     Breath sounds: Normal breath sounds.  Abdominal:     General: Bowel sounds are normal. There is distension.     Palpations: Abdomen is soft.     Tenderness: There is no abdominal tenderness.  Skin:    Coloration: Skin is not jaundiced.  Neurological:     General: No focal deficit present.     Mental Status: Mental status is at baseline.  Psychiatric:        Mood and Affect: Mood normal.        Thought Content: Thought content normal.         Imaging: No results found.  Labs:  CBC: No results for input(s): "WBC", "HGB", "HCT", "PLT" in the last 8760 hours.  COAGS: No results for input(s): "INR", "APTT" in the last 8760 hours.  BMP: No results for input(s): "NA", "K", "CL", "CO2", "GLUCOSE", "BUN", "CALCIUM", "CREATININE", "GFRNONAA", "GFRAA" in the last 8760 hours.  Invalid input(s): "CMP"  LIVER FUNCTION TESTS: No results for input(s): "BILITOT", "AST", "ALT", "ALKPHOS", "PROT", "ALBUMIN" in the last 8760 hours.  Assessment and Plan:  Stage IV endometriosis with recurrent abdominal pelvic ascites, abdominal discomfort and distention.  Paracentesis procedure reviewed with the patient in detail.  Unfortunately, she thought she could have the procedure done as  an outpatient at the imaging center today.  However, this has to be scheduled at the hospital as an outpatient.  After our discussion, she would like to proceed with scheduling the paracentesis in the next few days.  Plan: Scheduled for outpatient ultrasound paracentesis, preferably at [facility] as she is familiar with that location.     Electronically Signed: Greggory Keen 04/14/2022, 9:24 AM   I spent a total of  30 Minutes   in face to face in clinical consultation, greater than 50% of which was counseling/coordinating care for this patient with stage IV endometriosis and ascites

## 2022-04-15 ENCOUNTER — Other Ambulatory Visit: Payer: BC Managed Care – PPO

## 2022-04-30 ENCOUNTER — Emergency Department (HOSPITAL_COMMUNITY)
Admission: EM | Admit: 2022-04-30 | Discharge: 2022-04-30 | Disposition: A | Discharge status: Home / Self Care | Payer: BC Managed Care – PPO | Attending: Emergency Medicine | Admitting: Emergency Medicine

## 2022-04-30 ENCOUNTER — Other Ambulatory Visit: Payer: Self-pay

## 2022-04-30 ENCOUNTER — Encounter (HOSPITAL_COMMUNITY): Payer: Self-pay

## 2022-04-30 DIAGNOSIS — N809 Endometriosis, unspecified: Secondary | ICD-10-CM | POA: Insufficient documentation

## 2022-04-30 DIAGNOSIS — R7401 Elevation of levels of liver transaminase levels: Secondary | ICD-10-CM | POA: Diagnosis not present

## 2022-04-30 DIAGNOSIS — R188 Other ascites: Secondary | ICD-10-CM | POA: Insufficient documentation

## 2022-04-30 DIAGNOSIS — R140 Abdominal distension (gaseous): Secondary | ICD-10-CM | POA: Diagnosis present

## 2022-04-30 DIAGNOSIS — D649 Anemia, unspecified: Secondary | ICD-10-CM | POA: Insufficient documentation

## 2022-04-30 LAB — URINALYSIS, ROUTINE W REFLEX MICROSCOPIC
Bilirubin Urine: NEGATIVE
Glucose, UA: NEGATIVE mg/dL
Hgb urine dipstick: NEGATIVE
Ketones, ur: NEGATIVE mg/dL
Leukocytes,Ua: NEGATIVE
Nitrite: NEGATIVE
Protein, ur: NEGATIVE mg/dL
Specific Gravity, Urine: 1.027 (ref 1.005–1.030)
pH: 5 (ref 5.0–8.0)

## 2022-04-30 LAB — LIPASE, BLOOD: Lipase: 34 U/L (ref 11–51)

## 2022-04-30 LAB — CBC
HCT: 34.6 % — ABNORMAL LOW (ref 36.0–46.0)
Hemoglobin: 11.2 g/dL — ABNORMAL LOW (ref 12.0–15.0)
MCH: 31.7 pg (ref 26.0–34.0)
MCHC: 32.4 g/dL (ref 30.0–36.0)
MCV: 98 fL (ref 80.0–100.0)
Platelets: 378 10*3/uL (ref 150–400)
RBC: 3.53 MIL/uL — ABNORMAL LOW (ref 3.87–5.11)
RDW: 13.5 % (ref 11.5–15.5)
WBC: 5.9 10*3/uL (ref 4.0–10.5)
nRBC: 0 % (ref 0.0–0.2)

## 2022-04-30 LAB — COMPREHENSIVE METABOLIC PANEL
ALT: 47 U/L — ABNORMAL HIGH (ref 0–44)
AST: 35 U/L (ref 15–41)
Albumin: 4 g/dL (ref 3.5–5.0)
Alkaline Phosphatase: 36 U/L — ABNORMAL LOW (ref 38–126)
Anion gap: 6 (ref 5–15)
BUN: 15 mg/dL (ref 6–20)
CO2: 23 mmol/L (ref 22–32)
Calcium: 8.7 mg/dL — ABNORMAL LOW (ref 8.9–10.3)
Chloride: 112 mmol/L — ABNORMAL HIGH (ref 98–111)
Creatinine, Ser: 0.81 mg/dL (ref 0.44–1.00)
GFR, Estimated: >60 mL/min (ref >60)
Glucose, Bld: 90 mg/dL (ref 70–99)
Potassium: 4 mmol/L (ref 3.5–5.1)
Sodium: 141 mmol/L (ref 135–145)
Total Bilirubin: 0.5 mg/dL (ref 0.3–1.2)
Total Protein: 7.5 g/dL (ref 6.5–8.1)

## 2022-04-30 NOTE — Discharge Instructions (Addendum)
Dr. Dwaine Gale of on-call interventional radiology will make sure you get on the schedule for this Monday. Anticipate a phone call to schedule.

## 2022-04-30 NOTE — ED Provider Notes (Signed)
[facility] DEPT Provider Note   CSN: [id number] Arrival date & time: 04/30/22  6195     History  Chief Complaint  Patient presents with   Bloated    Angelica Blankenship is a 31 y.o. female.  HPI   31 year old female with medical history significant for stage IV endometriosis and ascites follows outpatient with OB/GYN who presents to the emergency department for coordination regarding a paracentesis.  The patient states that she saw an interventional radiologist in clinic and was told at some point she needs to have a paracentesis done at [facility].  She presents today because she thought she might be able to get a paracentesis done.  On chart review, the patient saw Dr. Reesa Chew of interventional radiology on 04/14/2022 during which time it was noted that the patient was to be scheduled scheduled for outpatient ultrasound paracentesis, preferably at [facility].  The patient states that she has not yet scheduled this paracentesis.  She denies any fevers or chills.  She states that her abdominal swelling has been present for the last several months. She denies any active pain. She feels bloated and has previously felt symptomatically improved after two prior paracenteses.  Home Medications Prior to Admission medications   Medication Sig Start Date End Date Taking? Authorizing Provider  acetaminophen (TYLENOL) 500 MG tablet Take 1,000 mg by mouth every 6 (six) hours as needed for moderate pain.     [provider]  aspirin-acetaminophen-caffeine (EXCEDRIN MIGRAINE) [phone] MG tablet Take 1 tablet by mouth every 6 (six) hours as needed for headache.    [provider]  ibuprofen (ADVIL) 800 MG tablet Take 1 tablet (800 mg total) by mouth every 8 (eight) hours as needed for moderate pain. For AFTER surgery only 04/06/20   Joylene John D, NP  Multiple Vitamin (MULTIVITAMIN WITH MINERALS) TABS tablet Take 1 tablet by mouth daily.     [provider]  senna-docusate (SENOKOT-S) 8.6-50 MG tablet Take 2 tablets by mouth at bedtime. For AFTER surgery, do not take if having diarrhea 03/11/20   Joylene John D, NP      Allergies    Patient has no known allergies.    Review of Systems   Review of Systems  Gastrointestinal:  Positive for abdominal distention.  All other systems reviewed and are negative.   Physical Exam Updated Vital Signs BP 124/86   Pulse 69   Temp 98.5 F (36.9 C) (Oral)   Resp 17   Ht 5\' 6"  (1.676 m)   Wt 71.7 kg   SpO2 100%   BMI 25.50 kg/m  Physical Exam Vitals and nursing note reviewed.  Constitutional:      General: She is not in acute distress.    Appearance: She is well-developed.  HENT:     Head: Normocephalic and atraumatic.  Eyes:     Conjunctiva/sclera: Conjunctivae normal.  Cardiovascular:     Rate and Rhythm: Normal rate and regular rhythm.  Pulmonary:     Effort: Pulmonary effort is normal. No respiratory distress.     Breath sounds: Normal breath sounds.  Abdominal:     General: There is distension.     Palpations: Abdomen is soft.     Tenderness: There is no abdominal tenderness. There is no guarding or rebound.  Musculoskeletal:        General: No swelling.     Cervical back: Neck supple.  Skin:    General: Skin is warm and dry.  Capillary Refill: Capillary refill takes less than 2 seconds.  Neurological:     Mental Status: She is alert.  Psychiatric:        Mood and Affect: Mood normal.     ED Results / Procedures / Treatments   Labs (all labs ordered are listed, but only abnormal results are displayed) Labs Reviewed  COMPREHENSIVE METABOLIC PANEL - Abnormal; Notable for the following components:      Result Value   Chloride 112 (*)    Calcium 8.7 (*)    ALT 47 (*)    Alkaline Phosphatase 36 (*)    All other components within normal limits  CBC - Abnormal; Notable for the following components:   RBC 3.53 (*)    Hemoglobin 11.2 (*)     HCT 34.6 (*)    All other components within normal limits  URINALYSIS, ROUTINE W REFLEX MICROSCOPIC - Abnormal; Notable for the following components:   APPearance HAZY (*)    All other components within normal limits  LIPASE, BLOOD    EKG None  Radiology No results found.  Procedures Procedures    Medications Ordered in ED Medications - No data to display  ED Course/ Medical Decision Making/ A&P                           Medical Decision Making Amount and/or Complexity of Data Reviewed Labs: ordered.   31 year old female with medical history significant for stage IV endometriosis and ascites follows outpatient with OB/GYN who presents to the emergency department for coordination regarding a paracentesis.  The patient states that she saw an interventional radiologist in clinic and was told at some point she needs to have a paracentesis done at [facility].  She presents today because she thought she might be able to get a paracentesis done.  On chart review, the patient saw Dr. Denny Levy of interventional radiology on 04/14/2022 during which time it was noted that the patient was to be scheduled scheduled for outpatient ultrasound paracentesis, preferably at [facility].  The patient states that she has not yet scheduled this paracentesis.  She denies any fevers or chills.  She states that her abdominal swelling has been present for the last several months. She denies any active pain. She feels bloated and has previously felt symptomatically improved after two prior paracenteses.  The patient is afebrile, nontender, with bloating present that has been there for months.  Her vitals are stable.  Laboratory evaluation generally unremarkable with mild elevated LFTs with an ALT of 47, nonspecific, UA unremarkable, lipase normal, CBC without a leukocytosis, anemia present to 11.2.  I spoke with Dr. Bryn Gulling, plan for scheduled paracentesis this Monday.  Patient informed of the plan for  outpatient paracentesis.  Stable for discharge.  The patient has been appropriately medically screened and/or stabilized in the ED. I have low suspicion for any other emergent medical condition which would require further screening, evaluation or treatment in the ED or require inpatient management.  Final Clinical Impression(s) / ED Diagnoses Final diagnoses:  Abdominal bloating  Other ascites  Endometriosis    Rx / DC Orders ED Discharge Orders     None         Ernie Avena, MD 04/30/22 1334

## 2022-04-30 NOTE — ED Triage Notes (Signed)
Patient said she has stage 4 endometriosis that has been causing her stomach to feel bloated. Has had her abdomen drained twice. Went to [facility] a couple weeks ago and they did an ultrasound that said there is fluid all over her abdomen that needs to be drained.

## 2022-05-03 ENCOUNTER — Ambulatory Visit (HOSPITAL_COMMUNITY)
Admission: RE | Admit: 2022-05-03 | Discharge: 2022-05-03 | Disposition: A | Discharge status: Home / Self Care | Payer: BC Managed Care – PPO | Source: Ambulatory Visit | Attending: Interventional Radiology | Admitting: Interventional Radiology

## 2022-05-03 DIAGNOSIS — R188 Other ascites: Secondary | ICD-10-CM | POA: Diagnosis not present

## 2022-05-03 MED ORDER — LIDOCAINE HCL 1 % IJ SOLN
INTRAMUSCULAR | Status: AC
Start: 2022-05-03 — End: 2022-05-03
  Administered 2022-05-03: 10 mL
  Filled 2022-05-03: qty 20

## 2022-05-03 NOTE — Procedures (Signed)
PROCEDURE SUMMARY:  Successful US guided paracentesis from right lower abdomen.  Yielded 3.2 L of blood-tinged fluid.  No immediate complications.  Pt tolerated well.   Specimen not sent for labs.  EBL < 2 mL  Theresa Duty, NP 05/03/2022 2:37 PM

## 2022-08-25 DIAGNOSIS — E049 Nontoxic goiter, unspecified: Secondary | ICD-10-CM | POA: Insufficient documentation

## 2022-12-03 ENCOUNTER — Other Ambulatory Visit: Payer: Self-pay

## 2022-12-03 ENCOUNTER — Emergency Department (HOSPITAL_COMMUNITY): Payer: BC Managed Care – PPO

## 2022-12-03 ENCOUNTER — Emergency Department (HOSPITAL_COMMUNITY)
Admission: EM | Admit: 2022-12-03 | Discharge: 2022-12-03 | Disposition: A | Discharge status: Home / Self Care | Payer: BC Managed Care – PPO | Source: Home / Self Care | Attending: Emergency Medicine | Admitting: Emergency Medicine

## 2022-12-03 ENCOUNTER — Encounter: Payer: Self-pay | Admitting: Gynecologic Oncology

## 2022-12-03 DIAGNOSIS — K652 Spontaneous bacterial peritonitis: Secondary | ICD-10-CM | POA: Diagnosis not present

## 2022-12-03 DIAGNOSIS — R188 Other ascites: Secondary | ICD-10-CM

## 2022-12-03 DIAGNOSIS — R197 Diarrhea, unspecified: Secondary | ICD-10-CM | POA: Insufficient documentation

## 2022-12-03 DIAGNOSIS — R112 Nausea with vomiting, unspecified: Secondary | ICD-10-CM | POA: Insufficient documentation

## 2022-12-03 DIAGNOSIS — A419 Sepsis, unspecified organism: Secondary | ICD-10-CM | POA: Diagnosis not present

## 2022-12-03 LAB — I-STAT CHEM 8, ED
BUN: 7 mg/dL (ref 6–20)
Calcium, Ion: 1.14 mmol/L — ABNORMAL LOW (ref 1.15–1.40)
Chloride: 108 mmol/L (ref 98–111)
Creatinine, Ser: 0.8 mg/dL (ref 0.44–1.00)
Glucose, Bld: 119 mg/dL — ABNORMAL HIGH (ref 70–99)
HCT: 38 % (ref 36.0–46.0)
Hemoglobin: 12.9 g/dL (ref 12.0–15.0)
Potassium: 3.3 mmol/L — ABNORMAL LOW (ref 3.5–5.1)
Sodium: 139 mmol/L (ref 135–145)
TCO2: 19 mmol/L — ABNORMAL LOW (ref 22–32)

## 2022-12-03 LAB — CBC WITH DIFFERENTIAL/PLATELET
Abs Immature Granulocytes: 0.03 10*3/uL (ref 0.00–0.07)
Basophils Absolute: 0 10*3/uL (ref 0.0–0.1)
Basophils Relative: 0 %
Eosinophils Absolute: 0 10*3/uL (ref 0.0–0.5)
Eosinophils Relative: 0 %
HCT: 36.4 % (ref 36.0–46.0)
Hemoglobin: 12.2 g/dL (ref 12.0–15.0)
Immature Granulocytes: 0 %
Lymphocytes Relative: 4 %
Lymphs Abs: 0.4 10*3/uL — ABNORMAL LOW (ref 0.7–4.0)
MCH: 31.9 pg (ref 26.0–34.0)
MCHC: 33.5 g/dL (ref 30.0–36.0)
MCV: 95 fL (ref 80.0–100.0)
Monocytes Absolute: 0.2 10*3/uL (ref 0.1–1.0)
Monocytes Relative: 2 %
Neutro Abs: 8.6 10*3/uL — ABNORMAL HIGH (ref 1.7–7.7)
Neutrophils Relative %: 94 %
Platelets: 326 10*3/uL (ref 150–400)
RBC: 3.83 MIL/uL — ABNORMAL LOW (ref 3.87–5.11)
RDW: 14.3 % (ref 11.5–15.5)
WBC: 9.2 10*3/uL (ref 4.0–10.5)
nRBC: 0 % (ref 0.0–0.2)

## 2022-12-03 LAB — COMPREHENSIVE METABOLIC PANEL
ALT: 18 U/L (ref 0–44)
AST: 16 U/L (ref 15–41)
Albumin: 3.7 g/dL (ref 3.5–5.0)
Alkaline Phosphatase: 39 U/L (ref 38–126)
Anion gap: 10 (ref 5–15)
BUN: 9 mg/dL (ref 6–20)
CO2: 16 mmol/L — ABNORMAL LOW (ref 22–32)
Calcium: 8.7 mg/dL — ABNORMAL LOW (ref 8.9–10.3)
Chloride: 108 mmol/L (ref 98–111)
Creatinine, Ser: 0.75 mg/dL (ref 0.44–1.00)
GFR, Estimated: >60 mL/min (ref >60)
Glucose, Bld: 114 mg/dL — ABNORMAL HIGH (ref 70–99)
Potassium: 3.3 mmol/L — ABNORMAL LOW (ref 3.5–5.1)
Sodium: 134 mmol/L — ABNORMAL LOW (ref 135–145)
Total Bilirubin: 0.6 mg/dL (ref 0.3–1.2)
Total Protein: 8 g/dL (ref 6.5–8.1)

## 2022-12-03 LAB — I-STAT BETA HCG BLOOD, ED (MC, WL, AP ONLY): I-stat hCG, quantitative: <5 m[IU]/mL (ref <5)

## 2022-12-03 LAB — LIPASE, BLOOD: Lipase: 27 U/L (ref 11–51)

## 2022-12-03 MED ORDER — HYDROMORPHONE HCL 1 MG/ML IJ SOLN
1.0000 mg | Freq: Once | INTRAMUSCULAR | Status: AC
  Administered 2022-12-03: 1 mg via INTRAVENOUS
  Filled 2022-12-03: qty 1

## 2022-12-03 MED ORDER — KETOROLAC TROMETHAMINE 30 MG/ML IJ SOLN
30.0000 mg | Freq: Once | INTRAMUSCULAR | Status: AC
  Administered 2022-12-03: 30 mg via INTRAVENOUS
  Filled 2022-12-03: qty 1

## 2022-12-03 MED ORDER — MORPHINE SULFATE (PF) 4 MG/ML IV SOLN
4.0000 mg | Freq: Once | INTRAVENOUS | Status: AC
  Administered 2022-12-03: 4 mg via INTRAVENOUS
  Filled 2022-12-03: qty 1

## 2022-12-03 MED ORDER — ONDANSETRON HCL 4 MG/2ML IJ SOLN
4.0000 mg | Freq: Once | INTRAMUSCULAR | Status: AC
  Administered 2022-12-03: 4 mg via INTRAVENOUS
  Filled 2022-12-03: qty 2

## 2022-12-03 MED ORDER — IOHEXOL 300 MG/ML  SOLN
100.0000 mL | Freq: Once | INTRAMUSCULAR | Status: AC | PRN
  Administered 2022-12-03: 100 mL via INTRAVENOUS

## 2022-12-03 MED ORDER — HYDROCODONE-ACETAMINOPHEN 5-325 MG PO TABS: 2.0000 | ORAL_TABLET | ORAL | 0 refills | Status: AC | PRN

## 2022-12-03 MED ORDER — SODIUM CHLORIDE 0.9 % IV BOLUS
1000.0000 mL | Freq: Once | INTRAVENOUS | Status: AC
  Administered 2022-12-03: 1000 mL via INTRAVENOUS

## 2022-12-03 NOTE — Discharge Instructions (Addendum)
Evaluation for your abdominal pain was overall reassuring.  CT did reveal free fluid in your abdomen but appears stable in comparison to your MRI on May 7.  Recommend you do follow-up with interventional radiology to schedule appointment to drain the fluid and follow-up with your OB/GYN provider.  This is likely a secondary process related to endometriosis.  If you have worsening abdominal pain, fever, intractable nausea vomiting or diarrhea or any other concerning symptom please return emerged part for evaluation.  Sent Norco to your pharmacy for acute pain in the next couple days.

## 2022-12-03 NOTE — ED Triage Notes (Signed)
Pt reports lower abd pain, n/v/d x 2 days.

## 2022-12-03 NOTE — ED Provider Notes (Signed)
[facility] EMERGENCY DEPARTMENT AT [facility] Provider Note   CSN: [id number] Arrival date & time: 12/03/22  1151     History  Chief Complaint  Patient presents with   Abdominal Pain   HPI Angelica Blankenship is a 32 y.o. female with endometriosis presenting for abdominal pain.  Started 2 days ago.  Located in the all across the lower abdomen.  Endorses nausea vomiting diarrhea.  States she only vomited once on Thursday but has not since.  Still experiencing persistent diarrhea.  Output is nonbloody.  States she smoked marijuana this morning and usually does so daily.  Denies abnormal vaginal bleeding or discharge.  Also endorses dysuria.  Denies fever.   Abdominal Pain      Home Medications Prior to Admission medications   Medication Sig Start Date End Date Taking? Authorizing Provider  acetaminophen (TYLENOL) 500 MG tablet Take 1,000 mg by mouth every 6 (six) hours as needed for moderate pain.     [provider]  aspirin-acetaminophen-caffeine (EXCEDRIN MIGRAINE) [phone] MG tablet Take 1 tablet by mouth every 6 (six) hours as needed for headache.    [provider]  ibuprofen (ADVIL) 800 MG tablet Take 1 tablet (800 mg total) by mouth every 8 (eight) hours as needed for moderate pain. For AFTER surgery only 04/06/20   Warner Mccreedy D, NP  Multiple Vitamin (MULTIVITAMIN WITH MINERALS) TABS tablet Take 1 tablet by mouth daily.    [provider]  senna-docusate (SENOKOT-S) 8.6-50 MG tablet Take 2 tablets by mouth at bedtime. For AFTER surgery, do not take if having diarrhea 03/11/20   Warner Mccreedy D, NP      Allergies    Patient has no known allergies.    Review of Systems   Review of Systems  Gastrointestinal:  Positive for abdominal pain.    Physical Exam   Vitals:   12/03/22 1158 12/03/22 1300  BP: (!) 151/95 (!) 146/97  Pulse: (!) 112 100  Resp: 18 20  Temp: 98.6 F (37 C)   SpO2: 99% 99%    CONSTITUTIONAL:   well-appearing, NAD NEURO:  Alert and oriented x 3, CN 3-12 grossly intact EYES:  eyes equal and reactive ENT/NECK:  Supple, no stridor  CARDIO:  regular Rate and rhythm, appears well-perfused  PULM:  No respiratory distress, CTAB GI/GU:  non-distended, soft, tender lower abdomen  MSK/SPINE:  No gross deformities, no edema, moves all extremities  SKIN:  no rash, atraumatic  *Additional and/or pertinent findings included in MDM below   ED Results / Procedures / Treatments   Labs (all labs ordered are listed, but only abnormal results are displayed) Labs Reviewed  CBC WITH DIFFERENTIAL/PLATELET - Abnormal; Notable for the following components:      Result Value   RBC 3.83 (*)    Neutro Abs 8.6 (*)    Lymphs Abs 0.4 (*)    All other components within normal limits  COMPREHENSIVE METABOLIC PANEL - Abnormal; Notable for the following components:   Sodium 134 (*)    Potassium 3.3 (*)    CO2 16 (*)    Glucose, Bld 114 (*)    Calcium 8.7 (*)    All other components within normal limits  I-STAT CHEM 8, ED - Abnormal; Notable for the following components:   Potassium 3.3 (*)    Glucose, Bld 119 (*)    Calcium, Ion 1.14 (*)    TCO2 19 (*)    All other components within normal limits  LIPASE, BLOOD  URINALYSIS, ROUTINE W REFLEX MICROSCOPIC  PREGNANCY, URINE  I-STAT BETA HCG BLOOD, ED ([facility], [facility], [facility] ONLY)    EKG None  Radiology CT ABDOMEN PELVIS W CONTRAST  Result Date: 12/03/2022 CLINICAL DATA:  Abdominal pain. History of endometriosis and recurrent ascites EXAM: CT ABDOMEN AND PELVIS WITH CONTRAST TECHNIQUE: Multidetector CT imaging of the abdomen and pelvis was performed using the standard protocol following bolus administration of intravenous contrast. RADIATION DOSE REDUCTION: This exam was performed according to the departmental dose-optimization program which includes automated exposure control, adjustment of the mA and/or kV according to patient size and/or use of iterative  reconstruction technique. CONTRAST:  OMNIPAQUE IOHEXOL 300 MG/ML  SOLN COMPARISON:  02/28/2020, 07/15/2021 FINDINGS: Lower chest: Linear bibasilar atelectasis.  Heart size is normal. Hepatobiliary: No focal liver abnormality is seen. No gallstones, gallbladder wall thickening, or biliary dilatation. Pancreas: Unremarkable. No pancreatic ductal dilatation or surrounding inflammatory changes. Spleen: Normal in size without focal abnormality. Adrenals/Urinary Tract: Unremarkable adrenal glands. Kidneys enhance symmetrically without focal lesion, stone, or hydronephrosis. Ureters are nondilated. Urinary bladder is decompressed. Stomach/Bowel: Stomach is within normal limits. Appendix not definitively seen. No evidence of bowel wall thickening, distention, or inflammatory changes. Vascular/Lymphatic: No significant vascular findings are present. No enlarged abdominal or pelvic lymph nodes. Reproductive: Unremarkable uterus. Evaluation of the adnexal regions is limited given the degree of surrounding ascites. Other: Large volume ascites throughout the abdomen measuring slightly greater than simple fluid. No free air. No abdominal wall hernia. Musculoskeletal: Soft tissue thickening in the region of the umbilicus is unchanged from prior and may be related to prior laparoscopy changes. No acute or significant osseous findings. Mild scoliotic thoracolumbar curvature. IMPRESSION: 1. Recurrent large volume ascites throughout the abdomen measuring slightly greater than simple fluid. Findings are nonspecific and may be related to patient's history of endometriosis. 2. Otherwise, no acute abdominopelvic findings. Electronically Signed   By: Duanne Guess D.O.   On: 12/03/2022 13:53    Procedures Procedures    Medications Ordered in ED Medications  sodium chloride 0.9 % bolus 1,000 mL (1,000 mLs Intravenous New Bag/Given 12/03/22 1226)  ondansetron (ZOFRAN) injection 4 mg (4 mg Intravenous Given 12/03/22 1227)   morphine (PF) 4 MG/ML injection 4 mg (4 mg Intravenous Given 12/03/22 1226)  ketorolac (TORADOL) 30 MG/ML injection 30 mg (30 mg Intravenous Given 12/03/22 1308)  HYDROmorphone (DILAUDID) injection 1 mg (1 mg Intravenous Given 12/03/22 1308)  iohexol (OMNIPAQUE) 300 MG/ML solution 100 mL (100 mLs Intravenous Contrast Given 12/03/22 1341)    ED Course/ Medical Decision Making/ A&P                             Medical Decision Making Amount and/or Complexity of Data Reviewed Labs: ordered. Radiology: ordered.  Risk Prescription drug management.   Initial Impression and Ddx 32 year old well-appearing female presenting for abdominal pain.  Exam notable for lower abdominal tenderness.  DDx includes ectopic pregnancy, ovarian torsion, appendicitis, diverticulitis, endometriosis.  Patient PMH that increases complexity of ED encounter: endometriosis  Interpretation of Diagnostics - I independent reviewed and interpreted the labs as followed: Hypokalemia  - I independently visualized the following imaging with scope of interpretation limited to determining acute life threatening conditions related to emergency care: CT abdomen/pelvis, which revealed large volume ascites  - Also reviewed recent MRI on May 7 which revealed large volume ascites and findings compatible with deep infiltrating endometriosis  Patient Reassessment and Ultimate Disposition/Management  After treatment, patient states she felt much better.  After reviewing her chart, noticed that she does have a referral in place to drain the fluid in her abdomen.  Diabetes appears to be stable in comparison to her MRI on May 7.  This is likely a chronic process related to her endometriosis.  She does have close follow-up with with her OB/GYN provider.  Advised her to follow-up and go to her IR appointment when scheduled.  Discussed return precautions.  Sent Norco to her pharmacy for acute pain.  Vital stable at discharge.  Discharged  home.  Patient management required discussion with the following services or consulting groups:  None  Complexity of Problems Addressed Acute complicated illness or Injury  Additional Data Reviewed and Analyzed Further history obtained from: Further history from spouse/family member, Past medical history and medications listed in the EMR, and Prior ED visit notes  Patient Encounter Risk Assessment Prescriptions         Final Clinical Impression(s) / ED Diagnoses Final diagnoses:  Other ascites    Rx / DC Orders ED Discharge Orders     None         Gareth Eagle, PA-C 12/09/22 1148    Wynetta Fines, MD 12/10/22 1208

## 2022-12-04 ENCOUNTER — Encounter (HOSPITAL_COMMUNITY): Payer: Self-pay

## 2022-12-04 ENCOUNTER — Inpatient Hospital Stay (HOSPITAL_COMMUNITY)
Admission: EM | Admit: 2022-12-04 | Discharge: 2022-12-12 | DRG: 372 | Disposition: A | Discharge status: Other Acute Inpatient Hospital | Payer: BC Managed Care – PPO | Attending: Family Medicine | Admitting: Family Medicine

## 2022-12-04 DIAGNOSIS — R809 Proteinuria, unspecified: Secondary | ICD-10-CM | POA: Diagnosis present

## 2022-12-04 DIAGNOSIS — Z8249 Family history of ischemic heart disease and other diseases of the circulatory system: Secondary | ICD-10-CM | POA: Diagnosis not present

## 2022-12-04 DIAGNOSIS — L299 Pruritus, unspecified: Secondary | ICD-10-CM | POA: Diagnosis not present

## 2022-12-04 DIAGNOSIS — K5900 Constipation, unspecified: Secondary | ICD-10-CM | POA: Diagnosis not present

## 2022-12-04 DIAGNOSIS — Y92239 Unspecified place in hospital as the place of occurrence of the external cause: Secondary | ICD-10-CM | POA: Diagnosis present

## 2022-12-04 DIAGNOSIS — B9562 Methicillin resistant Staphylococcus aureus infection as the cause of diseases classified elsewhere: Secondary | ICD-10-CM | POA: Diagnosis present

## 2022-12-04 DIAGNOSIS — R7881 Bacteremia: Secondary | ICD-10-CM | POA: Diagnosis not present

## 2022-12-04 DIAGNOSIS — Z8049 Family history of malignant neoplasm of other genital organs: Secondary | ICD-10-CM

## 2022-12-04 DIAGNOSIS — D649 Anemia, unspecified: Secondary | ICD-10-CM | POA: Diagnosis present

## 2022-12-04 DIAGNOSIS — R911 Solitary pulmonary nodule: Secondary | ICD-10-CM | POA: Diagnosis present

## 2022-12-04 DIAGNOSIS — G8929 Other chronic pain: Secondary | ICD-10-CM | POA: Diagnosis present

## 2022-12-04 DIAGNOSIS — Z79899 Other long term (current) drug therapy: Secondary | ICD-10-CM | POA: Diagnosis not present

## 2022-12-04 DIAGNOSIS — R319 Hematuria, unspecified: Secondary | ICD-10-CM | POA: Diagnosis present

## 2022-12-04 DIAGNOSIS — N809 Endometriosis, unspecified: Secondary | ICD-10-CM | POA: Diagnosis present

## 2022-12-04 DIAGNOSIS — D72819 Decreased white blood cell count, unspecified: Secondary | ICD-10-CM | POA: Diagnosis present

## 2022-12-04 DIAGNOSIS — E860 Dehydration: Secondary | ICD-10-CM | POA: Diagnosis present

## 2022-12-04 DIAGNOSIS — M546 Pain in thoracic spine: Secondary | ICD-10-CM | POA: Diagnosis present

## 2022-12-04 DIAGNOSIS — Z87891 Personal history of nicotine dependence: Secondary | ICD-10-CM

## 2022-12-04 DIAGNOSIS — E8770 Fluid overload, unspecified: Secondary | ICD-10-CM | POA: Diagnosis not present

## 2022-12-04 DIAGNOSIS — I10 Essential (primary) hypertension: Secondary | ICD-10-CM | POA: Diagnosis present

## 2022-12-04 DIAGNOSIS — T508X5A Adverse effect of diagnostic agents, initial encounter: Secondary | ICD-10-CM | POA: Diagnosis present

## 2022-12-04 DIAGNOSIS — D696 Thrombocytopenia, unspecified: Secondary | ICD-10-CM | POA: Diagnosis not present

## 2022-12-04 DIAGNOSIS — A419 Sepsis, unspecified organism: Secondary | ICD-10-CM | POA: Diagnosis present

## 2022-12-04 DIAGNOSIS — R188 Other ascites: Secondary | ICD-10-CM | POA: Diagnosis present

## 2022-12-04 DIAGNOSIS — Z803 Family history of malignant neoplasm of breast: Secondary | ICD-10-CM

## 2022-12-04 DIAGNOSIS — Z515 Encounter for palliative care: Secondary | ICD-10-CM | POA: Diagnosis not present

## 2022-12-04 DIAGNOSIS — M62838 Other muscle spasm: Secondary | ICD-10-CM | POA: Diagnosis present

## 2022-12-04 DIAGNOSIS — K652 Spontaneous bacterial peritonitis: Secondary | ICD-10-CM | POA: Diagnosis present

## 2022-12-04 DIAGNOSIS — N179 Acute kidney failure, unspecified: Secondary | ICD-10-CM | POA: Diagnosis present

## 2022-12-04 DIAGNOSIS — E872 Acidosis, unspecified: Secondary | ICD-10-CM | POA: Diagnosis present

## 2022-12-04 DIAGNOSIS — E876 Hypokalemia: Secondary | ICD-10-CM | POA: Diagnosis present

## 2022-12-04 DIAGNOSIS — R6520 Severe sepsis without septic shock: Secondary | ICD-10-CM | POA: Diagnosis not present

## 2022-12-04 DIAGNOSIS — M7989 Other specified soft tissue disorders: Secondary | ICD-10-CM | POA: Diagnosis not present

## 2022-12-04 DIAGNOSIS — A4102 Sepsis due to Methicillin resistant Staphylococcus aureus: Secondary | ICD-10-CM | POA: Diagnosis not present

## 2022-12-04 LAB — CBC WITH DIFFERENTIAL/PLATELET
Abs Immature Granulocytes: 0.01 10*3/uL (ref 0.00–0.07)
Basophils Absolute: 0 10*3/uL (ref 0.0–0.1)
Basophils Relative: 0 %
Eosinophils Absolute: 0 10*3/uL (ref 0.0–0.5)
Eosinophils Relative: 0 %
HCT: 34.3 % — ABNORMAL LOW (ref 36.0–46.0)
Hemoglobin: 11.5 g/dL — ABNORMAL LOW (ref 12.0–15.0)
Immature Granulocytes: 0 %
Lymphocytes Relative: 5 %
Lymphs Abs: 0.3 10*3/uL — ABNORMAL LOW (ref 0.7–4.0)
MCH: 31.6 pg (ref 26.0–34.0)
MCHC: 33.5 g/dL (ref 30.0–36.0)
MCV: 94.2 fL (ref 80.0–100.0)
Monocytes Absolute: 0.2 10*3/uL (ref 0.1–1.0)
Monocytes Relative: 4 %
Neutro Abs: 4.3 10*3/uL (ref 1.7–7.7)
Neutrophils Relative %: 91 %
Platelets: 314 10*3/uL (ref 150–400)
RBC: 3.64 MIL/uL — ABNORMAL LOW (ref 3.87–5.11)
RDW: 14.3 % (ref 11.5–15.5)
WBC: 4.8 10*3/uL (ref 4.0–10.5)
nRBC: 0 % (ref 0.0–0.2)

## 2022-12-04 LAB — URINALYSIS, W/ REFLEX TO CULTURE (INFECTION SUSPECTED)
Bacteria, UA: NONE SEEN
Bilirubin Urine: NEGATIVE
Glucose, UA: NEGATIVE mg/dL
Ketones, ur: 20 mg/dL — AB
Leukocytes,Ua: NEGATIVE
Nitrite: NEGATIVE
Protein, ur: >=300 mg/dL — AB
Specific Gravity, Urine: >1.046 — ABNORMAL HIGH (ref 1.005–1.030)
pH: 5 (ref 5.0–8.0)

## 2022-12-04 LAB — GLUCOSE, PLEURAL OR PERITONEAL FLUID: Glucose, Fluid: <20 mg/dL

## 2022-12-04 LAB — COMPREHENSIVE METABOLIC PANEL
ALT: 14 U/L (ref 0–44)
AST: 14 U/L — ABNORMAL LOW (ref 15–41)
Albumin: 3.5 g/dL (ref 3.5–5.0)
Alkaline Phosphatase: 31 U/L — ABNORMAL LOW (ref 38–126)
Anion gap: 11 (ref 5–15)
BUN: 15 mg/dL (ref 6–20)
CO2: 17 mmol/L — ABNORMAL LOW (ref 22–32)
Calcium: 8.6 mg/dL — ABNORMAL LOW (ref 8.9–10.3)
Chloride: 107 mmol/L (ref 98–111)
Creatinine, Ser: 0.89 mg/dL (ref 0.44–1.00)
GFR, Estimated: >60 mL/min (ref >60)
Glucose, Bld: 127 mg/dL — ABNORMAL HIGH (ref 70–99)
Potassium: 3.3 mmol/L — ABNORMAL LOW (ref 3.5–5.1)
Sodium: 135 mmol/L (ref 135–145)
Total Bilirubin: 0.5 mg/dL (ref 0.3–1.2)
Total Protein: 7.9 g/dL (ref 6.5–8.1)

## 2022-12-04 LAB — PROTEIN, PLEURAL OR PERITONEAL FLUID: Total protein, fluid: 3.9 g/dL

## 2022-12-04 LAB — ALBUMIN, PLEURAL OR PERITONEAL FLUID: Albumin, Fluid: 2.7 g/dL

## 2022-12-04 LAB — MAGNESIUM: Magnesium: 1.6 mg/dL — ABNORMAL LOW (ref 1.7–2.4)

## 2022-12-04 LAB — LACTIC ACID, PLASMA
Lactic Acid, Venous: 1.7 mmol/L (ref 0.5–1.9)
Lactic Acid, Venous: 1.5 mmol/L (ref 0.5–1.9)

## 2022-12-04 LAB — HCG, QUANTITATIVE, PREGNANCY: hCG, Beta Chain, Quant, S: <1 m[IU]/mL (ref <5)

## 2022-12-04 LAB — LIPASE, BLOOD: Lipase: 23 U/L (ref 11–51)

## 2022-12-04 LAB — LACTATE DEHYDROGENASE, PLEURAL OR PERITONEAL FLUID: LD, Fluid: 1245 U/L — ABNORMAL HIGH (ref 3–23)

## 2022-12-04 LAB — BODY FLUID CULTURE W GRAM STAIN

## 2022-12-04 LAB — BODY FLUID CELL COUNT WITH DIFFERENTIAL
Eos, Fluid: 0 %
Lymphs, Fluid: 0 %
Monocyte-Macrophage-Serous Fluid: 3 % — ABNORMAL LOW (ref 50–90)
Neutrophil Count, Fluid: 97 % — ABNORMAL HIGH (ref 0–25)
Total Nucleated Cell Count, Fluid: 7673 cu mm — ABNORMAL HIGH (ref 0–1000)

## 2022-12-04 LAB — PHOSPHORUS: Phosphorus: 1 mg/dL — CL (ref 2.5–4.6)

## 2022-12-04 MED ORDER — POTASSIUM PHOSPHATES 15 MMOLE/5ML IV SOLN
30.0000 mmol | Freq: Once | INTRAVENOUS | Status: AC
  Administered 2022-12-04: 30 mmol via INTRAVENOUS
  Filled 2022-12-04: qty 10

## 2022-12-04 MED ORDER — SODIUM CHLORIDE 0.9 % IV SOLN
2.0000 g | INTRAVENOUS | Status: DC
  Administered 2022-12-04 – 2022-12-05 (×2): 2 g via INTRAVENOUS
  Filled 2022-12-04 (×2): qty 20

## 2022-12-04 MED ORDER — KETOROLAC TROMETHAMINE 30 MG/ML IJ SOLN
30.0000 mg | Freq: Four times a day (QID) | INTRAMUSCULAR | Status: DC | PRN
  Administered 2022-12-04 – 2022-12-05 (×2): 30 mg via INTRAVENOUS
  Filled 2022-12-04 (×3): qty 1

## 2022-12-04 MED ORDER — LACTATED RINGERS IV BOLUS
1000.0000 mL | Freq: Once | INTRAVENOUS | Status: AC
  Administered 2022-12-04: 1000 mL via INTRAVENOUS

## 2022-12-04 MED ORDER — POTASSIUM CHLORIDE CRYS ER 20 MEQ PO TBCR
40.0000 meq | EXTENDED_RELEASE_TABLET | Freq: Once | ORAL | Status: AC
  Administered 2022-12-04: 40 meq via ORAL
  Filled 2022-12-04: qty 2

## 2022-12-04 MED ORDER — ALBUMIN HUMAN 25 % IV SOLN
100.0000 g | Freq: Once | INTRAVENOUS | Status: AC
  Administered 2022-12-04: 100 g via INTRAVENOUS
  Filled 2022-12-04: qty 400

## 2022-12-04 MED ORDER — PROCHLORPERAZINE EDISYLATE 10 MG/2ML IJ SOLN
5.0000 mg | Freq: Once | INTRAMUSCULAR | Status: AC | PRN
  Administered 2022-12-04: 5 mg via INTRAVENOUS
  Filled 2022-12-04: qty 2

## 2022-12-04 MED ORDER — LACTATED RINGERS IV BOLUS
500.0000 mL | Freq: Once | INTRAVENOUS | Status: AC
  Administered 2022-12-04: 500 mL via INTRAVENOUS

## 2022-12-04 MED ORDER — ONDANSETRON HCL 4 MG/2ML IJ SOLN
4.0000 mg | Freq: Four times a day (QID) | INTRAMUSCULAR | Status: DC | PRN
  Administered 2022-12-04 – 2022-12-10 (×11): 4 mg via INTRAVENOUS
  Filled 2022-12-04 (×11): qty 2

## 2022-12-04 MED ORDER — MAGNESIUM SULFATE 2 GM/50ML IV SOLN
2.0000 g | Freq: Once | INTRAVENOUS | Status: AC
  Administered 2022-12-04: 2 g via INTRAVENOUS
  Filled 2022-12-04: qty 50

## 2022-12-04 MED ORDER — ONDANSETRON HCL 4 MG/2ML IJ SOLN
4.0000 mg | Freq: Once | INTRAMUSCULAR | Status: AC | PRN
  Administered 2022-12-04: 4 mg via INTRAVENOUS
  Filled 2022-12-04: qty 2

## 2022-12-04 MED ORDER — METOPROLOL SUCCINATE ER 25 MG PO TB24
25.0000 mg | ORAL_TABLET | Freq: Every day | ORAL | Status: DC
  Administered 2022-12-04 – 2022-12-12 (×9): 25 mg via ORAL
  Filled 2022-12-04 (×9): qty 1

## 2022-12-04 MED ORDER — KETOROLAC TROMETHAMINE 15 MG/ML IJ SOLN
15.0000 mg | Freq: Once | INTRAMUSCULAR | Status: AC | PRN
  Administered 2022-12-04: 15 mg via INTRAVENOUS
  Filled 2022-12-04: qty 1

## 2022-12-04 MED ORDER — HYDROMORPHONE HCL 1 MG/ML IJ SOLN
1.0000 mg | Freq: Once | INTRAMUSCULAR | Status: AC
  Administered 2022-12-04: 1 mg via INTRAVENOUS
  Filled 2022-12-04: qty 1

## 2022-12-04 MED ORDER — KETOROLAC TROMETHAMINE 15 MG/ML IJ SOLN
15.0000 mg | Freq: Once | INTRAMUSCULAR | Status: AC
  Administered 2022-12-04: 15 mg via INTRAVENOUS
  Filled 2022-12-04: qty 1

## 2022-12-04 MED ORDER — ACETAMINOPHEN 325 MG PO TABS
650.0000 mg | ORAL_TABLET | Freq: Four times a day (QID) | ORAL | Status: DC | PRN
  Administered 2022-12-04 – 2022-12-10 (×5): 650 mg via ORAL
  Filled 2022-12-04 (×5): qty 2

## 2022-12-04 MED ORDER — LIDOCAINE-EPINEPHRINE (PF) 2 %-1:200000 IJ SOLN
20.0000 mL | Freq: Once | INTRAMUSCULAR | Status: AC
  Administered 2022-12-04: 20 mL via INTRADERMAL
  Filled 2022-12-04: qty 20

## 2022-12-04 MED ORDER — ACETAMINOPHEN 650 MG RE SUPP: 650.0000 mg | Freq: Four times a day (QID) | RECTAL | Status: DC | PRN

## 2022-12-04 MED ORDER — LACTATED RINGERS IV BOLUS: 1000.0000 mL | Freq: Once | INTRAVENOUS | Status: DC

## 2022-12-04 MED ORDER — KETOROLAC TROMETHAMINE 30 MG/ML IJ SOLN: 30.0000 mg | Freq: Four times a day (QID) | INTRAMUSCULAR | Status: DC | PRN

## 2022-12-04 MED ORDER — OXYCODONE HCL 5 MG PO TABS
5.0000 mg | ORAL_TABLET | ORAL | Status: DC | PRN
  Administered 2022-12-05 – 2022-12-09 (×7): 5 mg via ORAL
  Filled 2022-12-04 (×9): qty 1

## 2022-12-04 MED ORDER — HYDROMORPHONE HCL 1 MG/ML IJ SOLN
1.0000 mg | INTRAMUSCULAR | Status: AC | PRN
  Administered 2022-12-04 – 2022-12-05 (×4): 1 mg via INTRAVENOUS
  Filled 2022-12-04 (×5): qty 1

## 2022-12-04 MED ORDER — ONDANSETRON HCL 4 MG PO TABS: 4.0000 mg | ORAL_TABLET | Freq: Four times a day (QID) | ORAL | Status: DC | PRN

## 2022-12-04 MED ORDER — SODIUM CHLORIDE 0.9 % IV SOLN: INTRAVENOUS | Status: AC

## 2022-12-04 MED ORDER — ONDANSETRON HCL 4 MG/2ML IJ SOLN
4.0000 mg | Freq: Once | INTRAMUSCULAR | Status: AC
  Administered 2022-12-04: 4 mg via INTRAVENOUS
  Filled 2022-12-04: qty 2

## 2022-12-04 NOTE — H&P (Addendum)
History and Physical    Patient: Angelica Blankenship:[id number] DOB: Jan 14, 1991 DOA: 12/04/2022 DOS: the patient was seen and examined on 12/04/2022 PCP: Marcine Matar, MD  Patient coming from: Home  Chief Complaint:  Chief Complaint  Patient presents with   Abdominal Pain   HPI: Angelica Blankenship is a 32 y.o. female with medical history significant of ascites, endometriosis, adnexal mass, recurrent ascites with last paracentesis in February this year, headache, asymptomatic heart murmur, hypertension, pulmonary nodule, normocytic anemia who is returning to the hospital for the second day in a row due to abdominal pain.  She was offered to be admitted yesterday, felt better and went home.  However, the hydrocodone that she was prescribed did not help.  She had an episode of emesis several days ago and 1 episode of emesis today.  Stools have been soft.  She denied fever, chills, rhinorrhea, sore throat, wheezing or hemoptysis.  No chest pain, palpitations, diaphoresis, PND, orthopnea or pitting edema of the lower extremities.  No abdominal pain, nausea, emesis, diarrhea, constipation, melena or hematochezia.  No flank pain, dysuria, frequency or hematuria.  No polyuria, polydipsia, polyphagia or blurred vision.   Lab work: Her urinalysis was hazy with a specific gravity greater than 1.046, small hemoglobin, ketones of 20 and protein of more than 300 mg deciliter.  The rest of the urine analysis was within normal parameters.  Body fluid cell count showed red fluid, with 7673 nucleated cell count with 97% neutrophils and 3% monocytes.  LDH was increased to 1245 units/L.  Albumin, total protein and glucose were normal.  Serum pregnancy test was negative.  CBC is her white count 4.8 with 91% neutrophils, hemoglobin 11.5 g/dL and platelets 811.  CMP showed a potassium of 3.3 and CO2 of 17 mmol/L with a normal anion gap.  The rest of the electrolytes and renal function were normal after calcium correction.   Glucose on 127 mg/dL, AST 14 and alkaline phosphatase 31 units/L.  The rest of the hepatic functions were normal.  Imaging: CT abdomen and/pelvis with contrast done yesterday showed recurrence of large volume ascites throughout the abdomen measuring slightly greater than simple fluid.  Findings are not specific and may be related to the patient's history of endometriosis.  Otherwise, no acute abdominal/pelvic findings.  ED course: Initial vital signs were temperature 98.8 F, pulse 142, respirations 16, BP 156/106 mmHg and O2 sat 98%.  Dr. Criss Alvine performed at paracentesis that yielded 4 L of bloody fluid.  The patient received hydromorphone 1 mg IVP, ketorolac 1 mg IVP, LR 1500 mL bolus, ondansetron 4 mg IVP x 1 and K-Lor 40 mg p.o. x 1.  I added hydromorphone 1 mg IVP, ketorolac 15 mg IVP, and ondansetron 4 mg IVP while still in the emergency department.   Review of Systems: As mentioned in the history of present illness. All other systems reviewed and are negative. Past Medical History:  Diagnosis Date   Ascites    Endometriosis    Headache    Heart murmur    asymptomatic   Hypertension    No medications prescribed   Past Surgical History:  Procedure Laterality Date   IR PARACENTESIS  01/01/2020   IR RADIOLOGIST EVAL & MGMT  04/14/2022   LAPAROSCOPY N/A 06/18/2015   Procedure: LAPAROSCOPY DIAGNOSTIC;  Surgeon: Maxie Better, MD;  Location: [facility];  Service: Gynecology;  Laterality: N/A;  90 min. requested   LAPAROSCOPY N/A 03/19/2020   Procedure: LAPAROSCOPY DIAGNOSTIC WITH ROBOTIC ASSISTANCE  AND  BIOPSIES;  Surgeon: Carver Fila, MD;  Location: [facility];  Service: Gynecology;  Laterality: N/A;   ROBOTIC ASSISTED DIAGNOSTIC LAPAROSCOPY N/A 06/18/2015   Procedure:  ROBOTIC ASSISTED DIAGNOSTIC LAPAROSCOPY WITH EXCISION OF PELVIC ENDOMETRIOSIS and lysis of adhesions, chromopertubation;  Surgeon: Maxie Better, MD;  Location: [facility];  Service: Gynecology;  Laterality: N/A;    Social History:  reports that she quit smoking about 6 years ago. Her smoking use included cigars and cigarettes. She has a 0.80 pack-year smoking history. She has never used smokeless tobacco. She reports current alcohol use. She reports current drug use. Frequency: 35.00 times per week. Drug: Marijuana.  No Known Allergies  Family History  Problem Relation Age of Onset   Hypertension Mother    Hypertension Sister    Breast cancer Maternal Aunt    Hypertension Maternal Grandmother    Cervical cancer Maternal Grandmother    Ovarian cancer Neg Hx    Uterine cancer Neg Hx    Colon cancer Neg Hx     Prior to Admission medications   Medication Sig Start Date End Date Taking? Authorizing Provider  HYDROcodone-acetaminophen (NORCO/VICODIN) 5-325 MG tablet Take 2 tablets by mouth every 4 (four) hours as needed. 12/03/22  Yes Gareth Eagle, PA-C  ibuprofen (ADVIL) 800 MG tablet Take 1 tablet (800 mg total) by mouth every 8 (eight) hours as needed for moderate pain. For AFTER surgery only 04/06/20  Yes Cross, [name] D, NP  metroNIDAZOLE (METROGEL) 0.75 % vaginal gel Place 1 Applicatorful vaginally at bedtime.   Yes [provider]  Multiple Vitamin (MULTIVITAMIN WITH MINERALS) TABS tablet Take 1 tablet by mouth daily.   Yes [provider]  acetaminophen (TYLENOL) 500 MG tablet Take 1,000 mg by mouth every 6 (six) hours as needed for moderate pain.  Patient not taking: Reported on 12/04/2022    [provider]  aspirin-acetaminophen-caffeine (EXCEDRIN MIGRAINE) [phone] MG tablet Take 1 tablet by mouth every 6 (six) hours as needed for headache. Patient not taking: Reported on 12/04/2022    [provider]  senna-docusate (SENOKOT-S) 8.6-50 MG tablet Take 2 tablets by mouth at bedtime. For AFTER surgery, do not take if having diarrhea Patient not taking: Reported on 12/04/2022 03/11/20   Warner Mccreedy D, NP    Physical Exam: Vitals:   12/04/22 0835 12/04/22  0930 12/04/22 1134 12/04/22 1230  BP: (!) 157/101 (!) 152/98  (!) 149/95  Pulse: (!) 125 (!) 116  (!) 125  Resp: 17 19  18   Temp:   98.7 F (37.1 C)   TempSrc:      SpO2: 93% 96%  99%   Physical Exam Vitals and nursing note reviewed.  Constitutional:      General: She is awake. She is not in acute distress.    Appearance: She is well-developed and normal weight.  HENT:     Head: Normocephalic.     Nose: No rhinorrhea.     Mouth/Throat:     Mouth: Mucous membranes are dry.  Eyes:     General: No scleral icterus.    Pupils: Pupils are equal, round, and reactive to light.  Neck:     Vascular: No JVD.  Cardiovascular:     Rate and Rhythm: Regular rhythm. Tachycardia present.     Heart sounds: S1 normal and S2 normal.  Pulmonary:     Effort: Pulmonary effort is normal.     Breath sounds: Normal breath sounds.  Abdominal:     General:  Bowel sounds are normal. There is distension.     Palpations: Abdomen is soft.     Tenderness: There is generalized abdominal tenderness. There is no guarding or rebound.  Musculoskeletal:     Cervical back: Neck supple.     Right lower leg: No edema.     Left lower leg: No edema.  Skin:    General: Skin is warm and dry.  Neurological:     General: No focal deficit present.     Mental Status: She is alert and oriented to person, place, and time.  Psychiatric:        Mood and Affect: Mood normal.        Behavior: Behavior normal. Behavior is cooperative.   Data Reviewed:  Results are pending, will review when available.  Assessment and Plan: Principal Problem:   Spontaneous bacterial peritonitis (HCC) Admit to PCU/inpatient. Analgesics as needed: Acetaminophen 650 mg every 6 hours as needed. Oxycodone 5 mg every 4 hours as needed. Hydromorphone 1 mg IVP every 4 hours for severe pain as needed. Ketorolac 30 mg IVP every 6 hours as needed. Antiemetics as needed. Continue ceftriaxone 2 g IVPB daily. Continue azithromycin 500 mg IVPB  daily. Follow-up ascitic fluid Gram stain, culture and sensitivity. Follow-up CBC and chemistry in the morning.   Active Problems:   Endometriosis No need for GYN at the moment per GYN on-call. (Dr. Duane Lope)    Essential hypertension Will begin metoprolol 25 mg p.o. daily. Monitor blood pressure and heart rate.    Normocytic anemia Monitor hematocrit and hemoglobin. Transfuse as needed.    Hypokalemia Replacing. Follow-up potassium level in the morning.    Hypomagnesemia  Magnesium sulfate 2 g IVPB ordered.    Advance Care Planning:   Code Status: Full Code   Consults:   Family Communication: His mother was at bedside.  Severity of Illness: The appropriate patient status for this patient is INPATIENT. Inpatient status is judged to be reasonable and necessary in order to provide the required intensity of service to ensure the patient's safety. The patient's presenting symptoms, physical exam findings, and initial radiographic and laboratory data in the context of their chronic comorbidities is felt to place them at high risk for further clinical deterioration. Furthermore, it is not anticipated that the patient will be medically stable for discharge from the hospital within 2 midnights of admission.   * I certify that at the point of admission it is my clinical judgment that the patient will require inpatient hospital care spanning beyond 2 midnights from the point of admission due to high intensity of service, high risk for further deterioration and high frequency of surveillance required.*  Author: Bobette Mo, MD 12/04/2022 12:51 PM  For on call review [email or web address].   This document was prepared using Dragon voice recognition software and may contain some unintended transcription errors.

## 2022-12-04 NOTE — ED Notes (Signed)
Peritoneal fluid specimen walked to lab.

## 2022-12-04 NOTE — ED Triage Notes (Signed)
Pt arrived via POV, c/o worsening abd pain. Hx of endometriosis, was seen yesterday for same and states pain worsened after leaving ED. Medication prescribed worked for a short time.

## 2022-12-04 NOTE — Progress Notes (Signed)
[facility] admitting physician addendum:  The nursing staff reported that the patient was febrile, tachypneic and tachycardic scoring red on MEWS.  A 1000 mL normal saline bolus was ordered.  Magnesium, phosphorus and lactic acid ordered.  Hydromorphone 1 mg IVP every 4 hours was added as needed for severe pain for quick relief.  Sanda Klein, MD.

## 2022-12-04 NOTE — ED Notes (Signed)
Dr. Criss Alvine stated patient can eat and drink.

## 2022-12-04 NOTE — ED Notes (Signed)
Paracentesis tray at bedside. 

## 2022-12-04 NOTE — ED Provider Notes (Signed)
[place] EMERGENCY DEPARTMENT AT [facility] Provider Note   CSN: [id number] Arrival date & time: 12/04/22  4098     History  Chief Complaint  Patient presents with   Abdominal Pain    Angelica Blankenship is a 32 y.o. female.  HPI 32 year old female with a history of stage IV endometriosis presents with abdominal pain.  She has been dealing with abdominal pain and ascites for a while and had a paracentesis in February.  She has had recurrent but stable swelling since.  Always deals with some degree of pain but has gotten acutely worse starting on 5/30.  Was here yesterday and felt better at the time of discharge but then the pain got worse again and the hydrocodone did not help.  She did vomit this morning and vomited on the day it first started.  She has had a little bit of diarrhea including this morning.  Some pressure when she urinates but no dysuria or hematuria.  She follows with [facility] and they are ultimately setting up for surgery but she still has multiple visits before she will get a date. No fevers.  Home Medications Prior to Admission medications   Medication Sig Start Date End Date Taking? Authorizing Provider  HYDROcodone-acetaminophen (NORCO/VICODIN) 5-325 MG tablet Take 2 tablets by mouth every 4 (four) hours as needed. 12/03/22  Yes Gareth Eagle, PA-C  ibuprofen (ADVIL) 800 MG tablet Take 1 tablet (800 mg total) by mouth every 8 (eight) hours as needed for moderate pain. For AFTER surgery only 04/06/20  Yes Cross, [name] D, NP  metroNIDAZOLE (METROGEL) 0.75 % vaginal gel Place 1 Applicatorful vaginally at bedtime.   Yes [provider]  Multiple Vitamin (MULTIVITAMIN WITH MINERALS) TABS tablet Take 1 tablet by mouth daily.   Yes [provider]  acetaminophen (TYLENOL) 500 MG tablet Take 1,000 mg by mouth every 6 (six) hours as needed for moderate pain.  Patient not taking: Reported on 12/04/2022    [provider]   aspirin-acetaminophen-caffeine (EXCEDRIN MIGRAINE) [phone] MG tablet Take 1 tablet by mouth every 6 (six) hours as needed for headache. Patient not taking: Reported on 12/04/2022    [provider]  senna-docusate (SENOKOT-S) 8.6-50 MG tablet Take 2 tablets by mouth at bedtime. For AFTER surgery, do not take if having diarrhea Patient not taking: Reported on 12/04/2022 03/11/20   Warner Mccreedy D, NP      Allergies    Patient has no known allergies.    Review of Systems   Review of Systems  Constitutional:  Negative for fever.  Respiratory:  Negative for shortness of breath.   Gastrointestinal:  Positive for abdominal pain, diarrhea and vomiting.  Genitourinary:  Negative for dysuria.    Physical Exam Updated Vital Signs BP (!) 160/94   Pulse (!) 125   Temp 98.6 F (37 C) (Oral)   Resp 18   LMP  (LMP Unknown)   SpO2 100%  Physical Exam Vitals and nursing note reviewed.  Constitutional:      Appearance: She is well-developed. She is not diaphoretic.  HENT:     Head: Normocephalic and atraumatic.  Cardiovascular:     Rate and Rhythm: Regular rhythm. Tachycardia present.     Heart sounds: Normal heart sounds.  Pulmonary:     Effort: Pulmonary effort is normal.     Breath sounds: Normal breath sounds.  Abdominal:     Palpations: Abdomen is soft.     Tenderness: There is generalized abdominal  tenderness (worse in the lower abdomen).  Skin:    General: Skin is warm and dry.  Neurological:     Mental Status: She is alert.     ED Results / Procedures / Treatments   Labs (all labs ordered are listed, but only abnormal results are displayed) Labs Reviewed  COMPREHENSIVE METABOLIC PANEL - Abnormal; Notable for the following components:      Result Value   Potassium 3.3 (*)    CO2 17 (*)    Glucose, Bld 127 (*)    Calcium 8.6 (*)    AST 14 (*)    Alkaline Phosphatase 31 (*)    All other components within normal limits  CBC WITH DIFFERENTIAL/PLATELET -  Abnormal; Notable for the following components:   RBC 3.64 (*)    Hemoglobin 11.5 (*)    HCT 34.3 (*)    Lymphs Abs 0.3 (*)    All other components within normal limits  URINALYSIS, W/ REFLEX TO CULTURE (INFECTION SUSPECTED) - Abnormal; Notable for the following components:   Color, Urine AMBER (*)    APPearance HAZY (*)    Specific Gravity, Urine >1.046 (*)    Hgb urine dipstick SMALL (*)    Ketones, ur 20 (*)    Protein, ur >=300 (*)    All other components within normal limits  BODY FLUID CELL COUNT WITH DIFFERENTIAL - Abnormal; Notable for the following components:   Color, Fluid RED (*)    Appearance, Fluid BLOODY (*)    Total Nucleated Cell Count, Fluid 7,673 (*)    Neutrophil Count, Fluid 97 (*)    Monocyte-Macrophage-Serous Fluid 3 (*)    All other components within normal limits  LACTATE DEHYDROGENASE, PLEURAL OR PERITONEAL FLUID - Abnormal; Notable for the following components:   LD, Fluid 1,245 (*)    All other components within normal limits  BODY FLUID CULTURE W GRAM STAIN  LIPASE, BLOOD  HCG, QUANTITATIVE, PREGNANCY  GLUCOSE, PLEURAL OR PERITONEAL FLUID  PROTEIN, PLEURAL OR PERITONEAL FLUID  ALBUMIN, PLEURAL OR PERITONEAL FLUID   PATHOLOGIST SMEAR REVIEW    EKG None  Radiology CT ABDOMEN PELVIS W CONTRAST  Result Date: 12/03/2022 CLINICAL DATA:  Abdominal pain. History of endometriosis and recurrent ascites EXAM: CT ABDOMEN AND PELVIS WITH CONTRAST TECHNIQUE: Multidetector CT imaging of the abdomen and pelvis was performed using the standard protocol following bolus administration of intravenous contrast. RADIATION DOSE REDUCTION: This exam was performed according to the departmental dose-optimization program which includes automated exposure control, adjustment of the mA and/or kV according to patient size and/or use of iterative reconstruction technique. CONTRAST:  OMNIPAQUE IOHEXOL 300 MG/ML  SOLN COMPARISON:  02/28/2020, 07/15/2021 FINDINGS: Lower chest:  Linear bibasilar atelectasis.  Heart size is normal. Hepatobiliary: No focal liver abnormality is seen. No gallstones, gallbladder wall thickening, or biliary dilatation. Pancreas: Unremarkable. No pancreatic ductal dilatation or surrounding inflammatory changes. Spleen: Normal in size without focal abnormality. Adrenals/Urinary Tract: Unremarkable adrenal glands. Kidneys enhance symmetrically without focal lesion, stone, or hydronephrosis. Ureters are nondilated. Urinary bladder is decompressed. Stomach/Bowel: Stomach is within normal limits. Appendix not definitively seen. No evidence of bowel wall thickening, distention, or inflammatory changes. Vascular/Lymphatic: No significant vascular findings are present. No enlarged abdominal or pelvic lymph nodes. Reproductive: Unremarkable uterus. Evaluation of the adnexal regions is limited given the degree of surrounding ascites. Other: Large volume ascites throughout the abdomen measuring slightly greater than simple fluid. No free air. No abdominal wall hernia. Musculoskeletal: Soft tissue thickening in the region  of the umbilicus is unchanged from prior and may be related to prior laparoscopy changes. No acute or significant osseous findings. Mild scoliotic thoracolumbar curvature. IMPRESSION: 1. Recurrent large volume ascites throughout the abdomen measuring slightly greater than simple fluid. Findings are nonspecific and may be related to patient's history of endometriosis. 2. Otherwise, no acute abdominopelvic findings. Electronically Signed   By: Duanne Guess D.O.   On: 12/03/2022 13:53    Procedures .Paracentesis  Date/Time: 12/04/2022 10:35 AM  Performed by: Pricilla Loveless, MD Authorized by: Pricilla Loveless, MD   Consent:    Consent obtained:  Verbal and written   Consent given by:  Patient   Risks, benefits, and alternatives were discussed: yes     Risks discussed:  Bleeding, bowel perforation, infection and pain Universal protocol:     Patient identity confirmed:  Verbally with patient Pre-procedure details:    Procedure purpose:  Therapeutic   Preparation: Patient was prepped and draped in usual sterile fashion   Anesthesia:    Anesthesia method:  Local infiltration   Local anesthetic:  Lidocaine 1% w/o epi Procedure details:    Ultrasound guidance: yes     Puncture site:  R lower quadrant   Fluid removed amount:  4L   Fluid characteristics: brown.   Dressing:  Adhesive bandage Post-procedure details:    Procedure completion:  Tolerated well, no immediate complications .Critical Care  Performed by: Pricilla Loveless, MD Authorized by: Pricilla Loveless, MD   Critical care provider statement:    Critical care time (minutes):  40   Critical care time was exclusive of:  Separately billable procedures and treating other patients   Critical care was necessary to treat or prevent imminent or life-threatening deterioration of the following conditions:  Sepsis   Critical care was time spent personally by me on the following activities:  Development of treatment plan with patient or surrogate, discussions with consultants, evaluation of patient's response to treatment, examination of patient, ordering and review of laboratory studies, ordering and review of radiographic studies, ordering and performing treatments and interventions, pulse oximetry, re-evaluation of patient's condition and review of old charts     Medications Ordered in ED Medications  cefTRIAXone (ROCEPHIN) 2 g in sodium chloride 0.9 % 100 mL IVPB (0 g Intravenous Stopped 12/04/22 1334)  acetaminophen (TYLENOL) tablet 650 mg (has no administration in time range)    Or  acetaminophen (TYLENOL) suppository 650 mg (has no administration in time range)  ondansetron (ZOFRAN) tablet 4 mg (has no administration in time range)    Or  ondansetron (ZOFRAN) injection 4 mg (has no administration in time range)  oxyCODONE (Oxy IR/ROXICODONE) immediate release tablet 5 mg  (has no administration in time range)  ketorolac (TORADOL) 30 MG/ML injection 30 mg (has no administration in time range)  HYDROmorphone (DILAUDID) injection 1 mg (1 mg Intravenous Given 12/04/22 0803)  ondansetron (ZOFRAN) injection 4 mg (4 mg Intravenous Given 12/04/22 0803)  lactated ringers bolus 500 mL (0 mLs Intravenous Stopped 12/04/22 0925)  lidocaine-EPINEPHrine (XYLOCAINE W/EPI) 2 %-1:200000 (PF) injection 20 mL (20 mLs Intradermal Given by Other 12/04/22 0813)  lactated ringers bolus 1,000 mL (0 mLs Intravenous Stopped 12/04/22 1148)  ketorolac (TORADOL) 15 MG/ML injection 15 mg (15 mg Intravenous Given 12/04/22 1041)  potassium chloride SA (KLOR-CON M) CR tablet 40 mEq (40 mEq Oral Given 12/04/22 1057)  albumin human 25 % solution 100 g (100 g Intravenous New Bag/Given 12/04/22 1418)  HYDROmorphone (DILAUDID) injection 1 mg (1 mg Intravenous Given  12/04/22 1411)  ketorolac (TORADOL) 15 MG/ML injection 15 mg (15 mg Intravenous Given 12/04/22 1415)  ondansetron (ZOFRAN) injection 4 mg (4 mg Intravenous Given 12/04/22 1415)    ED Course/ Medical Decision Making/ A&P                             Medical Decision Making Amount and/or Complexity of Data Reviewed Independent Historian: parent Labs: ordered.    Details: Normal WBC Peritoneal fluid with 7000+ WBC, mostly PMNs, c/w SBP  Risk Prescription drug management. Decision regarding hospitalization.   Patient presents with abdominal pain. Was here yesterday and had a CT that showed ascites but no other acute findings.  Given the worsening of otherwise chronic pain is from her endometriosis and ascites, a paracentesis was performed after initial IV pain control.  Given the significant WBC count and PMN percentage, I am concerned for SBP.  Treated with albumin and ceftriaxone. Given IV fluids. Pain is much better after Dilaudid. No hypotension or end organ to suggest abdominal compartment syndrome. Discussed with Dr. Robb Matar for admission. I also  discussed with Dr. Despina Hidden of GYN. No indication for emergent GYN treatment, treat with supportive care.         Final Clinical Impression(s) / ED Diagnoses Final diagnoses:  Spontaneous bacterial peritonitis [facility])    Rx / DC Orders ED Discharge Orders     None         Pricilla Loveless, MD 12/04/22 1530

## 2022-12-04 NOTE — ED Notes (Signed)
ED TO INPATIENT HANDOFF REPORT  Name/Age/Gender Angelica Blankenship 32 y.o. female  Code Status Code Status History     Date Active Date Inactive Code Status Order ID Comments User Context   03/19/2020 1058 03/19/2020 2135 Full Code [id number]  Doylene Bode, NP Inpatient       Home/SNF/Other Home  Chief Complaint Spontaneous bacterial peritonitis (HCC) [K65.2]  Level of Care/Admitting Diagnosis ED Disposition     ED Disposition  Admit   Condition  --   Comment  Hospital Area: [facility] [[id number]]  Level of Care: Telemetry [5]  Admit to tele based on following criteria: Other see comments  Comments: Tachycardia  May admit patient to [facility] or [facility] if equivalent level of care is available:: No  Covid Evaluation: Asymptomatic - no recent exposure (last 10 days) testing not required  Diagnosis: Spontaneous bacterial peritonitis (HCC) [409.81.ICD-9-CM]  Admitting Physician: Bobette Mo [[id number]]  Attending Physician: Bobette Mo [[id number]]  Certification:: I certify this patient will need inpatient services for at least 2 midnights  Estimated Length of Stay: 2          Medical History Past Medical History:  Diagnosis Date   Ascites    Endometriosis    Headache    Heart murmur    asymptomatic   Hypertension    No medications prescribed    Allergies No Known Allergies  IV Location/Drains/Wounds Patient Lines/Drains/Airways Status     Active Line/Drains/Airways     Name Placement date Placement time Site Days   Peripheral IV 12/04/22 20 G Left Antecubital 12/04/22  0747  Antecubital  less than 1   Incision - 4 Ports Abdomen 1: Umbilicus 2: Right;Lateral 3: Left;Lateral 4: Left;Lateral;Upper 03/19/20  --  -- 990   Incision - 1 Port Abdomen 1: [place];Umbilicus 03/19/20  --  -- 990   Incision - 5 Ports Abdomen 1: Umbilicus 2: Medial;Lateral 3: Left;Lateral;Lower 4: Right;Lateral;Lower Right;Medial;Lateral  06/18/15  1336  -- 2726            Labs/Imaging Results for orders placed or performed during the hospital encounter of 12/04/22 (from the past 48 hour(s))  Comprehensive metabolic panel     Status: Abnormal   Collection Time: 12/04/22  7:40 AM  Result Value Ref Range   Sodium 135 135 - 145 mmol/L   Potassium 3.3 (L) 3.5 - 5.1 mmol/L   Chloride 107 98 - 111 mmol/L   CO2 17 (L) 22 - 32 mmol/L   Glucose, Bld 127 (H) 70 - 99 mg/dL    Comment: Glucose reference range applies only to samples taken after fasting for at least 8 hours.   BUN 15 6 - 20 mg/dL   Creatinine, Ser 8.65 0.44 - 1.00 mg/dL   Calcium 8.6 (L) 8.9 - 10.3 mg/dL   Total Protein 7.9 6.5 - 8.1 g/dL   Albumin 3.5 3.5 - 5.0 g/dL   AST 14 (L) 15 - 41 U/L   ALT 14 0 - 44 U/L   Alkaline Phosphatase 31 (L) 38 - 126 U/L   Total Bilirubin 0.5 0.3 - 1.2 mg/dL   GFR, Estimated >78 >46 mL/min    Comment: (NOTE) Calculated using the CKD-EPI Creatinine Equation (2021)    Anion gap 11 5 - 15    Comment: Performed at [facility], [address], [place], [place] [address]  Lipase, blood     Status: None   Collection Time: 12/04/22  7:40 AM  Result Value Ref Range   Lipase 23 11 - 51 U/L    Comment: Performed at [facility], [address], [place], [place] [address]  CBC with Differential     Status: Abnormal   Collection Time: 12/04/22  7:40 AM  Result Value Ref Range   WBC 4.8 4.0 - 10.5 K/uL   RBC 3.64 (L) 3.87 - 5.11 MIL/uL   Hemoglobin 11.5 (L) 12.0 - 15.0 g/dL   HCT 02.7 (L) 25.3 - 66.4 %   MCV 94.2 80.0 - 100.0 fL   MCH 31.6 26.0 - 34.0 pg   MCHC 33.5 30.0 - 36.0 g/dL   RDW 40.3 47.4 - 25.9 %   Platelets 314 150 - 400 K/uL   nRBC 0.0 0.0 - 0.2 %   Neutrophils Relative % 91 %   Neutro Abs 4.3 1.7 - 7.7 K/uL   Lymphocytes Relative 5 %   Lymphs Abs 0.3 (L) 0.7 - 4.0 K/uL   Monocytes Relative 4 %   Monocytes Absolute 0.2 0.1 - 1.0 K/uL   Eosinophils Relative 0 %    Eosinophils Absolute 0.0 0.0 - 0.5 K/uL   Basophils Relative 0 %   Basophils Absolute 0.0 0.0 - 0.1 K/uL   Immature Granulocytes 0 %   Abs Immature Granulocytes 0.01 0.00 - 0.07 K/uL    Comment: Performed at [facility], [address] [place], [place] [address]  hCG, quantitative, pregnancy     Status: None   Collection Time: 12/04/22  7:40 AM  Result Value Ref Range   hCG, Beta Chain, Quant, S <1 <5 mIU/mL    Comment:          GEST. AGE      CONC.  (mIU/mL)   <=1 WEEK        5 - 50     2 WEEKS       50 - 500     3 WEEKS       100 - 10,000     4 WEEKS     1,000 - 30,000     5 WEEKS     3,500 - 115,000   6-8 WEEKS     12,000 - 270,000    12 WEEKS     15,000 - 220,000        FEMALE AND NON-PREGNANT FEMALE:     LESS THAN 5 mIU/mL Performed at [facility], [address], [place], [place] [address]   Urinalysis, w/ Reflex to Culture (Infection Suspected) -Urine, Clean Catch     Status: Abnormal   Collection Time: 12/04/22  8:17 AM  Result Value Ref Range   Specimen Source URINE, CLEAN CATCH    Color, Urine AMBER (A) YELLOW    Comment: BIOCHEMICALS MAY BE AFFECTED BY COLOR   APPearance HAZY (A) CLEAR   Specific Gravity, Urine >1.046 (H) 1.005 - 1.030   pH 5.0 5.0 - 8.0   Glucose, UA NEGATIVE NEGATIVE mg/dL   Hgb urine dipstick SMALL (A) NEGATIVE   Bilirubin Urine NEGATIVE NEGATIVE   Ketones, ur 20 (A) NEGATIVE mg/dL   Protein, ur >=295 (A) NEGATIVE mg/dL   Nitrite NEGATIVE NEGATIVE   Leukocytes,Ua NEGATIVE NEGATIVE   RBC / HPF 11-20 0 - 5 RBC/hpf   WBC, UA 11-20 0 - 5 WBC/hpf    Comment:        Reflex urine culture not performed if WBC <=10, OR if Squamous epithelial cells >5. If Squamous epithelial  cells >5 suggest recollection.    Bacteria, UA NONE SEEN NONE SEEN   Squamous Epithelial / HPF 11-20 0 - 5 /HPF   Mucus PRESENT     Comment: Performed at [facility], [address] [place], [place] [address]  Body  fluid cell count with differential     Status: Abnormal   Collection Time: 12/04/22 10:09 AM  Result Value Ref Range   Fluid Type-FCT PERITONEAL     Comment: CORRECTED ON 06/02 AT 1026: PREVIOUSLY REPORTED AS Peritoneal   Color, Fluid RED (A) YELLOW   Appearance, Fluid BLOODY (A) CLEAR   Total Nucleated Cell Count, Fluid 7,673 (H) 0 - 1,000 cu mm   Neutrophil Count, Fluid 97 (H) 0 - 25 %   Lymphs, Fluid 0 %   Monocyte-Macrophage-Serous Fluid 3 (L) 50 - 90 %   Eos, Fluid 0 %    Comment: Performed at [facility], [address] [place], [place] [address]   CT ABDOMEN PELVIS W CONTRAST  Result Date: 12/03/2022 CLINICAL DATA:  Abdominal pain. History of endometriosis and recurrent ascites EXAM: CT ABDOMEN AND PELVIS WITH CONTRAST TECHNIQUE: Multidetector CT imaging of the abdomen and pelvis was performed using the standard protocol following bolus administration of intravenous contrast. RADIATION DOSE REDUCTION: This exam was performed according to the departmental dose-optimization program which includes automated exposure control, adjustment of the mA and/or kV according to patient size and/or use of iterative reconstruction technique. CONTRAST:  OMNIPAQUE IOHEXOL 300 MG/ML  SOLN COMPARISON:  02/28/2020, 07/15/2021 FINDINGS: Lower chest: Linear bibasilar atelectasis.  Heart size is normal. Hepatobiliary: No focal liver abnormality is seen. No gallstones, gallbladder wall thickening, or biliary dilatation. Pancreas: Unremarkable. No pancreatic ductal dilatation or surrounding inflammatory changes. Spleen: Normal in size without focal abnormality. Adrenals/Urinary Tract: Unremarkable adrenal glands. Kidneys enhance symmetrically without focal lesion, stone, or hydronephrosis. Ureters are nondilated. Urinary bladder is decompressed. Stomach/Bowel: Stomach is within normal limits. Appendix not definitively seen. No evidence of bowel wall thickening, distention, or inflammatory  changes. Vascular/Lymphatic: No significant vascular findings are present. No enlarged abdominal or pelvic lymph nodes. Reproductive: Unremarkable uterus. Evaluation of the adnexal regions is limited given the degree of surrounding ascites. Other: Large volume ascites throughout the abdomen measuring slightly greater than simple fluid. No free air. No abdominal wall hernia. Musculoskeletal: Soft tissue thickening in the region of the umbilicus is unchanged from prior and may be related to prior laparoscopy changes. No acute or significant osseous findings. Mild scoliotic thoracolumbar curvature. IMPRESSION: 1. Recurrent large volume ascites throughout the abdomen measuring slightly greater than simple fluid. Findings are nonspecific and may be related to patient's history of endometriosis. 2. Otherwise, no acute abdominopelvic findings. Electronically Signed   By: Duanne Guess D.O.   On: 12/03/2022 13:53    Pending Labs Unresulted Labs (From admission, onward)     Start     Ordered   12/04/22 1233  Lactate dehydrogenase (pleural or peritoneal fluid)  (Peritoneal fluid analysis panel (pnl))  ONCE - URGENT,   URGENT        12/04/22 1232   12/04/22 1233  Glucose, pleural or peritoneal fluid  (Peritoneal fluid analysis panel (pnl))  ONCE - URGENT,   URGENT        12/04/22 1232   12/04/22 1233  Protein, pleural or peritoneal fluid  (Peritoneal fluid analysis panel (pnl))  ONCE - URGENT,   URGENT        12/04/22 1232   12/04/22  1233  Albumin, pleural or peritoneal fluid   (Peritoneal fluid analysis panel (pnl))  ONCE - URGENT,   URGENT        12/04/22 1232   12/04/22 1009  Pathologist smear review  Once,   R        12/04/22 1009   12/04/22 0810  Body fluid culture w Gram Stain  ONCE - STAT,   URGENT       Question:  Are there also cytology or pathology orders on this specimen?  Answer:  No   12/04/22 0809            Vitals/Pain Today's Vitals   12/04/22 0930 12/04/22 1134 12/04/22 1230  12/04/22 1249  BP: (!) 152/98  (!) 149/95   Pulse: (!) 116  (!) 125 (!) 119  Resp: 19  18   Temp:  98.7 F (37.1 C)    TempSrc:      SpO2: 96%  99% 100%  PainSc:        Isolation Precautions No active isolations  Medications Medications  albumin human 25 % solution 100 g (has no administration in time range)  cefTRIAXone (ROCEPHIN) 2 g in sodium chloride 0.9 % 100 mL IVPB (2 g Intravenous New Bag/Given 12/04/22 1237)  HYDROmorphone (DILAUDID) injection 1 mg (1 mg Intravenous Given 12/04/22 0803)  ondansetron (ZOFRAN) injection 4 mg (4 mg Intravenous Given 12/04/22 0803)  lactated ringers bolus 500 mL (0 mLs Intravenous Stopped 12/04/22 0925)  lidocaine-EPINEPHrine (XYLOCAINE W/EPI) 2 %-1:200000 (PF) injection 20 mL (20 mLs Intradermal Given by Other 12/04/22 0813)  lactated ringers bolus 1,000 mL (0 mLs Intravenous Stopped 12/04/22 1148)  ketorolac (TORADOL) 15 MG/ML injection 15 mg (15 mg Intravenous Given 12/04/22 1041)  potassium chloride SA (KLOR-CON M) CR tablet 40 mEq (40 mEq Oral Given 12/04/22 1057)    Mobility walks

## 2022-12-04 NOTE — ED Notes (Signed)
Dr Ortiz at bedside 

## 2022-12-04 NOTE — ED Notes (Signed)
Patient signed consent for albumin electronically.

## 2022-12-04 NOTE — ED Notes (Signed)
Electronic consent signed by patient, myself, Dr. Criss Alvine for paracentesis.

## 2022-12-05 ENCOUNTER — Encounter: Payer: Self-pay | Admitting: Gynecologic Oncology

## 2022-12-05 DIAGNOSIS — K652 Spontaneous bacterial peritonitis: Secondary | ICD-10-CM | POA: Diagnosis not present

## 2022-12-05 LAB — CBC
HCT: 27 % — ABNORMAL LOW (ref 36.0–46.0)
HCT: 26.8 % — ABNORMAL LOW (ref 36.0–46.0)
Hemoglobin: 9.1 g/dL — ABNORMAL LOW (ref 12.0–15.0)
Hemoglobin: 9.1 g/dL — ABNORMAL LOW (ref 12.0–15.0)
MCH: 31.6 pg (ref 26.0–34.0)
MCH: 31.6 pg (ref 26.0–34.0)
MCHC: 33.7 g/dL (ref 30.0–36.0)
MCHC: 34 g/dL (ref 30.0–36.0)
MCV: 93.8 fL (ref 80.0–100.0)
MCV: 93.1 fL (ref 80.0–100.0)
Platelets: 247 10*3/uL (ref 150–400)
Platelets: 254 10*3/uL (ref 150–400)
RBC: 2.88 MIL/uL — ABNORMAL LOW (ref 3.87–5.11)
RBC: 2.88 MIL/uL — ABNORMAL LOW (ref 3.87–5.11)
RDW: 14.6 % (ref 11.5–15.5)
RDW: 14.8 % (ref 11.5–15.5)
WBC: 1.8 10*3/uL — ABNORMAL LOW (ref 4.0–10.5)
nRBC: 0 % (ref 0.0–0.2)

## 2022-12-05 LAB — COMPREHENSIVE METABOLIC PANEL
ALT: 13 U/L (ref 0–44)
AST: 18 U/L (ref 15–41)
Albumin: 4 g/dL (ref 3.5–5.0)
Alkaline Phosphatase: 17 U/L — ABNORMAL LOW (ref 38–126)
Anion gap: 10 (ref 5–15)
BUN: 19 mg/dL (ref 6–20)
CO2: 16 mmol/L — ABNORMAL LOW (ref 22–32)
Calcium: 8 mg/dL — ABNORMAL LOW (ref 8.9–10.3)
Chloride: 108 mmol/L (ref 98–111)
Creatinine, Ser: 1.15 mg/dL — ABNORMAL HIGH (ref 0.44–1.00)
GFR, Estimated: >60 mL/min (ref >60)
Glucose, Bld: 115 mg/dL — ABNORMAL HIGH (ref 70–99)
Potassium: 4.2 mmol/L (ref 3.5–5.1)
Sodium: 134 mmol/L — ABNORMAL LOW (ref 135–145)
Total Bilirubin: 1.2 mg/dL (ref 0.3–1.2)
Total Protein: 6.6 g/dL (ref 6.5–8.1)

## 2022-12-05 LAB — BODY FLUID CULTURE W GRAM STAIN

## 2022-12-05 LAB — PHOSPHORUS: Phosphorus: 3.5 mg/dL (ref 2.5–4.6)

## 2022-12-05 LAB — MAGNESIUM: Magnesium: 2.3 mg/dL (ref 1.7–2.4)

## 2022-12-05 MED ORDER — DIPHENHYDRAMINE HCL 50 MG/ML IJ SOLN
12.5000 mg | Freq: Four times a day (QID) | INTRAMUSCULAR | Status: DC | PRN
  Administered 2022-12-05 – 2022-12-09 (×13): 12.5 mg via INTRAVENOUS
  Filled 2022-12-05 (×13): qty 1

## 2022-12-05 MED ORDER — POLYETHYLENE GLYCOL 3350 17 G PO PACK
17.0000 g | PACK | Freq: Every day | ORAL | Status: DC | PRN
  Administered 2022-12-06 – 2022-12-10 (×3): 17 g via ORAL
  Filled 2022-12-05 (×3): qty 1

## 2022-12-05 MED ORDER — HYDROMORPHONE HCL 1 MG/ML IJ SOLN
2.0000 mg | INTRAMUSCULAR | Status: AC | PRN
  Administered 2022-12-05 – 2022-12-06 (×4): 2 mg via INTRAVENOUS
  Filled 2022-12-05 (×4): qty 2

## 2022-12-05 MED ORDER — DOCUSATE SODIUM 100 MG PO CAPS
100.0000 mg | ORAL_CAPSULE | Freq: Two times a day (BID) | ORAL | Status: DC
  Administered 2022-12-05 – 2022-12-12 (×12): 100 mg via ORAL
  Filled 2022-12-05 (×13): qty 1

## 2022-12-05 MED ORDER — KETOROLAC TROMETHAMINE 30 MG/ML IJ SOLN
30.0000 mg | Freq: Four times a day (QID) | INTRAMUSCULAR | Status: DC
  Administered 2022-12-05 – 2022-12-06 (×4): 30 mg via INTRAVENOUS
  Filled 2022-12-05 (×3): qty 1

## 2022-12-05 MED ORDER — VANCOMYCIN HCL 1250 MG/250ML IV SOLN
1250.0000 mg | Freq: Once | INTRAVENOUS | Status: AC
  Administered 2022-12-05: 1250 mg via INTRAVENOUS
  Filled 2022-12-05: qty 250

## 2022-12-05 MED ORDER — VANCOMYCIN HCL 750 MG/150ML IV SOLN
750.0000 mg | Freq: Two times a day (BID) | INTRAVENOUS | Status: DC
  Administered 2022-12-06: 750 mg via INTRAVENOUS
  Filled 2022-12-05 (×2): qty 150

## 2022-12-05 MED ORDER — NORETHINDRONE ACETATE 5 MG PO TABS
5.0000 mg | ORAL_TABLET | Freq: Two times a day (BID) | ORAL | Status: DC
  Administered 2022-12-05 – 2022-12-12 (×15): 5 mg via ORAL
  Filled 2022-12-05 (×15): qty 1

## 2022-12-05 MED ORDER — SODIUM CHLORIDE 0.9 % IV SOLN: INTRAVENOUS | Status: AC

## 2022-12-05 NOTE — Progress Notes (Signed)
Pharmacy Antibiotic Note  Angelica Blankenship is a 32 y.o. female admitted on 12/04/2022 with SBP.  Peritoneal culture now with staph aureus growing and sensitivities pending.  Pharmacy has been consulted for vancomycin dosing. SCr bumped up slightly today to 1.15 and CrCl ~ 65 ml/min.   Plan: Vancomycin 1250 mg x 1 then 750 mg every 12 hours (Predicted AUC 530 with SCr 1.15)  Monitor culture, renal function and levels as appropriate  Height: 5\' 6"  (167.6 cm) Weight: 67.2 kg (148 lb 2.4 oz) IBW/kg (Calculated) : 59.3  Temp (24hrs), Avg:99.3 F (37.4 C), Min:97.9 F (36.6 C), Max:102.4 F (39.1 C)  Recent Labs  Lab 12/03/22 1233 12/03/22 1242 12/04/22 0740 12/04/22 1818 12/04/22 2053 12/05/22 0405 12/05/22 0807  WBC 9.2  --  4.8  --   --  QUESTIONABLE RESULTS, RECOMMEND RECOLLECT TO VERIFY 1.8*  CREATININE 0.75 0.80 0.89  --   --  1.15*  --   LATICACIDVEN  --   --   --  1.7 1.5  --   --     Estimated Creatinine Clearance: 65.1 mL/min (A) (by C-G formula based on SCr of 1.15 mg/dL (H)).    No Known Allergies    Thank you for allowing pharmacy to be a part of this patient's care.  Sharin Mons, PharmD, BCPS, BCIDP Infectious Diseases Clinical Pharmacist Phone: [phone] 12/05/2022 1:48 PM

## 2022-12-05 NOTE — Progress Notes (Signed)
PROGRESS NOTE    Angelica Blankenship  ZOX:[id number] DOB: Sep 24, 1990 DOA: 12/04/2022 PCP: Marcine Matar, MD    Brief Narrative:  Angelica Blankenship is a 32 y.o. female with medical history significant of ascites, endometriosis, adnexal mass, recurrent ascites with last paracentesis in February this year, headache, asymptomatic heart murmur, hypertension, pulmonary nodule, normocytic anemia who is returning to the hospital for the second day in a row due to abdominal pain.  She was offered to be admitted yesterday, felt better and went home.  However, the hydrocodone that she was prescribed did not help.  She had an episode of emesis several days ago and 1 episode of emesis today.  4L removed in ER via paracentesis.     Assessment and Plan: Spontaneous bacterial peritonitis (HCC) (ascites from endometriosis) Acetaminophen 650 mg every 6 hours as needed. -pain control Antiemetics as needed. Continue ceftriaxone 2 g IVPB daily. - fluid Gram stain, culture and sensitivity.    Endometriosis No need for GYN at the moment per GYN on-call. (Dr. Duane Lope)- will reach out to doc at [facility] -restart hormonal treatment     Essential hypertension -BB started in ER    Normocytic anemia Monitor hematocrit and hemoglobin. Transfuse as needed.     Hypokalemia Replacing.     Hypomagnesemia/hypophosphatemia  Magnesium sulfate 2 g IVPB ordered.     DVT prophylaxis: SCDs Start: 12/04/22 1254    Code Status: Full Code Family Communication: at bedside  Disposition Plan:  Level of care: Progressive Status is: Inpatient Remains inpatient appropriate     Consultants:  none    Subjective: Pain is uncontrolled  Objective: Vitals:   12/05/22 0411 12/05/22 0500 12/05/22 0733 12/05/22 0926  BP:  125/81 126/82 136/80  Pulse: (!) 141 (!) 124 (!) 123 (!) 137  Resp: (!) 25 (!) 30 (!) 34   Temp: 97.9 F (36.6 C)  97.9 F (36.6 C)   TempSrc: Oral  Oral   SpO2: 98% 97% 96%   Weight:       Height:        Intake/Output Summary (Last 24 hours) at 12/05/2022 1128 Last data filed at 12/05/2022 0426 Gross per 24 hour  Intake 3354.75 ml  Output --  Net 3354.75 ml   Filed Weights   12/04/22 1700  Weight: 67.2 kg    Examination:   General: Appearance:    Well developed, well nourished female in no acute distress     Lungs:     respirations unlabored  Heart:    Tachycardic. Normal rhythm. No murmurs, rubs, or gallops.    MS:   All extremities are intact.    Neurologic:   Awake, alert, oriented x 3. No apparent focal neurological           defect.        Data Reviewed: I have personally reviewed following labs and imaging studies  CBC: Recent Labs  Lab 12/03/22 1233 12/03/22 1242 12/04/22 0740 12/05/22 0405 12/05/22 0807  WBC 9.2  --  4.8 QUESTIONABLE RESULTS, RECOMMEND RECOLLECT TO VERIFY 1.8*  NEUTROABS 8.6*  --  4.3  --   --   HGB 12.2 12.9 11.5* 9.1* 9.1*  HCT 36.4 38.0 34.3* 27.0* 26.8*  MCV 95.0  --  94.2 93.8 93.1  PLT 326  --  314 247 254   Basic Metabolic Panel: Recent Labs  Lab 12/03/22 1233 12/03/22 1242 12/04/22 0740 12/04/22 1817 12/05/22 0402 12/05/22 0405  NA 134* 139 135  --   --  134*  K 3.3* 3.3* 3.3*  --   --  4.2  CL 108 108 107  --   --  108  CO2 16*  --  17*  --   --  16*  GLUCOSE 114* 119* 127*  --   --  115*  BUN 9 7 15   --   --  19  CREATININE 0.75 0.80 0.89  --   --  1.15*  CALCIUM 8.7*  --  8.6*  --   --  8.0*  MG  --   --   --  1.6* 2.3  --   PHOS  --   --   --  1.0* 3.5  --    GFR: Estimated Creatinine Clearance: 65.1 mL/min (A) (by C-G formula based on SCr of 1.15 mg/dL (H)). Liver Function Tests: Recent Labs  Lab 12/03/22 1233 12/04/22 0740 12/05/22 0405  AST 16 14* 18  ALT 18 14 13   ALKPHOS 39 31* 17*  BILITOT 0.6 0.5 1.2  PROT 8.0 7.9 6.6  ALBUMIN 3.7 3.5 4.0   Recent Labs  Lab 12/03/22 1233 12/04/22 0740  LIPASE 27 23   No results for input(s): "AMMONIA" in the last 168 hours. Coagulation  Profile: No results for input(s): "INR", "PROTIME" in the last 168 hours. Cardiac Enzymes: No results for input(s): "CKTOTAL", "CKMB", "CKMBINDEX", "TROPONINI" in the last 168 hours. BNP (last 3 results) No results for input(s): "PROBNP" in the last 8760 hours. HbA1C: No results for input(s): "HGBA1C" in the last 72 hours. CBG: No results for input(s): "GLUCAP" in the last 168 hours. Lipid Profile: No results for input(s): "CHOL", "HDL", "LDLCALC", "TRIG", "CHOLHDL", "LDLDIRECT" in the last 72 hours. Thyroid Function Tests: No results for input(s): "TSH", "T4TOTAL", "FREET4", "T3FREE", "THYROIDAB" in the last 72 hours. Anemia Panel: No results for input(s): "VITAMINB12", "FOLATE", "FERRITIN", "TIBC", "IRON", "RETICCTPCT" in the last 72 hours. Sepsis Labs: Recent Labs  Lab 12/04/22 1818 12/04/22 2053  LATICACIDVEN 1.7 1.5    Recent Results (from the past 240 hour(s))  Body fluid culture w Gram Stain     Status: None (Preliminary result)   Collection Time: 12/04/22 10:09 AM   Specimen: Peritoneal Washings; Body Fluid  Result Value Ref Range Status   Specimen Description   Final    PERITONEAL Performed at [facility], [address] [place], [place] [address]    Special Requests   Final    NONE Performed at [facility], [address] [place], [place] [address]    Gram Stain   Final    FEW WBC PRESENT, PREDOMINANTLY PMN RED BLOOD CELLS PRESENT NO ORGANISMS SEEN    Culture   Final    FEW STAPHYLOCOCCUS AUREUS SUSCEPTIBILITIES TO FOLLOW Performed at [facility], [address], [place], [place] [address]    Report Status PENDING  Incomplete         Radiology Studies: CT ABDOMEN PELVIS W CONTRAST  Result Date: 12/03/2022 CLINICAL DATA:  Abdominal pain. History of endometriosis and recurrent ascites EXAM: CT ABDOMEN AND PELVIS WITH CONTRAST TECHNIQUE: Multidetector CT imaging of the abdomen and pelvis was performed  using the standard protocol following bolus administration of intravenous contrast. RADIATION DOSE REDUCTION: This exam was performed according to the departmental dose-optimization program which includes automated exposure control, adjustment of the mA and/or kV according to patient size and/or use of iterative reconstruction technique. CONTRAST:  OMNIPAQUE IOHEXOL 300 MG/ML  SOLN COMPARISON:  02/28/2020, 07/15/2021 FINDINGS:  Lower chest: Linear bibasilar atelectasis.  Heart size is normal. Hepatobiliary: No focal liver abnormality is seen. No gallstones, gallbladder wall thickening, or biliary dilatation. Pancreas: Unremarkable. No pancreatic ductal dilatation or surrounding inflammatory changes. Spleen: Normal in size without focal abnormality. Adrenals/Urinary Tract: Unremarkable adrenal glands. Kidneys enhance symmetrically without focal lesion, stone, or hydronephrosis. Ureters are nondilated. Urinary bladder is decompressed. Stomach/Bowel: Stomach is within normal limits. Appendix not definitively seen. No evidence of bowel wall thickening, distention, or inflammatory changes. Vascular/Lymphatic: No significant vascular findings are present. No enlarged abdominal or pelvic lymph nodes. Reproductive: Unremarkable uterus. Evaluation of the adnexal regions is limited given the degree of surrounding ascites. Other: Large volume ascites throughout the abdomen measuring slightly greater than simple fluid. No free air. No abdominal wall hernia. Musculoskeletal: Soft tissue thickening in the region of the umbilicus is unchanged from prior and may be related to prior laparoscopy changes. No acute or significant osseous findings. Mild scoliotic thoracolumbar curvature. IMPRESSION: 1. Recurrent large volume ascites throughout the abdomen measuring slightly greater than simple fluid. Findings are nonspecific and may be related to patient's history of endometriosis. 2. Otherwise, no acute abdominopelvic findings.  Electronically Signed   By: Duanne Guess D.O.   On: 12/03/2022 13:53        Scheduled Meds:  docusate sodium  100 mg Oral BID   ketorolac  30 mg Intravenous Q6H   metoprolol succinate  25 mg Oral Daily   norethindrone  5 mg Oral BID   Continuous Infusions:  sodium chloride 100 mL/hr at 12/05/22 0804   cefTRIAXone (ROCEPHIN)  IV Stopped (12/04/22 1334)     LOS: 1 day    Time spent: 45 minutes spent on chart review, discussion with nursing staff, consultants, updating family and interview/physical exam; more than 50% of that time was spent in counseling and/or coordination of care.    Joseph Art, DO [facility] Available via Epic secure chat 7am-7pm After these hours, please refer to coverage provider listed on [email or web address] 12/05/2022, 11:28 AM

## 2022-12-05 NOTE — TOC CM/SW Note (Signed)
Transition of Care [facility]) - Inpatient Brief Assessment   Patient Details  Name: Angelica Blankenship MRN: [id number] Date of Birth: 1991-05-30  [facility] CM/SW Contact:    Howell Rucks, RN Phone Number: 12/05/2022, 1:40 PM   Clinical Narrative: TOC Brief Assessment completed. No TOC needs identified at this time    Transition of Care Asessment: Insurance and Status: Insurance coverage has been reviewed Patient has primary care physician: Yes Home environment has been reviewed: Home with support from family Prior level of function:: Independent Prior/Current Home Services: No current home services Social Determinants of Health Reivew: SDOH reviewed no interventions necessary Readmission risk has been reviewed: Yes Transition of care needs: no transition of care needs at this time

## 2022-12-06 ENCOUNTER — Encounter: Payer: Self-pay | Admitting: Gynecologic Oncology

## 2022-12-06 ENCOUNTER — Inpatient Hospital Stay (HOSPITAL_COMMUNITY): Payer: BC Managed Care – PPO

## 2022-12-06 DIAGNOSIS — R7881 Bacteremia: Secondary | ICD-10-CM

## 2022-12-06 DIAGNOSIS — K652 Spontaneous bacterial peritonitis: Secondary | ICD-10-CM | POA: Diagnosis not present

## 2022-12-06 LAB — CBC
HCT: 31.9 % — ABNORMAL LOW (ref 36.0–46.0)
Hemoglobin: 10.6 g/dL — ABNORMAL LOW (ref 12.0–15.0)
MCH: 31.3 pg (ref 26.0–34.0)
MCHC: 33.2 g/dL (ref 30.0–36.0)
MCV: 94.1 fL (ref 80.0–100.0)
Platelets: 258 10*3/uL (ref 150–400)
RBC: 3.39 MIL/uL — ABNORMAL LOW (ref 3.87–5.11)
RDW: 15.9 % — ABNORMAL HIGH (ref 11.5–15.5)
WBC: 3.1 10*3/uL — ABNORMAL LOW (ref 4.0–10.5)
nRBC: 0.6 % — ABNORMAL HIGH (ref 0.0–0.2)

## 2022-12-06 LAB — ECHOCARDIOGRAM COMPLETE
AR max vel: 2.99 cm2
AV Area VTI: 2.75 cm2
AV Area mean vel: 2.56 cm2
AV Mean grad: 6 mmHg
AV Peak grad: 10.5 mmHg
Ao pk vel: 1.62 m/s
Area-P 1/2: 5.27 cm2
Height: 66 in
S' Lateral: 3.15 cm
Weight: 2370.39 oz

## 2022-12-06 LAB — BODY FLUID CELL COUNT WITH DIFFERENTIAL
Lymphs, Fluid: 2 %
Monocyte-Macrophage-Serous Fluid: 23 % — ABNORMAL LOW (ref 50–90)
Neutrophil Count, Fluid: 75 % — ABNORMAL HIGH (ref 0–25)
Total Nucleated Cell Count, Fluid: 4406 cu mm — ABNORMAL HIGH (ref 0–1000)

## 2022-12-06 LAB — BASIC METABOLIC PANEL
Anion gap: 9 (ref 5–15)
BUN: 34 mg/dL — ABNORMAL HIGH (ref 6–20)
CO2: 17 mmol/L — ABNORMAL LOW (ref 22–32)
Calcium: 8 mg/dL — ABNORMAL LOW (ref 8.9–10.3)
Chloride: 111 mmol/L (ref 98–111)
Creatinine, Ser: 1.64 mg/dL — ABNORMAL HIGH (ref 0.44–1.00)
GFR, Estimated: 42 mL/min — ABNORMAL LOW (ref >60)
Glucose, Bld: 74 mg/dL (ref 70–99)
Potassium: 3.9 mmol/L (ref 3.5–5.1)
Sodium: 137 mmol/L (ref 135–145)

## 2022-12-06 LAB — CULTURE, BLOOD (ROUTINE X 2)

## 2022-12-06 LAB — MISC LABCORP TEST (SEND OUT)
LabCorp test name: 83935
Labcorp test code: 83935

## 2022-12-06 LAB — BODY FLUID CULTURE W GRAM STAIN

## 2022-12-06 LAB — PATHOLOGIST SMEAR REVIEW

## 2022-12-06 MED ORDER — METOPROLOL TARTRATE 5 MG/5ML IV SOLN
1.2500 mg | Freq: Once | INTRAVENOUS | Status: AC | PRN
  Administered 2022-12-06: 1.3 mg via INTRAVENOUS
  Filled 2022-12-06: qty 5

## 2022-12-06 MED ORDER — VANCOMYCIN HCL IN DEXTROSE 1-5 GM/200ML-% IV SOLN
1000.0000 mg | INTRAVENOUS | Status: DC
  Administered 2022-12-06: 1000 mg via INTRAVENOUS
  Filled 2022-12-06: qty 200

## 2022-12-06 MED ORDER — SODIUM CHLORIDE 0.9 % IV SOLN: INTRAVENOUS | Status: DC

## 2022-12-06 MED ORDER — HYDROMORPHONE HCL 1 MG/ML IJ SOLN
2.0000 mg | INTRAMUSCULAR | Status: DC | PRN
  Administered 2022-12-06: 2 mg via INTRAVENOUS
  Filled 2022-12-06: qty 2

## 2022-12-06 MED ORDER — ALBUMIN HUMAN 25 % IV SOLN: 50.0000 g | Freq: Once | INTRAVENOUS | Status: DC

## 2022-12-06 MED ORDER — LIDOCAINE HCL 1 % IJ SOLN
INTRAMUSCULAR | Status: AC
  Filled 2022-12-06: qty 20

## 2022-12-06 MED ORDER — HYDROMORPHONE HCL 1 MG/ML IJ SOLN
2.0000 mg | INTRAMUSCULAR | Status: DC | PRN
  Administered 2022-12-06 – 2022-12-07 (×6): 2 mg via INTRAVENOUS
  Administered 2022-12-07 – 2022-12-09 (×14): 3 mg via INTRAVENOUS
  Filled 2022-12-06 (×7): qty 3
  Filled 2022-12-06: qty 2
  Filled 2022-12-06: qty 3
  Filled 2022-12-06 (×3): qty 2
  Filled 2022-12-06 (×3): qty 3
  Filled 2022-12-06: qty 2
  Filled 2022-12-06 (×3): qty 3
  Filled 2022-12-06: qty 2
  Filled 2022-12-06: qty 3

## 2022-12-06 NOTE — Procedures (Signed)
Ultrasound-guided diagnostic and therapeutic paracentesis performed yielding 2.3 liters of dark brown  fluid. No immediate complications. The fluid was sent to the lab for preordered studies. EBL none.

## 2022-12-06 NOTE — Progress Notes (Signed)
PROGRESS NOTE    Angelica Blankenship  ZOX:[id number] DOB: 01/02/1991 DOA: 12/04/2022 PCP: Marcine Matar, MD    Brief Narrative:  Angelica Blankenship is a 32 y.o. female with medical history significant of ascites, endometriosis, adnexal mass, recurrent ascites with last paracentesis in February this year, headache, asymptomatic heart murmur, hypertension, pulmonary nodule, normocytic anemia who is returning to the hospital for the second day in a row due to abdominal pain.  She was offered to be admitted yesterday, felt better and went home.  However, the hydrocodone that she was prescribed did not help.  She had an episode of emesis several days ago and 1 episode of emesis today.  4L removed in ER via paracentesis.  Culture grew MRSA.  ID consult placed.     Assessment and Plan: Spontaneous bacterial peritonitis (HCC) (ascites from endometriosis) Acetaminophen 650 mg every 6 hours as needed. -pain control with IV dilaudid Antiemetics as needed. -abx changed to vacn due to culture showing MRSA -repeat paracentesis for symptoms-- culture/cytology ordered  AKI -IVF -hold toradol    Endometriosis No need for GYN at the moment per GYN on-call. (Dr. Duane Lope)-  reached out to doc at [facility] but she is on PAL -restart hormonal treatment     Essential hypertension -BB started in ER    Normocytic anemia Monitor hematocrit and hemoglobin. Transfuse as needed.     Hypokalemia Replace     Hypomagnesemia/hypophosphatemia  -replete     DVT prophylaxis: SCDs Start: 12/04/22 1254    Code Status: Full Code Family Communication: at bedside  Disposition Plan:  Level of care: Progressive Status is: Inpatient Remains inpatient appropriate     Consultants:  none    Subjective: Pain better with increased in pain meds-- still hurts with movement  Objective: Vitals:   12/06/22 0650 12/06/22 0800 12/06/22 0900 12/06/22 1153  BP:  126/85  117/82  Pulse: (!) 118 (!) 123 (!) 155    Resp: (!) 28 (!) 25    Temp:  98.1 F (36.7 C)    TempSrc:  Oral    SpO2: 100% 100%    Weight:      Height:        Intake/Output Summary (Last 24 hours) at 12/06/2022 1209 Last data filed at 12/06/2022 0400 Gross per 24 hour  Intake 1483.71 ml  Output --  Net 1483.71 ml   Filed Weights   12/04/22 1700  Weight: 67.2 kg    Examination:   General: Appearance:    Well developed, well nourished female in no acute distress     Lungs:     respirations unlabored  Heart:    Tachycardic.  Abdomen distended  MS:   All extremities are intact.    Neurologic:   Awake, alert, oriented x 3. No apparent focal neurological           defect.        Data Reviewed: I have personally reviewed following labs and imaging studies  CBC: Recent Labs  Lab 12/03/22 1233 12/03/22 1242 12/04/22 0740 12/05/22 0405 12/05/22 0807 12/06/22 0841  WBC 9.2  --  4.8 QUESTIONABLE RESULTS, RECOMMEND RECOLLECT TO VERIFY 1.8* 3.1*  NEUTROABS 8.6*  --  4.3  --   --   --   HGB 12.2 12.9 11.5* 9.1* 9.1* 10.6*  HCT 36.4 38.0 34.3* 27.0* 26.8* 31.9*  MCV 95.0  --  94.2 93.8 93.1 94.1  PLT 326  --  314 247 254 258   Basic  Metabolic Panel: Recent Labs  Lab 12/03/22 1233 12/03/22 1242 12/04/22 0740 12/04/22 1817 12/05/22 0402 12/05/22 0405 12/06/22 0841  NA 134* 139 135  --   --  134* 137  K 3.3* 3.3* 3.3*  --   --  4.2 3.9  CL 108 108 107  --   --  108 111  CO2 16*  --  17*  --   --  16* 17*  GLUCOSE 114* 119* 127*  --   --  115* 74  BUN 9 7 15   --   --  19 34*  CREATININE 0.75 0.80 0.89  --   --  1.15* 1.64*  CALCIUM 8.7*  --  8.6*  --   --  8.0* 8.0*  MG  --   --   --  1.6* 2.3  --   --   PHOS  --   --   --  1.0* 3.5  --   --    GFR: Estimated Creatinine Clearance: 45.7 mL/min (A) (by C-G formula based on SCr of 1.64 mg/dL (H)). Liver Function Tests: Recent Labs  Lab 12/03/22 1233 12/04/22 0740 12/05/22 0405  AST 16 14* 18  ALT 18 14 13   ALKPHOS 39 31* 17*  BILITOT 0.6 0.5 1.2   PROT 8.0 7.9 6.6  ALBUMIN 3.7 3.5 4.0   Recent Labs  Lab 12/03/22 1233 12/04/22 0740  LIPASE 27 23   No results for input(s): "AMMONIA" in the last 168 hours. Coagulation Profile: No results for input(s): "INR", "PROTIME" in the last 168 hours. Cardiac Enzymes: No results for input(s): "CKTOTAL", "CKMB", "CKMBINDEX", "TROPONINI" in the last 168 hours. BNP (last 3 results) No results for input(s): "PROBNP" in the last 8760 hours. HbA1C: No results for input(s): "HGBA1C" in the last 72 hours. CBG: No results for input(s): "GLUCAP" in the last 168 hours. Lipid Profile: No results for input(s): "CHOL", "HDL", "LDLCALC", "TRIG", "CHOLHDL", "LDLDIRECT" in the last 72 hours. Thyroid Function Tests: No results for input(s): "TSH", "T4TOTAL", "FREET4", "T3FREE", "THYROIDAB" in the last 72 hours. Anemia Panel: No results for input(s): "VITAMINB12", "FOLATE", "FERRITIN", "TIBC", "IRON", "RETICCTPCT" in the last 72 hours. Sepsis Labs: Recent Labs  Lab 12/04/22 1818 12/04/22 2053  LATICACIDVEN 1.7 1.5    Recent Results (from the past 240 hour(s))  Body fluid culture w Gram Stain     Status: None   Collection Time: 12/04/22 10:09 AM   Specimen: Peritoneal Washings; Body Fluid  Result Value Ref Range Status   Specimen Description   Final    PERITONEAL Performed at [facility], [address] [place], [place] [address]    Special Requests   Final    NONE Performed at [facility], [address] [place], [place] [address]    Gram Stain   Final    FEW WBC PRESENT, PREDOMINANTLY PMN RED BLOOD CELLS PRESENT NO ORGANISMS SEEN    Culture   Final    FEW METHICILLIN RESISTANT STAPHYLOCOCCUS AUREUS CRITICAL RESULT CALLED TO, READ BACK BY AND VERIFIED WITH: D. DARK RN, AT 1054 12/06/22 D. Leighton Roach REGARDING CULTURE GROWTH Performed at [facility], [address], [place], [place] [address]    Report Status 12/06/2022 FINAL  Final    Organism ID, Bacteria METHICILLIN RESISTANT STAPHYLOCOCCUS AUREUS  Final      Susceptibility   Methicillin resistant staphylococcus aureus - MIC*    CIPROFLOXACIN >=8 RESISTANT Resistant     ERYTHROMYCIN >=8 RESISTANT Resistant  GENTAMICIN <=0.5 SENSITIVE Sensitive     OXACILLIN >=4 RESISTANT Resistant     TETRACYCLINE >=16 RESISTANT Resistant     VANCOMYCIN <=0.5 SENSITIVE Sensitive     TRIMETH/SULFA >=320 RESISTANT Resistant     CLINDAMYCIN <=0.25 SENSITIVE Sensitive     RIFAMPIN <=0.5 SENSITIVE Sensitive     Inducible Clindamycin NEGATIVE Sensitive     LINEZOLID 2 SENSITIVE Sensitive     * FEW METHICILLIN RESISTANT STAPHYLOCOCCUS AUREUS         Radiology Studies: No results found.      Scheduled Meds:  docusate sodium  100 mg Oral BID   metoprolol succinate  25 mg Oral Daily   norethindrone  5 mg Oral BID   Continuous Infusions:  sodium chloride 75 mL/hr at 12/06/22 1059   albumin human     [START ON 12/07/2022] vancomycin       LOS: 2 days    Time spent: 45 minutes spent on chart review, discussion with nursing staff, consultants, updating family and interview/physical exam; more than 50% of that time was spent in counseling and/or coordination of care.    Joseph Art, DO [facility] Available via Epic secure chat 7am-7pm After these hours, please refer to coverage provider listed on [email or web address] 12/06/2022, 12:09 PM

## 2022-12-06 NOTE — Progress Notes (Signed)
  Echocardiogram 2D Echocardiogram has been performed.  Helmut Hennon Wynn Banker 12/06/2022, 2:23 PM

## 2022-12-06 NOTE — Progress Notes (Signed)
Pharmacy Antibiotic Note  CHETARA THADEN is a 32 y.o. female admitted on 12/04/2022 with  SBP .  Pharmacy has been consulted for Vanco dosing.  ID: Vanc for SBP Tmax 99.8, WBC 1.8>3.1, Scr increased to 1.64  6/2 peritoneal culture: staph aureus   6/2 CTX> 6/3 6/3 Vanc>   Plan: Change Vanco to 1g IV q24h for AUC 488, Scr 1.64    Height: 5\' 6"  (167.6 cm) Weight: 67.2 kg (148 lb 2.4 oz) IBW/kg (Calculated) : 59.3  Temp (24hrs), Avg:98.8 F (37.1 C), Min:98.1 F (36.7 C), Max:99.8 F (37.7 C)  Recent Labs  Lab 12/03/22 1233 12/03/22 1242 12/04/22 0740 12/04/22 1818 12/04/22 2053 12/05/22 0405 12/05/22 0807 12/06/22 0841  WBC 9.2  --  4.8  --   --  QUESTIONABLE RESULTS, RECOMMEND RECOLLECT TO VERIFY 1.8* 3.1*  CREATININE 0.75 0.80 0.89  --   --  1.15*  --  1.64*  LATICACIDVEN  --   --   --  1.7 1.5  --   --   --     Estimated Creatinine Clearance: 45.7 mL/min (A) (by C-G formula based on SCr of 1.64 mg/dL (H)).    No Known Allergies  Truda Staub S. Merilynn Finland, PharmD, BCPS Clinical Staff Pharmacist [email or web address]  Misty Stanley Stillinger 12/06/2022 10:00 AM

## 2022-12-06 NOTE — Consult Note (Signed)
[facility]    Date of Admission:  12/04/2022     Reason for Consult: mrsa bacterascites    Referring Provider: Marlin Canary     Lines:  Peripheral iv's  Abx: 6/3-c vanc  6/02-3 ceftriaxone        Assessment: 32 yo female with adnexal mass, endometriosis on hormone therapy, recurrent ascites, htn, pulm nodule, admitted 12/04/22  for acute on chronic abd pain found to have sepsis and mrsa bacteriascites. 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 Her sx duration is very short 1-2 days so I do not see benefit of mri at this time If persistent in follow up will reconsider Continue vancomycin for now Discussed with primary team      ------------------------------------------------ Principal Problem:   Spontaneous bacterial peritonitis (HCC) Active Problems:   Endometriosis   Essential hypertension   Normocytic anemia   Hypokalemia   Hypomagnesemia    HPI: Angelica Blankenship is a 32 y.o. female with adnexal mass, endometriosis on hormone therapy, recurrent ascites, htn, pulm nodule, admitted 12/04/22  for acute on chronic abd pain found to have sepsis and mrsa bacteriascites  Patient with recurrent ascites and chronic abd pain in setting endometriosis went to  ED 6/1 for 2 days increased abd pain and a few days of intermittent n/v  Patient was seen in ed the day prior to admission but refused admission as she felt better, but returned within 24 hours for worsening abd pain  Intermittent n/v the past few days  Has had paracentesis in the past (last one 08/2022); cytology done --> no malignant cells, mesothelial cells and histiocytes seen  Denies ivdu Denies dysuria/frequency/urgency; diarrhea Denies f/c, nightsweat, weight loss Denies focal joint/back pain; rash; cough, dyspnea  On presentation afebrile, but fever within 24 hours admission Wbc low 1s to 3s and nrbc seen on peripheral smear Normal ft Cr increased this admission after initial normal value  She has been seeing gynecology outpatient I reviewed 04/2020 note from gyn: -had surgery 03/2020 --> drainage of ascites, exploratory laparoscopy, and biopsy of periumbilical nodule.  Pathology was consistent with endometriosis [facility] referral previously for chronic pelvic pain and endometriosis --> recommended Lupron and fertility discussion  From 11/24/22 [facility] gynecology visit -[id number] -mri 11/08/22 extensive endometriosis with involvement umbilicus and rectum. Has been on norerhindrone and dose was increased -discussion conservative treatment not adequate and ?further surgery might be needed -seen by [facility] in past and desired to be referred to [facility] to further assess options   She has been seeing [facility] as well who help coordinate paracentesis and hormone treatment   She does report initially upper thoracic back then lumbar pain with the abd pain but she said these pain do come and go with her  endometriosis  She also said the umbillicus wound has been draining but nothing abnormal since its first onset from previous 2021 surgery  Abd pelv ct this admission without demonstration abscess at umbillicus  Denies ivdu Does have niple/tongue ring but  nothing placed recently  Family History  Problem Relation Age of Onset   Hypertension Mother    Hypertension Sister    Breast cancer Maternal Aunt    Hypertension Maternal Grandmother    Cervical cancer Maternal Grandmother    Ovarian cancer Neg Hx    Uterine cancer Neg Hx    Colon cancer Neg Hx     Social History   Tobacco Use   Smoking status: Former    Packs/day: 0.10    Years: 8.00    Additional pack years: 0.00    Total pack years: 0.80    Types: Cigars, Cigarettes    Quit date: 12/02/2016    Years since quitting: 6.0   Smokeless tobacco: Never  Vaping Use   Vaping Use: Never used  Substance Use Topics   Alcohol use: Yes    Comment: occasional   Drug use: Yes    Frequency: 35.0 times per week    Types: Marijuana    Comment: uses daily    No Known Allergies  Review of Systems: ROS All Other ROS was negative, except mentioned above   Past Medical History:  Diagnosis Date   Ascites    Endometriosis    Headache    Heart murmur    asymptomatic   Hypertension    No medications prescribed       Scheduled Meds:  docusate sodium  100 mg Oral BID   metoprolol succinate  25 mg Oral Daily   norethindrone  5 mg Oral BID   Continuous Infusions:  sodium chloride 75 mL/hr at 12/06/22 1059   albumin human     [START ON 12/07/2022] vancomycin     PRN Meds:.acetaminophen **OR** acetaminophen, diphenhydrAMINE, HYDROmorphone (DILAUDID) injection, ondansetron **OR** ondansetron (ZOFRAN) IV, oxyCODONE, polyethylene glycol   OBJECTIVE: Blood pressure 126/85, pulse (!) 155, temperature 98.1 F (36.7 C), temperature source Oral, resp. rate (!) 25, height 5\' 6"  (1.676 m), weight 67.2 kg, SpO2 100 %, unknown if currently breastfeeding.  Physical Exam  General/constitutional: no distress, pleasant HEENT: Normocephalic, PER, Conj Clear, EOMI, Oropharynx clear -- tongue ring visualized Neck supple CV: rrr no mrg Lungs: clear to auscultation, normal respiratory  effort Abd: Soft, Nontender Ext: no edema Skin: No Rash -- periumbillucus hyperpigmentation and slight serous crusting without tenderness/fluctuance/tenderness Neuro: nonfocal MSK: no peripheral joint swelling/tenderness/warmth; back spines nontender   Lab Results Lab Results  Component Value Date   WBC 3.1 (L) 12/06/2022   HGB 10.6 (L) 12/06/2022   HCT 31.9 (L) 12/06/2022   MCV 94.1 12/06/2022   PLT 258 12/06/2022    Lab Results  Component Value Date   CREATININE 1.64 (H) 12/06/2022   BUN 34 (H) 12/06/2022   NA 137 12/06/2022   K 3.9 12/06/2022   CL 111 12/06/2022   CO2 17 (L) 12/06/2022    Lab Results  Component Value Date   ALT 13 12/05/2022   AST 18 12/05/2022   ALKPHOS 17 (L) 12/05/2022   BILITOT 1.2 12/05/2022      Microbiology: Recent Results (from the past 240 hour(s))  Body fluid culture w Gram Stain     Status: None   Collection Time: 12/04/22 10:09 AM   Specimen: Peritoneal Washings; Body Fluid  Result Value Ref Range  Status   Specimen Description   Final    PERITONEAL Performed at [facility], [address] [place], [place] [address]    Special Requests   Final    NONE Performed at [facility], [address] [place], [place] [address]    Gram Stain   Final    FEW WBC PRESENT, PREDOMINANTLY PMN RED BLOOD CELLS PRESENT NO ORGANISMS SEEN    Culture   Final    FEW METHICILLIN RESISTANT STAPHYLOCOCCUS AUREUS CRITICAL RESULT CALLED TO, READ BACK BY AND VERIFIED WITH: D. DARK RN, AT 1054 12/06/22 D. Leighton Roach REGARDING CULTURE GROWTH Performed at [facility], [address] [place], [place] [address]    Report Status 12/06/2022 FINAL  Final   Organism ID, Bacteria METHICILLIN RESISTANT STAPHYLOCOCCUS AUREUS  Final      Susceptibility   Methicillin resistant staphylococcus aureus - MIC*    CIPROFLOXACIN >=8 RESISTANT Resistant     ERYTHROMYCIN >=8 RESISTANT Resistant     GENTAMICIN <=0.5 SENSITIVE  Sensitive     OXACILLIN >=4 RESISTANT Resistant     TETRACYCLINE >=16 RESISTANT Resistant     VANCOMYCIN <=0.5 SENSITIVE Sensitive     TRIMETH/SULFA >=320 RESISTANT Resistant     CLINDAMYCIN <=0.25 SENSITIVE Sensitive     RIFAMPIN <=0.5 SENSITIVE Sensitive     Inducible Clindamycin NEGATIVE Sensitive     LINEZOLID 2 SENSITIVE Sensitive     * FEW METHICILLIN RESISTANT STAPHYLOCOCCUS AUREUS     Serology:    Imaging: If present, new imagings (plain films, ct scans, and mri) have been personally visualized and interpreted; radiology reports have been reviewed. Decision making incorporated into the Impression / Recommendations.   12/03/22 abd pelv ct 1. Recurrent large volume ascites throughout the abdomen measuring slightly greater than simple fluid. Findings are nonspecific and maybe related to patient's history of endometriosis. 2. Otherwise, no acute abdominopelvic findings.      Raymondo Band, MD [facility] [phone] pager    12/06/2022, 11:00 AM

## 2022-12-07 ENCOUNTER — Other Ambulatory Visit (HOSPITAL_COMMUNITY): Payer: Self-pay

## 2022-12-07 ENCOUNTER — Telehealth (HOSPITAL_COMMUNITY): Payer: Self-pay | Admitting: Pharmacy Technician

## 2022-12-07 ENCOUNTER — Encounter: Payer: Self-pay | Admitting: Gynecologic Oncology

## 2022-12-07 DIAGNOSIS — A4102 Sepsis due to Methicillin resistant Staphylococcus aureus: Secondary | ICD-10-CM | POA: Diagnosis not present

## 2022-12-07 DIAGNOSIS — I10 Essential (primary) hypertension: Secondary | ICD-10-CM

## 2022-12-07 DIAGNOSIS — K652 Spontaneous bacterial peritonitis: Secondary | ICD-10-CM | POA: Diagnosis not present

## 2022-12-07 DIAGNOSIS — N809 Endometriosis, unspecified: Secondary | ICD-10-CM

## 2022-12-07 DIAGNOSIS — D649 Anemia, unspecified: Secondary | ICD-10-CM

## 2022-12-07 DIAGNOSIS — R6520 Severe sepsis without septic shock: Secondary | ICD-10-CM | POA: Diagnosis not present

## 2022-12-07 DIAGNOSIS — N179 Acute kidney failure, unspecified: Secondary | ICD-10-CM | POA: Diagnosis not present

## 2022-12-07 LAB — BODY FLUID CULTURE W GRAM STAIN

## 2022-12-07 LAB — URINALYSIS, ROUTINE W REFLEX MICROSCOPIC
Bilirubin Urine: NEGATIVE
Glucose, UA: NEGATIVE mg/dL
Ketones, ur: 5 mg/dL — AB
Leukocytes,Ua: NEGATIVE
Nitrite: NEGATIVE
Protein, ur: 30 mg/dL — AB
Specific Gravity, Urine: 1.013 (ref 1.005–1.030)
pH: 6 (ref 5.0–8.0)

## 2022-12-07 LAB — BASIC METABOLIC PANEL
Anion gap: 13 (ref 5–15)
BUN: 44 mg/dL — ABNORMAL HIGH (ref 6–20)
CO2: 14 mmol/L — ABNORMAL LOW (ref 22–32)
Calcium: 8.1 mg/dL — ABNORMAL LOW (ref 8.9–10.3)
Chloride: 108 mmol/L (ref 98–111)
Creatinine, Ser: 1.91 mg/dL — ABNORMAL HIGH (ref 0.44–1.00)
GFR, Estimated: 35 mL/min — ABNORMAL LOW (ref >60)
Glucose, Bld: 67 mg/dL — ABNORMAL LOW (ref 70–99)
Potassium: 4.6 mmol/L (ref 3.5–5.1)
Sodium: 135 mmol/L (ref 135–145)

## 2022-12-07 LAB — CBC
HCT: 25.8 % — ABNORMAL LOW (ref 36.0–46.0)
Hemoglobin: 8.3 g/dL — ABNORMAL LOW (ref 12.0–15.0)
MCH: 30.6 pg (ref 26.0–34.0)
MCHC: 32.2 g/dL (ref 30.0–36.0)
MCV: 95.2 fL (ref 80.0–100.0)
Platelets: 282 10*3/uL (ref 150–400)
RBC: 2.71 MIL/uL — ABNORMAL LOW (ref 3.87–5.11)
RDW: 16.4 % — ABNORMAL HIGH (ref 11.5–15.5)
WBC: 4.3 10*3/uL (ref 4.0–10.5)
nRBC: 0.9 % — ABNORMAL HIGH (ref 0.0–0.2)

## 2022-12-07 LAB — CULTURE, BLOOD (ROUTINE X 2): Special Requests: ADEQUATE

## 2022-12-07 LAB — CREATININE, URINE, RANDOM: Creatinine, Urine: 84 mg/dL

## 2022-12-07 LAB — SODIUM, URINE, RANDOM: Sodium, Ur: <10 mmol/L

## 2022-12-07 LAB — CYTOLOGY - NON PAP

## 2022-12-07 MED ORDER — LACTATED RINGERS IV SOLN: INTRAVENOUS | Status: AC

## 2022-12-07 MED ORDER — HYDROCORTISONE 1 % EX CREA
1.0000 | TOPICAL_CREAM | Freq: Three times a day (TID) | CUTANEOUS | Status: DC | PRN
  Administered 2022-12-08: 1 via TOPICAL
  Filled 2022-12-07: qty 28

## 2022-12-07 MED ORDER — SODIUM CHLORIDE 0.9 % IV SOLN
500.0000 mg | Freq: Every day | INTRAVENOUS | Status: DC
  Administered 2022-12-07 – 2022-12-10 (×4): 500 mg via INTRAVENOUS
  Filled 2022-12-07 (×4): qty 10

## 2022-12-07 NOTE — Consult Note (Signed)
Reason for Consult: AKI Referring Physician:  Sharl Ma, MD  Angelica Blankenship is an 32 y.o. female has a PMH significant for HTN, stage IV endometriosis with recurrent ascites who presented to [facility] on 12/03/22 with a 2 day history of lower abdominal pain, nausea, vomiting, and diarrhea.  In the ED, Temp 98.6, HR 125, BP 160/94, SpO2, RR 18.  Labs were notable for K 3.3, Co2 17, negative hCG, UA with specific gravity of 1.046, small blood, > 300 protein.  She underwent CT of abdomen with IV contrast which showed recurrent large volume ascites.  She then had a paracentesis of 4 Liters on 12/04/22 which was bloody and cell count of 7,673, LDH 1,245, glucose <20, protein 3.9.  She was admitted for SBP and IV antibiotics.  The culture grew few MSRA and ceftriaxone and azithromycin were changed to Vancomycin on 12/05/22.  She also received ketorolac 30 mg IVP every 6 hours prn.  She had another paracentesis of 2.3 L on 12/06/22.  We were consulted after she had worsening AKI.  The trend is seen below.  Of note, she received 4 doses of vancomycin, then switched to daptomycin today.  Trend in Creatinine: Creatinine, Ser  Date/Time Value Ref Range Status  12/07/2022 03:58 AM 1.91 (H) 0.44 - 1.00 mg/dL Final  65/78/4696 29:52 AM 1.64 (H) 0.44 - 1.00 mg/dL Final  84/13/2440 10:27 AM 1.15 (H) 0.44 - 1.00 mg/dL Final  25/36/6440 34:74 AM 0.89 0.44 - 1.00 mg/dL Final  25/95/6387 56:43 PM 0.80 0.44 - 1.00 mg/dL Final  32/95/1884 16:60 PM 0.75 0.44 - 1.00 mg/dL Final  63/07/6008 93:23 AM 0.81 0.44 - 1.00 mg/dL Final  55/73/2202 54:27 PM 0.73 0.44 - 1.00 mg/dL Final  12/25/7626 31:51 AM 0.6 0.4 - 1.2 mg/dL Final    PMH:   Past Medical History:  Diagnosis Date   Ascites    Endometriosis    Headache    Heart murmur    asymptomatic   Hypertension    No medications prescribed    PSH:   Past Surgical History:  Procedure Laterality Date   IR PARACENTESIS  01/01/2020   IR RADIOLOGIST EVAL & MGMT  04/14/2022    LAPAROSCOPY N/A 06/18/2015   Procedure: LAPAROSCOPY DIAGNOSTIC;  Surgeon: Maxie Better, MD;  Location: [facility];  Service: Gynecology;  Laterality: N/A;  90 min. requested   LAPAROSCOPY N/A 03/19/2020   Procedure: LAPAROSCOPY DIAGNOSTIC WITH ROBOTIC ASSISTANCE AND  BIOPSIES;  Surgeon: Carver Fila, MD;  Location: [facility];  Service: Gynecology;  Laterality: N/A;   ROBOTIC ASSISTED DIAGNOSTIC LAPAROSCOPY N/A 06/18/2015   Procedure:  ROBOTIC ASSISTED DIAGNOSTIC LAPAROSCOPY WITH EXCISION OF PELVIC ENDOMETRIOSIS and lysis of adhesions, chromopertubation;  Surgeon: Maxie Better, MD;  Location: [facility];  Service: Gynecology;  Laterality: N/A;    Allergies: No Known Allergies  Medications:   Prior to Admission medications   Medication Sig Start Date End Date Taking? Authorizing Provider  HYDROcodone-acetaminophen (NORCO/VICODIN) 5-325 MG tablet Take 2 tablets by mouth every 4 (four) hours as needed. 12/03/22  Yes Gareth Eagle, PA-C  ibuprofen (ADVIL) 800 MG tablet Take 1 tablet (800 mg total) by mouth every 8 (eight) hours as needed for moderate pain. For AFTER surgery only 04/06/20  Yes Cross, [name] D, NP  metroNIDAZOLE (METROGEL) 0.75 % vaginal gel Place 1 Applicatorful vaginally at bedtime.   Yes [provider]  Multiple Vitamin (MULTIVITAMIN WITH MINERALS) TABS tablet Take 1 tablet by mouth daily.   Yes [provider]  acetaminophen (TYLENOL) 500 MG tablet Take 1,000 mg by mouth every 6 (six) hours as needed for moderate pain.  Patient not taking: Reported on 12/04/2022    [provider]  aspirin-acetaminophen-caffeine (EXCEDRIN MIGRAINE) [phone] MG tablet Take 1 tablet by mouth every 6 (six) hours as needed for headache. Patient not taking: Reported on 12/04/2022    [provider]  senna-docusate (SENOKOT-S) 8.6-50 MG tablet Take 2 tablets by mouth at bedtime. For AFTER surgery, do not take if having diarrhea Patient not taking: Reported on  12/04/2022 03/11/20   Warner Mccreedy D, NP    Inpatient medications:  docusate sodium  100 mg Oral BID   metoprolol succinate  25 mg Oral Daily   norethindrone  5 mg Oral BID    Discontinued Meds:   Medications Discontinued During This Encounter  Medication Reason   lactated ringers bolus 1,000 mL    ketorolac (TORADOL) 30 MG/ML injection 30 mg    ketorolac (TORADOL) 30 MG/ML injection 30 mg    cefTRIAXone (ROCEPHIN) 2 g in sodium chloride 0.9 % 100 mL IVPB    vancomycin (VANCOREADY) IVPB 750 mg/150 mL Dose change   ketorolac (TORADOL) 30 MG/ML injection 30 mg    HYDROmorphone (DILAUDID) injection 2 mg    albumin human 25 % solution 50 g    vancomycin (VANCOCIN) IVPB 1000 mg/200 mL premix    0.9 %  sodium chloride infusion     Social History:  reports that she quit smoking about 6 years ago. Her smoking use included cigars and cigarettes. She has a 0.80 pack-year smoking history. She has never used smokeless tobacco. She reports current alcohol use. She reports current drug use. Frequency: 35.00 times per week. Drug: Marijuana.  Family History:   Family History  Problem Relation Age of Onset   Hypertension Mother    Hypertension Sister    Breast cancer Maternal Aunt    Hypertension Maternal Grandmother    Cervical cancer Maternal Grandmother    Ovarian cancer Neg Hx    Uterine cancer Neg Hx    Colon cancer Neg Hx     Pertinent items are noted in HPI. Weight change:   Intake/Output Summary (Last 24 hours) at 12/07/2022 1324 Last data filed at 12/07/2022 0048 Gross per 24 hour  Intake 1385.48 ml  Output --  Net 1385.48 ml   BP (!) 135/95 (BP Location: Left Arm)   Pulse (!) 113   Temp 98.4 F (36.9 C) (Oral)   Resp (!) 22   Ht 5\' 6"  (1.676 m)   Wt 67.2 kg   LMP  (LMP Unknown)   SpO2 97%   BMI 23.91 kg/m  Vitals:   12/07/22 0000 12/07/22 0400 12/07/22 0820 12/07/22 1315  BP: 120/79 127/86 137/88 (!) 135/95  Pulse: (!) 125 (!) 124 (!) 125 (!) 113  Resp: (!) 25 (!)  28 (!) 21 (!) 22  Temp:  98.8 F (37.1 C) 98.4 F (36.9 C) 98.4 F (36.9 C)  TempSrc:  Oral Oral Oral  SpO2: 97% 96% 98% 97%  Weight:      Height:         General appearance: fatigued and no distress Head: Normocephalic, without obvious abnormality, atraumatic Resp: clear to auscultation bilaterally Cardio: tachycardic, no rub GI: distended, +BS, mildly tender to palpation Extremities: extremities normal, atraumatic, no cyanosis or edema  Labs: Basic Metabolic Panel: Recent Labs  Lab 12/03/22 1233 12/03/22 1242 12/04/22 0740 12/04/22 1817 12/05/22 0402 12/05/22 0405 12/06/22 0841 12/07/22  0358  NA 134* 139 135  --   --  134* 137 135  K 3.3* 3.3* 3.3*  --   --  4.2 3.9 4.6  CL 108 108 107  --   --  108 111 108  CO2 16*  --  17*  --   --  16* 17* 14*  GLUCOSE 114* 119* 127*  --   --  115* 74 67*  BUN 9 7 15   --   --  19 34* 44*  CREATININE 0.75 0.80 0.89  --   --  1.15* 1.64* 1.91*  ALBUMIN 3.7  --  3.5  --   --  4.0  --   --   CALCIUM 8.7*  --  8.6*  --   --  8.0* 8.0* 8.1*  PHOS  --   --   --  1.0* 3.5  --   --   --    Liver Function Tests: Recent Labs  Lab 12/03/22 1233 12/04/22 0740 12/05/22 0405  AST 16 14* 18  ALT 18 14 13   ALKPHOS 39 31* 17*  BILITOT 0.6 0.5 1.2  PROT 8.0 7.9 6.6  ALBUMIN 3.7 3.5 4.0   Recent Labs  Lab 12/03/22 1233 12/04/22 0740  LIPASE 27 23   No results for input(s): "AMMONIA" in the last 168 hours. CBC: Recent Labs  Lab 12/03/22 1233 12/03/22 1242 12/04/22 0740 12/05/22 0405 12/05/22 0807 12/06/22 0841 12/07/22 0358  WBC 9.2  --  4.8 QUESTIONABLE RESULTS, RECOMMEND RECOLLECT TO VERIFY 1.8* 3.1* 4.3  NEUTROABS 8.6*  --  4.3  --   --   --   --   HGB 12.2   < > 11.5* 9.1* 9.1* 10.6* 8.3*  HCT 36.4   < > 34.3* 27.0* 26.8* 31.9* 25.8*  MCV 95.0  --  94.2 93.8 93.1 94.1 95.2  PLT 326  --  314 247 254 258 282   < > = values in this interval not displayed.   PT/INR: @LABRCNTIP (inr:5) Cardiac Enzymes: )No results for  input(s): "CKTOTAL", "CKMB", "CKMBINDEX", "TROPONINI" in the last 168 hours. CBG: No results for input(s): "GLUCAP" in the last 168 hours.  Iron Studies: No results for input(s): "IRON", "TIBC", "TRANSFERRIN", "FERRITIN" in the last 168 hours.  Xrays/Other Studies: ECHOCARDIOGRAM COMPLETE  Result Date: 12/06/2022    ECHOCARDIOGRAM REPORT   Patient Name:   ANJANI KILCREASE Date of Exam: 12/06/2022 Medical Rec #:  [id number]        Height:       66.0 in Accession #:    [id number]       Weight:       148.1 lb Date of Birth:  [birth year]         BSA:          1.760 m Patient Age:    32 years         BP:           116/84 mmHg Patient Gender: F                HR:           114 bpm. Exam Location:  Inpatient Procedure: 2D Echo, Color Doppler and Cardiac Doppler Indications:    Bacteremia  History:        Patient has prior history of Echocardiogram examinations, most                 recent 02/09/2020. Arrythmias:Tachycardia; Risk  Factors:Hypertension.  Sonographer:    Lucy Antigua Referring Phys: [phone] JESSICA U VANN IMPRESSIONS  1. Left ventricular ejection fraction, by estimation, is 55 to 60%. The left ventricle has normal function. The left ventricle has no regional wall motion abnormalities. Left ventricular diastolic parameters were normal.  2. Right ventricular systolic function is normal. The right ventricular size is normal.  3. The mitral valve is normal in structure. No evidence of mitral valve regurgitation. No evidence of mitral stenosis.  4. The aortic valve is normal in structure. Aortic valve regurgitation is not visualized. No aortic stenosis is present.  5. The inferior vena cava is normal in size with greater than 50% respiratory variability, suggesting right atrial pressure of 3 mmHg. FINDINGS  Left Ventricle: Left ventricular ejection fraction, by estimation, is 55 to 60%. The left ventricle has normal function. The left ventricle has no regional wall motion abnormalities. The left  ventricular internal cavity size was normal in size. There is  no left ventricular hypertrophy. Left ventricular diastolic parameters were normal. Right Ventricle: The right ventricular size is normal. No increase in right ventricular wall thickness. Right ventricular systolic function is normal. Left [facility]: Left atrial size was normal in size. Right [facility]: Right atrial size was normal in size. Pericardium: There is no evidence of pericardial effusion. Mitral Valve: The mitral valve is normal in structure. There is mild thickening of the anterior mitral valve leaflet(s). No evidence of mitral valve regurgitation. No evidence of mitral valve stenosis. Tricuspid Valve: The tricuspid valve is normal in structure. Tricuspid valve regurgitation is mild . No evidence of tricuspid stenosis. Aortic Valve: The aortic valve is normal in structure. Aortic valve regurgitation is not visualized. No aortic stenosis is present. Aortic valve mean gradient measures 6.0 mmHg. Aortic valve peak gradient measures 10.5 mmHg. Aortic valve area, by VTI measures 2.75 cm. Pulmonic Valve: The pulmonic valve was normal in structure. Pulmonic valve regurgitation is not visualized. No evidence of pulmonic stenosis. Aorta: The aortic root is normal in size and structure. Venous: The inferior vena cava is normal in size with greater than 50% respiratory variability, suggesting right atrial pressure of 3 mmHg. IAS/Shunts: No atrial level shunt detected by color flow Doppler.  LEFT VENTRICLE PLAX 2D LVIDd:         4.60 cm   Diastology LVIDs:         3.15 cm   LV e' medial:    6.74 cm/s LV PW:         1.05 cm   LV E/e' medial:  16.9 LV IVS:        0.85 cm   LV e' lateral:   15.20 cm/s LVOT diam:     2.10 cm   LV E/e' lateral: 7.5 LV SV:         76 LV SV Index:   43 LVOT Area:     3.46 cm  RIGHT VENTRICLE             IVC RV S prime:     13.90 cm/s  IVC diam: 2.00 cm TAPSE (M-mode): 1.3 cm LEFT ATRIUM             Index        RIGHT ATRIUM            Index LA Vol (A2C):   30.9 ml 17.55 ml/m  RA Area:     16.10 cm LA Vol (A4C):   33.7 ml 19.14 ml/m  RA Volume:  42.70 ml  24.26 ml/m LA Biplane Vol: 34.1 ml 19.37 ml/m  AORTIC VALVE AV Area (Vmax):    2.99 cm AV Area (Vmean):   2.56 cm AV Area (VTI):     2.75 cm AV Vmax:           162.00 cm/s AV Vmean:          111.000 cm/s AV VTI:            0.277 m AV Peak Grad:      10.5 mmHg AV Mean Grad:      6.0 mmHg LVOT Vmax:         140.00 cm/s LVOT Vmean:        81.900 cm/s LVOT VTI:          0.220 m LVOT/AV VTI ratio: 0.79  AORTA Ao Root diam: 2.80 cm Ao Asc diam:  2.60 cm MITRAL VALVE                TRICUSPID VALVE MV Area (PHT): 5.27 cm     TR Peak grad:   21.7 mmHg MV Decel Time: 144 msec     TR Vmax:        233.00 cm/s MV E velocity: 114.00 cm/s MV A velocity: 63.70 cm/s   SHUNTS MV E/A ratio:  1.79         Systemic VTI:  0.22 m                             Systemic Diam: 2.10 cm Aditya Sabharwal Electronically signed by Dorthula Nettles Signature Date/Time: 12/06/2022/2:33:51 PM    Final    US Paracentesis  Result Date: 12/06/2022 INDICATION: Patient with history of endometriosis, abdominal pain, recurrent ascites, spontaneous bacterial peritonitis; request received for diagnostic and therapeutic paracentesis EXAM: ULTRASOUND GUIDED DIAGNOSTIC AND THERAPEUTIC PARACENTESIS MEDICATIONS: 8 mL 1% lidocaine COMPLICATIONS: None immediate. PROCEDURE: Informed written consent was obtained from the patient after a discussion of the risks, benefits and alternatives to treatment. A timeout was performed prior to the initiation of the procedure. Initial ultrasound scanning demonstrates a moderate amount of ascites within the right upper to mid abdominal quadrant. The right upper to mid abdomen was prepped and draped in the usual sterile fashion. 1% lidocaine was used for local anesthesia. Following this, a 19 gauge, 10-cm, Yueh catheter was introduced. An ultrasound image was saved for documentation purposes. The  paracentesis was performed. The catheter was removed and a dressing was applied. The patient tolerated the procedure well without immediate post procedural complication. FINDINGS: A total of approximately 2.3 liters of dark brown fluid was removed. Samples were sent to the laboratory as requested by the clinical team. The ascitic fluid was multiseptated. IMPRESSION: Successful ultrasound-guided diagnostic and therapeutic paracentesis yielding 2.3 liters of peritoneal fluid. Performed by: Artemio Aly Electronically Signed   By: Acquanetta Belling M.D.   On: 12/06/2022 13:35     Assessment/Plan:  AKI - multifactorial with preceding volume depletion then complicated by IV contrast, 2 large volume paracenteses, IV NSAID, and IV vancomycin.  She reports that she is making urine but none documented.  Will place order for strict I's/O's.  Continue with IVF's for now.  No indication for dialysis at this time.  Will continue to follow.  Hold off on any further paracenteses until her renal function starts to improve.  Avoid nephrotoxic medications including NSAIDs and iodinated intravenous contrast exposure unless the latter is absolutely indicated.  Preferred narcotic agents for pain control are hydromorphone, fentanyl, and methadone. Morphine should not be used.  Avoid Baclofen and avoid oral sodium phosphate and magnesium citrate based laxatives / bowel preps.  Continue strict Input and Output monitoring.  Will monitor the patient closely with you and intervene or adjust therapy as indicated by changes in clinical status/labs   Metabolic acidosis - due to #1.  Currently on lactated ringers, if Co2 doesn't increase change to isotonic bicarb and follow.  MRSA SBP - ID following.  TTE negative.  Vanco switched to Daptomycin. Recurrent ascites due to stage IV endometriosis - would limit paracenteses as this only exacerbates her AKI. HTN - stable. Normocytic anemia - transfuse prn    Irena Cords 12/07/2022, 1:24 PM

## 2022-12-07 NOTE — Telephone Encounter (Signed)
Patient Advocate Encounter   Received notification that prior authorization for Linezolid 600MG  tablets is required.   PA submitted on 12/07/2022 [facility]  Insurance [employer] Electronic PA Form Status is pending       Roland Earl, CPhT Pharmacy Patient Advocate Specialist [facility] Patient Advocate Team Direct Number: [phone]  Fax: [phone]

## 2022-12-07 NOTE — Progress Notes (Signed)
[facility]  PROGRESS NOTE  Angelica Blankenship WUJ:[id number] DOB: 1990-07-13 DOA: 12/04/2022 PCP: Marcine Matar, MD   Brief HPI:   32 year old female with history of esophagus, endometriosis, adnexal mass, recurrent ascites with last paracentesis in February this year, asymptomatic heart murmur, hypertension, pulm nodule, normocytic anemia came to hospital for abdominal pain.  4 L of fluid was removed in the ER via paracentesis.  Culture grew MRSA, ID was consulted.    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 and also has multiple/complaints, rings placed several months prior which could be site of injury, bacterial translocation to blood -CT abdomen/pelvis unremarkable, no abscess at umbilicus -Ascitic fluid showed few MRSA sensitive to penicillin, 97% neutrophils; blood culture pending -ID plan to treat for 4 weeks antibiotics due to complicated MRSA infection, follow blood culture results if blood culture negative patient does not need TEE -Follow TTE  Acute kidney injury -Creatinine 1.91; currently rising was 1.64 yesterday -Likely from intravascular volume depletion; after paracentesis, emesis -UA shows proteinuria, hematuria -Currently on normal saline at 75 mill per hour -Discussed with nephrology, likely multifactorial from intravascular volume depletion from paracentesis, emesis, Toradol, vancomycin -Will change IV fluids to LR at 100 mm/h, if no improvement in bicarb consider starting isotonic bicarb  History of endometriosis No need for GYN at the moment per GYN on-call. (Dr. Duane Lope)- will reach out to doc at [facility] -restarted hormonal treatment   Essential hypertension -Currently on metoprolol 25 mg daily, started in the ED   Normocytic anemia Monitor hematocrit and hemoglobin. Transfuse as  needed.   Hypokalemia -Replete   Hypomagnesemia/hypophosphatemia  -Replete  Itching -Continue Benadryl as needed    Medications     docusate sodium  100 mg Oral BID   metoprolol succinate  25 mg Oral Daily   norethindrone  5 mg Oral BID     Data Reviewed:   CBG:  No results for input(s): "GLUCAP" in the last 168 hours.  SpO2: 96 % O2 Flow Rate (L/min): 1 L/min    Vitals:   12/06/22 1800 12/06/22 2132 12/07/22 0000 12/07/22 0400  BP: 116/80 132/83 120/79 127/86  Pulse: (!) 122 (!) 128 (!) 125 (!) 124  Resp: (!) 25 (!) 30 (!) 25 (!) 28  Temp: 98.1 F (36.7 C) 98.1 F (36.7 C)  98.8 F (37.1 C)  TempSrc: Oral Oral  Oral  SpO2: 96% 96% 97% 96%  Weight:      Height:          Data Reviewed:  Basic Metabolic Panel: Recent Labs  Lab 12/03/22 1233 12/03/22 1242 12/04/22 0740 12/04/22 1817 12/05/22 0402 12/05/22 0405 12/06/22 0841 12/07/22 0358  NA 134* 139 135  --   --  134* 137 135  K 3.3* 3.3* 3.3*  --   --  4.2 3.9 4.6  CL 108 108 107  --   --  108 111 108  CO2 16*  --  17*  --   --  16* 17* 14*  GLUCOSE 114* 119* 127*  --   --  115* 74 67*  BUN 9 7 15   --   --  19 34* 44*  CREATININE 0.75 0.80 0.89  --   --  1.15* 1.64* 1.91*  CALCIUM 8.7*  --  8.6*  --   --  8.0* 8.0* 8.1*  MG  --   --   --  1.6* 2.3  --   --   --   PHOS  --   --   --  1.0* 3.5  --   --   --     CBC: Recent Labs  Lab 12/03/22 1233 12/03/22 1242 12/04/22 0740 12/05/22 0405 12/05/22 0807 12/06/22 0841 12/07/22 0358  WBC 9.2  --  4.8 QUESTIONABLE RESULTS, RECOMMEND RECOLLECT TO VERIFY 1.8* 3.1* 4.3  NEUTROABS 8.6*  --  4.3  --   --   --   --   HGB 12.2   < > 11.5* 9.1* 9.1* 10.6* 8.3*  HCT 36.4   < > 34.3* 27.0* 26.8* 31.9* 25.8*  MCV 95.0  --  94.2 93.8 93.1 94.1 95.2  PLT 326  --  314 247 254 258 282   < > = values in this interval not displayed.    LFT Recent Labs  Lab 12/03/22 1233 12/04/22 0740 12/05/22 0405  AST 16 14* 18  ALT 18 14 13   ALKPHOS 39 31*  17*  BILITOT 0.6 0.5 1.2  PROT 8.0 7.9 6.6  ALBUMIN 3.7 3.5 4.0     Antibiotics: Anti-infectives (From admission, onward)    Start     Dose/Rate Route Frequency Ordered Stop   12/07/22 1400  DAPTOmycin (CUBICIN) 500 mg in sodium chloride 0.9 % IVPB        500 mg 120 mL/hr over 30 Minutes Intravenous Daily 12/07/22 0804     12/07/22 0000  vancomycin (VANCOCIN) IVPB 1000 mg/200 mL premix  Status:  Discontinued        1,000 mg 200 mL/hr over 60 Minutes Intravenous Every 24 hours 12/06/22 1002 12/07/22 0804   12/06/22 0300  vancomycin (VANCOREADY) IVPB 750 mg/150 mL  Status:  Discontinued        750 mg 150 mL/hr over 60 Minutes Intravenous Every 12 hours 12/05/22 1354 12/06/22 1001   12/05/22 1445  vancomycin (VANCOREADY) IVPB 1250 mg/250 mL        1,250 mg 166.7 mL/hr over 90 Minutes Intravenous  Once 12/05/22 1354 12/05/22 1743   12/04/22 1245  cefTRIAXone (ROCEPHIN) 2 g in sodium chloride 0.9 % 100 mL IVPB  Status:  Discontinued        2 g 200 mL/hr over 30 Minutes Intravenous Every 24 hours 12/04/22 1231 12/05/22 1348        DVT prophylaxis: SCDs  Code Status: Full code  Family Communication: Discussed with patient's mother at bedside   CONSULTS ID   Subjective   Patient seen and examined, complains of itching.  Denies pain.   Objective    Physical Examination:   General: Appears in no acute distress Cardiovascular: S1-S2, regular, no murmur auscultated Respiratory: Lungs clear to auscultation bilaterally Abdomen: Abdomen is soft, nontender, no organomegaly, distended Extremities: No edema in the lower extremities Neurologic: Alert, oriented x 3, no focal deficit noted   Status is: Inpatient:             Meredeth Ide   [facility] If 7PM-7AM, please contact night-coverage at [email or web address], Office  [phone]   12/07/2022, 8:16 AM  LOS: 3 days

## 2022-12-07 NOTE — TOC Benefit Eligibility Note (Signed)
Patient Product/process development scientist completed.    The patient is currently admitted and upon discharge could be taking linezolid (Zyvox) 600 mg tablets.  Requires Prior Authorization  The patient is insured through [employer]   This test claim was processed through [employer]- copay amounts may vary at other pharmacies due to pharmacy/plan contracts, or as the patient moves through the different stages of their insurance plan.  Roland Earl, CPHT Pharmacy Patient Advocate Specialist [facility] Patient Advocate Team Direct Number: [phone]  Fax: [phone]

## 2022-12-07 NOTE — Progress Notes (Signed)
[facility]  Date of Admission:  12/04/2022     Lines:  Peripheral iv's   Abx: 6/3-c vanc   6/02-3 ceftriaxone                                                            Assessment: 32 yo female with adnexal mass, endometriosis on hormone therapy, recurrent ascites, htn, pulm nodule, admitted 12/04/22  for acute on chronic abd pain found to have sepsis and mrsa bacteriascites. 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 and also niple/tongue rings (rings placed several months prior) which could be site of injury/bacterial translocation into the blood   Abd/pelv ct no abscess at umbillicus     6/2 ascites fluid few mrsa (s linezolid; r bactrim, tetra); 7.7k wbc bloody and 97% neutrophilic 6/4 blood cx ngtd   ---------- 12/07/22 assessment Tte no vegetation 6/4 bcx ngtd Patient's kidney function is worse in setting of 5 days ago contrast ct load, toradol this admission, and suspected dehydration and mrsa sepsis (query GN due to mrsa infection; some hematuria reported and pyuria and protein seen on ua)  Leukopenia occurred and some query of potential lupus but today leukopenia had resolved so likely kidney's function due to above consideration  Do not think its related to vancomycin as we just started it. But will switch to daptomycin  Discuss plan in extent with patient and her mother who were at bedside today   Ascites appear to already reaccumulate today -- endometriosis and peritonitis from mrsa   Again previously relate intermittent thoracolumbar back pain with endometriosis/menstruation time and no difference this admission. Will monitor on treatment. Acuity (<2 days of sx) no plan to do mri at this time       Plan: F/u bcx If bcx remains negative I do not have plan to get TEE Plan to treat 4 weeks abx given this is considered complicated metastatic mrsa infection At least 10-14 days iv is planned and longer if needed depending on clinical status Stop vancomycin Start daptomycin Discussed with primary team      Principal Problem:   Spontaneous bacterial peritonitis (HCC) Active Problems:   Endometriosis   Essential hypertension   Normocytic anemia   Hypokalemia   Hypomagnesemia   No Known Allergies  Scheduled Meds:  docusate sodium  100 mg Oral BID   metoprolol succinate  25 mg Oral Daily   norethindrone  5 mg Oral BID   Continuous Infusions:  sodium chloride 75 mL/hr at 12/06/22 2238   DAPTOmycin (CUBICIN) 500 mg in sodium chloride 0.9 % IVPB     PRN Meds:.acetaminophen **OR** acetaminophen, diphenhydrAMINE, HYDROmorphone (DILAUDID) injection, ondansetron **OR** ondansetron (ZOFRAN) IV, oxyCODONE, polyethylene glycol   SUBJECTIVE: Continues to have generalized abd pain Said she has been noticing blood in urine -- no dysuria/flank pain Afebrile  Kidney function worse 6/4 paracentesis continues to show gpc  Tte no obvious vegetation/valve abnormality 6/4 bcx ngtd  Leukopenia had resolved  Review of Systems: ROS All other ROS was negative, except mentioned above  OBJECTIVE: Vitals:   12/06/22 2132 12/07/22 0000 12/07/22 0400 12/07/22 0820  BP: 132/83 120/79 127/86 137/88  Pulse: (!) 128 (!) 125 (!) 124 (!) 125  Resp: (!) 30 (!) 25 (!) 28 (!) 21  Temp: 98.1 F (36.7 C)  98.8 F (37.1 C) 98.4 F (36.9 C)  TempSrc: Oral  Oral Oral  SpO2: 96% 97% 96% 98%  Weight:      Height:       Body mass index is 23.91 kg/m.  Physical Exam  General/constitutional: no distress, pleasant HEENT: Normocephalic, PER, Conj Clear, EOMI, Oropharynx clear Neck supple CV: rrr no mrg Lungs: clear to auscultation, normal respiratory effort Abd: distended;  mildl-moderate tenderness; no rebound; umbillical dressing clean/dry Ext: no edema Skin: No Rash Neuro: nonfocal MSK: no peripheral joint swelling/tenderness/warmth; back spines nontender   Lab Results Lab Results  Component Value Date   WBC 4.3 12/07/2022   HGB 8.3 (L) 12/07/2022   HCT 25.8 (L) 12/07/2022   MCV 95.2 12/07/2022   PLT 282 12/07/2022    Lab Results  Component Value Date   CREATININE 1.91 (H) 12/07/2022   BUN 44 (H) 12/07/2022   NA 135 12/07/2022   K 4.6 12/07/2022   CL 108 12/07/2022   CO2 14 (L) 12/07/2022    Lab Results  Component Value Date   ALT 13 12/05/2022   AST 18 12/05/2022   ALKPHOS 17 (L) 12/05/2022   BILITOT 1.2 12/05/2022      Microbiology: Recent Results (from the past 240 hour(s))  Body fluid culture w Gram Stain     Status: None   Collection Time: 12/04/22 10:09 AM   Specimen: Peritoneal Washings; Body Fluid  Result Value Ref Range Status   Specimen Description   Final    PERITONEAL Performed at [facility], [address], [place], [place] [address]    Special Requests   Final    NONE Performed at [facility], [address], [place], [place] [address]    Gram Stain   Final    FEW WBC PRESENT, PREDOMINANTLY PMN RED BLOOD CELLS PRESENT NO ORGANISMS SEEN    Culture   Final    FEW METHICILLIN RESISTANT STAPHYLOCOCCUS AUREUS CRITICAL RESULT CALLED TO, READ BACK BY AND VERIFIED WITH: D. DARK RN, AT 1054 12/06/22 D. Leighton Roach REGARDING CULTURE GROWTH Performed at [facility], [address], [place], [place] [address]    Report Status 12/06/2022 FINAL  Final   Organism ID, Bacteria METHICILLIN RESISTANT STAPHYLOCOCCUS AUREUS  Final      Susceptibility   Methicillin resistant staphylococcus aureus - MIC*    CIPROFLOXACIN >=8 RESISTANT Resistant     ERYTHROMYCIN >=8 RESISTANT Resistant     GENTAMICIN <=0.5 SENSITIVE Sensitive     OXACILLIN >=4 RESISTANT Resistant     TETRACYCLINE >=16  RESISTANT Resistant     VANCOMYCIN <=0.5 SENSITIVE Sensitive     TRIMETH/SULFA >=320 RESISTANT Resistant     CLINDAMYCIN <=0.25 SENSITIVE Sensitive     RIFAMPIN <=0.5 SENSITIVE Sensitive     Inducible Clindamycin NEGATIVE Sensitive     LINEZOLID 2 SENSITIVE Sensitive     * FEW METHICILLIN RESISTANT STAPHYLOCOCCUS AUREUS  Culture, blood (Routine X 2) w Reflex to ID Panel     Status: None (Preliminary result)   Collection Time: 12/06/22 11:31 AM   Specimen: BLOOD RIGHT HAND  Result Value Ref Range Status   Specimen Description   Final    BLOOD RIGHT HAND Performed  at [facility], [address], [place], [place] [address]    Special Requests   Final    AEROBIC BOTTLE ONLY Blood Culture adequate volume Performed at [facility], [address] [place], [place] [address]    Culture   Final    NO GROWTH <12 HOURS Performed at [facility], [address], [place], [place] [address]    Report Status PENDING  Incomplete  Culture, blood (Routine X 2) w Reflex to ID Panel     Status: None (Preliminary result)   Collection Time: 12/06/22 11:31 AM   Specimen: Left Antecubital; Blood  Result Value Ref Range Status   Specimen Description   Final    LEFT ANTECUBITAL BLOOD Performed at [facility], [address] [place], [place] [address]    Special Requests   Final    AEROBIC BOTTLE ONLY Blood Culture adequate volume Performed at [facility], [address] [place], [place] [address]    Culture   Final    NO GROWTH <12 HOURS Performed at [facility], [address] [place], [place] [address]    Report Status PENDING  Incomplete  Body fluid culture w Gram Stain     Status: None (Preliminary result)   Collection Time: 12/06/22 12:16 PM   Specimen: PATH Cytology Peritoneal fluid  Result Value Ref Range Status   Specimen Description   Final    PERITONEAL Performed at [facility], [address], [place], [place] [address]    Special Requests   Final    NONE Performed at [facility], [address], [place], [place] [address]    Gram Stain   Final    RARE WBC PRESENT, PREDOMINANTLY MONONUCLEAR RARE GRAM POSITIVE COCCI IN PAIRS CRITICAL RESULT CALLED TO, READ BACK BY AND VERIFIED WITH: RN DDot Been [phone] @ [phone] FH Performed at [facility], [address] [place], [place] [address]    Culture PENDING  Incomplete   Report Status PENDING  Incomplete     Serology:   Imaging: If present, new imagings (plain films, ct scans, and mri) have been personally visualized and interpreted; radiology reports have been reviewed. Decision making incorporated into the Impression / Recommendations.   12/06/22 tte  1. Left ventricular ejection fraction, by estimation, is 55 to 60%. The left ventricle has normal function. The left ventricle has no regional wall motion abnormalities. Left ventricular diastolic parameters were normal.   2. Right ventricular systolic function is normal. The right ventricular size is normal.   3. The mitral valve is normal in structure. No evidence of mitral valve Regurgitation. No evidence of mitral stenosis.   4. The aortic valve is normal in structure. Aortic valve regurgitation is not visualized. No aortic stenosis is present.   5. The inferior vena cava is normal in size with greater than 50% respiratory variability, suggesting right atrial pressure of 3 mmHg.     12/03/22 abd pelv ct 1. Recurrent large volume ascites throughout the abdomen measuring slightly greater than simple fluid. Findings are nonspecific and maybe related to patient's history of endometriosis. 2. Otherwise, no acute abdominopelvic findings.      Raymondo Band, MD [facility] [phone] pager    12/07/2022, 10:42 AM

## 2022-12-08 ENCOUNTER — Other Ambulatory Visit (HOSPITAL_COMMUNITY): Payer: Self-pay

## 2022-12-08 LAB — RENAL FUNCTION PANEL
Albumin: 2.2 g/dL — ABNORMAL LOW (ref 3.5–5.0)
Anion gap: 9 (ref 5–15)
BUN: 38 mg/dL — ABNORMAL HIGH (ref 6–20)
CO2: 15 mmol/L — ABNORMAL LOW (ref 22–32)
Calcium: 7.9 mg/dL — ABNORMAL LOW (ref 8.9–10.3)
Chloride: 106 mmol/L (ref 98–111)
Creatinine, Ser: 1.47 mg/dL — ABNORMAL HIGH (ref 0.44–1.00)
GFR, Estimated: 48 mL/min — ABNORMAL LOW (ref >60)
Glucose, Bld: 84 mg/dL (ref 70–99)
Phosphorus: 3.1 mg/dL (ref 2.5–4.6)
Potassium: 3.6 mmol/L (ref 3.5–5.1)
Sodium: 130 mmol/L — ABNORMAL LOW (ref 135–145)

## 2022-12-08 LAB — MISC LABCORP TEST (SEND OUT)
LabCorp test name: 1
Labcorp test code: 96388

## 2022-12-08 LAB — CULTURE, BLOOD (ROUTINE X 2): Culture: NO GROWTH

## 2022-12-08 LAB — BODY FLUID CULTURE W GRAM STAIN

## 2022-12-08 LAB — CK: Total CK: 26 U/L — ABNORMAL LOW (ref 38–234)

## 2022-12-08 MED ORDER — STERILE WATER FOR INJECTION IV SOLN
INTRAVENOUS | Status: DC
  Filled 2022-12-08 (×3): qty 150

## 2022-12-08 NOTE — Telephone Encounter (Signed)
Patient Advocate Encounter  Prior Authorization for Linezolid 600MG  tablets  has been approved for 28 tablets at a time.    PA# [id number] [employer] Electronic PA Form Effective dates: 12/08/2022 through 01/07/2023  Patients co-pay is $15.00.     Roland Earl, CPhT Pharmacy Patient Advocate Specialist [facility] Patient Advocate Team Direct Number: [phone]  Fax: [phone]

## 2022-12-08 NOTE — Progress Notes (Addendum)
[facility]  PROGRESS NOTE  Angelica Blankenship ZOX:[id number] DOB: 1990/11/04 DOA: 12/04/2022 PCP: Marcine Matar, MD   Brief HPI:   32 year old female with history of esophagus, endometriosis, adnexal mass, recurrent ascites with last paracentesis in February this year, asymptomatic heart murmur, hypertension, pulm nodule, normocytic anemia came to hospital for abdominal pain.  4 L of fluid was removed in the ER via paracentesis.  Culture grew MRSA, ID was consulted.    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 and also has multiple/complaints, rings placed several months prior which could be site of injury, bacterial translocation to blood -CT abdomen/pelvis unremarkable, no abscess at umbilicus -Ascitic fluid showed few MRSA sensitive to penicillin, 97% neutrophils; blood culture pending -Antibiotics changed to IV daptomycin -ID plan to treat for 4 weeks antibiotics due to complicated MRSA infection, follow blood culture results if blood culture negative patient does not need TEE -TTE is unremarkable  Acute kidney injury -Creatinine 1.91; currently improving with IV fluids -Today creatinine is 1.47 -Likely from intravascular volume depletion; after paracentesis, emesis -UA shows proteinuria, hematuria -IV fluids changed to isotonic bicarb as per nephrology -Discussed with nephrology, likely multifactorial from intravascular volume depletion from paracentesis, emesis, Toradol, vancomycin   History of endometriosis No need for GYN at the moment per GYN on-call. (Dr. Duane Lope)- will reach out to doc at [facility] -restarted hormonal treatment   Essential hypertension -Currently on metoprolol 25 mg daily, started in the ED   Normocytic anemia Monitor hematocrit and hemoglobin. Transfuse as needed.    Hypokalemia -Replete   Hypomagnesemia/hypophosphatemia  -Replete  Itching -Continue Benadryl as needed    Medications     docusate sodium  100 mg Oral BID   metoprolol succinate  25 mg Oral Daily   norethindrone  5 mg Oral BID     Data Reviewed:   CBG:  No results for input(s): "GLUCAP" in the last 168 hours.  SpO2: 94 % O2 Flow Rate (L/min): 1 L/min    Vitals:   12/07/22 0820 12/07/22 1315 12/07/22 2019 12/08/22 0421  BP: 137/88 (!) 135/95 (!) 136/94 133/88  Pulse: (!) 125 (!) 113 (!) 118 (!) 126  Resp: (!) 21 (!) 22 (!) 25 (!) 24  Temp: 98.4 F (36.9 C) 98.4 F (36.9 C) 99.1 F (37.3 C) 99 F (37.2 C)  TempSrc: Oral Oral Oral Oral  SpO2: 98% 97% 95% 94%  Weight:      Height:          Data Reviewed:  Basic Metabolic Panel: Recent Labs  Lab 12/04/22 0740 12/04/22 1817 12/05/22 0402 12/05/22 0405 12/06/22 0841 12/07/22 0358 12/08/22 0438  NA 135  --   --  134* 137 135 130*  K 3.3*  --   --  4.2 3.9 4.6 3.6  CL 107  --   --  108 111 108 106  CO2 17*  --   --  16* 17* 14* 15*  GLUCOSE 127*  --   --  115* 74 67* 84  BUN 15  --   --  19 34* 44* 38*  CREATININE 0.89  --   --  1.15* 1.64* 1.91* 1.47*  CALCIUM 8.6*  --   --  8.0* 8.0* 8.1* 7.9*  MG  --  1.6* 2.3  --   --   --   --   PHOS  --  1.0* 3.5  --   --   --  3.1    CBC: Recent Labs  Lab 12/03/22 1233 12/03/22 1242 12/04/22 0740 12/05/22 0405 12/05/22 0807 12/06/22 0841 12/07/22 0358  WBC 9.2  --  4.8 QUESTIONABLE RESULTS, RECOMMEND RECOLLECT TO VERIFY 1.8* 3.1* 4.3  NEUTROABS 8.6*  --  4.3  --   --   --   --   HGB 12.2   < > 11.5* 9.1* 9.1* 10.6* 8.3*  HCT 36.4   < > 34.3* 27.0* 26.8* 31.9* 25.8*  MCV 95.0  --  94.2 93.8 93.1 94.1 95.2  PLT 326  --  314 247 254 258 282   < > = values in this interval not displayed.    LFT Recent Labs  Lab 12/03/22 1233 12/04/22 0740 12/05/22 0405 12/08/22 0438  AST 16 14* 18  --   ALT 18 14 13   --   ALKPHOS 39 31* 17*  --   BILITOT  0.6 0.5 1.2  --   PROT 8.0 7.9 6.6  --   ALBUMIN 3.7 3.5 4.0 2.2*     Antibiotics: Anti-infectives (From admission, onward)    Start     Dose/Rate Route Frequency Ordered Stop   12/07/22 1400  DAPTOmycin (CUBICIN) 500 mg in sodium chloride 0.9 % IVPB        500 mg 120 mL/hr over 30 Minutes Intravenous Daily 12/07/22 0804     12/07/22 0000  vancomycin (VANCOCIN) IVPB 1000 mg/200 mL premix  Status:  Discontinued        1,000 mg 200 mL/hr over 60 Minutes Intravenous Every 24 hours 12/06/22 1002 12/07/22 0804   12/06/22 0300  vancomycin (VANCOREADY) IVPB 750 mg/150 mL  Status:  Discontinued        750 mg 150 mL/hr over 60 Minutes Intravenous Every 12 hours 12/05/22 1354 12/06/22 1001   12/05/22 1445  vancomycin (VANCOREADY) IVPB 1250 mg/250 mL        1,250 mg 166.7 mL/hr over 90 Minutes Intravenous  Once 12/05/22 1354 12/05/22 1743   12/04/22 1245  cefTRIAXone (ROCEPHIN) 2 g in sodium chloride 0.9 % 100 mL IVPB  Status:  Discontinued        2 g 200 mL/hr over 30 Minutes Intravenous Every 24 hours 12/04/22 1231 12/05/22 1348        DVT prophylaxis: SCDs  Code Status: Full code  Family Communication: Discussed with patient's mother at bedside   CONSULTS ID   Subjective    Denies any pain  Objective    Physical Examination:   General-appears in no acute distress Heart-S1-S2, regular, no murmur auscultated Lungs-clear to auscultation bilaterally, no wheezing or crackles auscultated Abdomen-soft, nontender, no organomegaly, abdominal distention Extremities-no edema in the lower extremities Neuro-alert, oriented x3, no focal deficit noted   Status is: Inpatient:             Meredeth Ide   [facility] If 7PM-7AM, please contact night-coverage at [email or web address], Office  [phone]   12/08/2022, 8:16 AM  LOS: 4 days

## 2022-12-08 NOTE — Progress Notes (Signed)
Patient ID: Angelica Blankenship, female   DOB: 07-02-1991, 32 y.o.   MRN: [id number] S: Feeling better this morning.  IVF's stopped last night. O:BP 133/88 (BP Location: Left Arm)   Pulse (!) 126   Temp 99 F (37.2 C) (Oral)   Resp (!) 24   Ht 5\' 6"  (1.676 m)   Wt 67.2 kg   LMP  (LMP Unknown)   SpO2 94%   BMI 23.91 kg/m   Intake/Output Summary (Last 24 hours) at 12/08/2022 0813 Last data filed at 12/07/2022 2223 Gross per 24 hour  Intake 1170.25 ml  Output 750 ml  Net 420.25 ml   Intake/Output: I/O last 3 completed shifts: In: 2029.5 [P.O.:960; I.V.:809.5; IV Piggyback:260] Out: 750 [Urine:750]  Intake/Output this shift:  No intake/output data recorded. Weight change:  Gen: NAD CVS: tachy Resp:CTA Abd: +BS, distended, mildly tender to palpation Ext: no edema  Recent Labs  Lab 12/03/22 1233 12/03/22 1242 12/04/22 0740 12/04/22 1817 12/05/22 0402 12/05/22 0405 12/06/22 0841 12/07/22 0358 12/08/22 0438  NA 134* 139 135  --   --  134* 137 135 130*  K 3.3* 3.3* 3.3*  --   --  4.2 3.9 4.6 3.6  CL 108 108 107  --   --  108 111 108 106  CO2 16*  --  17*  --   --  16* 17* 14* 15*  GLUCOSE 114* 119* 127*  --   --  115* 74 67* 84  BUN 9 7 15   --   --  19 34* 44* 38*  CREATININE 0.75 0.80 0.89  --   --  1.15* 1.64* 1.91* 1.47*  ALBUMIN 3.7  --  3.5  --   --  4.0  --   --  2.2*  CALCIUM 8.7*  --  8.6*  --   --  8.0* 8.0* 8.1* 7.9*  PHOS  --   --   --  1.0* 3.5  --   --   --  3.1  AST 16  --  14*  --   --  18  --   --   --   ALT 18  --  14  --   --  13  --   --   --    Liver Function Tests: Recent Labs  Lab 12/03/22 1233 12/04/22 0740 12/05/22 0405 12/08/22 0438  AST 16 14* 18  --   ALT 18 14 13   --   ALKPHOS 39 31* 17*  --   BILITOT 0.6 0.5 1.2  --   PROT 8.0 7.9 6.6  --   ALBUMIN 3.7 3.5 4.0 2.2*   Recent Labs  Lab 12/03/22 1233 12/04/22 0740  LIPASE 27 23   No results for input(s): "AMMONIA" in the last 168 hours. CBC: Recent Labs  Lab 12/03/22 1233  12/03/22 1242 12/04/22 0740 12/05/22 0405 12/05/22 0807 12/06/22 0841 12/07/22 0358  WBC 9.2  --  4.8 QUESTIONABLE RESULTS, RECOMMEND RECOLLECT TO VERIFY 1.8* 3.1* 4.3  NEUTROABS 8.6*  --  4.3  --   --   --   --   HGB 12.2   < > 11.5* 9.1* 9.1* 10.6* 8.3*  HCT 36.4   < > 34.3* 27.0* 26.8* 31.9* 25.8*  MCV 95.0  --  94.2 93.8 93.1 94.1 95.2  PLT 326  --  314 247 254 258 282   < > = values in this interval not displayed.   Cardiac Enzymes: Recent Labs  Lab 12/08/22  0438  CKTOTAL 26*   CBG: No results for input(s): "GLUCAP" in the last 168 hours.  Iron Studies: No results for input(s): "IRON", "TIBC", "TRANSFERRIN", "FERRITIN" in the last 72 hours. Studies/Results: ECHOCARDIOGRAM COMPLETE  Result Date: 12/06/2022    ECHOCARDIOGRAM REPORT   Patient Name:   Angelica Blankenship Date of Exam: 12/06/2022 Medical Rec #:  [id number]        Height:       66.0 in Accession #:    [id number]       Weight:       148.1 lb Date of Birth:  1991-04-15         BSA:          1.760 m Patient Age:    32 years         BP:           116/84 mmHg Patient Gender: F                HR:           114 bpm. Exam Location:  Inpatient Procedure: 2D Echo, Color Doppler and Cardiac Doppler Indications:    Bacteremia  History:        Patient has prior history of Echocardiogram examinations, most                 recent 02/09/2020. Arrythmias:Tachycardia; Risk                 Factors:Hypertension.  Sonographer:    Lucy Antigua Referring Phys: [id number] JESSICA U VANN IMPRESSIONS  1. Left ventricular ejection fraction, by estimation, is 55 to 60%. The left ventricle has normal function. The left ventricle has no regional wall motion abnormalities. Left ventricular diastolic parameters were normal.  2. Right ventricular systolic function is normal. The right ventricular size is normal.  3. The mitral valve is normal in structure. No evidence of mitral valve regurgitation. No evidence of mitral stenosis.  4. The aortic valve is normal in  structure. Aortic valve regurgitation is not visualized. No aortic stenosis is present.  5. The inferior vena cava is normal in size with greater than 50% respiratory variability, suggesting right atrial pressure of 3 mmHg. FINDINGS  Left Ventricle: Left ventricular ejection fraction, by estimation, is 55 to 60%. The left ventricle has normal function. The left ventricle has no regional wall motion abnormalities. The left ventricular internal cavity size was normal in size. There is  no left ventricular hypertrophy. Left ventricular diastolic parameters were normal. Right Ventricle: The right ventricular size is normal. No increase in right ventricular wall thickness. Right ventricular systolic function is normal. Left [facility]: Left atrial size was normal in size. Right [facility]: Right atrial size was normal in size. Pericardium: There is no evidence of pericardial effusion. Mitral Valve: The mitral valve is normal in structure. There is mild thickening of the anterior mitral valve leaflet(s). No evidence of mitral valve regurgitation. No evidence of mitral valve stenosis. Tricuspid Valve: The tricuspid valve is normal in structure. Tricuspid valve regurgitation is mild . No evidence of tricuspid stenosis. Aortic Valve: The aortic valve is normal in structure. Aortic valve regurgitation is not visualized. No aortic stenosis is present. Aortic valve mean gradient measures 6.0 mmHg. Aortic valve peak gradient measures 10.5 mmHg. Aortic valve area, by VTI measures 2.75 cm. Pulmonic Valve: The pulmonic valve was normal in structure. Pulmonic valve regurgitation is not visualized. No evidence of pulmonic stenosis. Aorta: The aortic root is normal in size  and structure. Venous: The inferior vena cava is normal in size with greater than 50% respiratory variability, suggesting right atrial pressure of 3 mmHg. IAS/Shunts: No atrial level shunt detected by color flow Doppler.  LEFT VENTRICLE PLAX 2D LVIDd:         4.60 cm    Diastology LVIDs:         3.15 cm   LV e' medial:    6.74 cm/s LV PW:         1.05 cm   LV E/e' medial:  16.9 LV IVS:        0.85 cm   LV e' lateral:   15.20 cm/s LVOT diam:     2.10 cm   LV E/e' lateral: 7.5 LV SV:         76 LV SV Index:   43 LVOT Area:     3.46 cm  RIGHT VENTRICLE             IVC RV S prime:     13.90 cm/s  IVC diam: 2.00 cm TAPSE (M-mode): 1.3 cm LEFT ATRIUM             Index        RIGHT ATRIUM           Index LA Vol (A2C):   30.9 ml 17.55 ml/m  RA Area:     16.10 cm LA Vol (A4C):   33.7 ml 19.14 ml/m  RA Volume:   42.70 ml  24.26 ml/m LA Biplane Vol: 34.1 ml 19.37 ml/m  AORTIC VALVE AV Area (Vmax):    2.99 cm AV Area (Vmean):   2.56 cm AV Area (VTI):     2.75 cm AV Vmax:           162.00 cm/s AV Vmean:          111.000 cm/s AV VTI:            0.277 m AV Peak Grad:      10.5 mmHg AV Mean Grad:      6.0 mmHg LVOT Vmax:         140.00 cm/s LVOT Vmean:        81.900 cm/s LVOT VTI:          0.220 m LVOT/AV VTI ratio: 0.79  AORTA Ao Root diam: 2.80 cm Ao Asc diam:  2.60 cm MITRAL VALVE                TRICUSPID VALVE MV Area (PHT): 5.27 cm     TR Peak grad:   21.7 mmHg MV Decel Time: 144 msec     TR Vmax:        233.00 cm/s MV E velocity: 114.00 cm/s MV A velocity: 63.70 cm/s   SHUNTS MV E/A ratio:  1.79         Systemic VTI:  0.22 m                             Systemic Diam: 2.10 cm Aditya Sabharwal Electronically signed by Dorthula Nettles Signature Date/Time: 12/06/2022/2:33:51 PM    Final    US Paracentesis  Result Date: 12/06/2022 INDICATION: Patient with history of endometriosis, abdominal pain, recurrent ascites, spontaneous bacterial peritonitis; request received for diagnostic and therapeutic paracentesis EXAM: ULTRASOUND GUIDED DIAGNOSTIC AND THERAPEUTIC PARACENTESIS MEDICATIONS: 8 mL 1% lidocaine COMPLICATIONS: None immediate. PROCEDURE: Informed written consent was obtained from the patient after a discussion of the risks,  benefits and alternatives to treatment. A timeout was  performed prior to the initiation of the procedure. Initial ultrasound scanning demonstrates a moderate amount of ascites within the right upper to mid abdominal quadrant. The right upper to mid abdomen was prepped and draped in the usual sterile fashion. 1% lidocaine was used for local anesthesia. Following this, a 19 gauge, 10-cm, Yueh catheter was introduced. An ultrasound image was saved for documentation purposes. The paracentesis was performed. The catheter was removed and a dressing was applied. The patient tolerated the procedure well without immediate post procedural complication. FINDINGS: A total of approximately 2.3 liters of dark brown fluid was removed. Samples were sent to the laboratory as requested by the clinical team. The ascitic fluid was multiseptated. IMPRESSION: Successful ultrasound-guided diagnostic and therapeutic paracentesis yielding 2.3 liters of peritoneal fluid. Performed by: Artemio Aly Electronically Signed   By: Acquanetta Belling M.D.   On: 12/06/2022 13:35    docusate sodium  100 mg Oral BID   metoprolol succinate  25 mg Oral Daily   norethindrone  5 mg Oral BID    BMET    Component Value Date/Time   NA 130 (L) 12/08/2022 0438   K 3.6 12/08/2022 0438   CL 106 12/08/2022 0438   CO2 15 (L) 12/08/2022 0438   GLUCOSE 84 12/08/2022 0438   BUN 38 (H) 12/08/2022 0438   CREATININE 1.47 (H) 12/08/2022 0438   CALCIUM 7.9 (L) 12/08/2022 0438   GFRNONAA 48 (L) 12/08/2022 0438   GFRAA >60 03/13/2020 1527   CBC    Component Value Date/Time   WBC 4.3 12/07/2022 0358   RBC 2.71 (L) 12/07/2022 0358   HGB 8.3 (L) 12/07/2022 0358   HCT 25.8 (L) 12/07/2022 0358   PLT 282 12/07/2022 0358   MCV 95.2 12/07/2022 0358   MCH 30.6 12/07/2022 0358   MCHC 32.2 12/07/2022 0358   RDW 16.4 (H) 12/07/2022 0358   LYMPHSABS 0.3 (L) 12/04/2022 0740   MONOABS 0.2 12/04/2022 0740   EOSABS 0.0 12/04/2022 0740   BASOSABS 0.0 12/04/2022 0740     Assessment/Plan:  AKI -  multifactorial with preceding volume depletion then complicated by IV contrast, 2 large volume paracenteses, IV NSAID, and IV vancomycin.  She reports that she is making urine but none documented.  Will place order for strict I's/O's.  BUN/Cr markedly improved with IVF's.  UNa was < 10 so will resume IVF's but will change to isotonic bicarb.  No indication for dialysis at this time.  Will continue to follow.  Hold off on any further paracenteses until her renal function starts to improve.  Avoid nephrotoxic medications including NSAIDs and iodinated intravenous contrast exposure unless the latter is absolutely indicated.   Preferred narcotic agents for pain control are hydromorphone, fentanyl, and methadone. Morphine should not be used.  Avoid Baclofen and avoid oral sodium phosphate and magnesium citrate based laxatives / bowel preps.  Continue strict Input and Output monitoring.  Will monitor the patient closely with you and intervene or adjust therapy as indicated by changes in clinical status/labs   Metabolic acidosis - due to #1.  was on lactated ringers but stopped last night.  Will resume IVF's but use isotonic bicarb and follow.   MRSA SBP - ID following.  TTE negative.  Vanco switched to Daptomycin. Recurrent ascites due to stage IV endometriosis - would limit paracenteses as this only exacerbates her AKI. HTN - stable. Normocytic anemia - transfuse prn  Irena Cords, MD [facility]

## 2022-12-09 ENCOUNTER — Other Ambulatory Visit: Payer: Self-pay

## 2022-12-09 ENCOUNTER — Inpatient Hospital Stay (HOSPITAL_COMMUNITY): Payer: BC Managed Care – PPO

## 2022-12-09 DIAGNOSIS — A4102 Sepsis due to Methicillin resistant Staphylococcus aureus: Secondary | ICD-10-CM

## 2022-12-09 DIAGNOSIS — N179 Acute kidney failure, unspecified: Secondary | ICD-10-CM

## 2022-12-09 DIAGNOSIS — R6520 Severe sepsis without septic shock: Secondary | ICD-10-CM | POA: Diagnosis not present

## 2022-12-09 DIAGNOSIS — K652 Spontaneous bacterial peritonitis: Secondary | ICD-10-CM | POA: Diagnosis not present

## 2022-12-09 LAB — RENAL FUNCTION PANEL
Albumin: 2.3 g/dL — ABNORMAL LOW (ref 3.5–5.0)
Anion gap: 12 (ref 5–15)
BUN: 32 mg/dL — ABNORMAL HIGH (ref 6–20)
CO2: 22 mmol/L (ref 22–32)
Calcium: 8.1 mg/dL — ABNORMAL LOW (ref 8.9–10.3)
Chloride: 99 mmol/L (ref 98–111)
Creatinine, Ser: 1.39 mg/dL — ABNORMAL HIGH (ref 0.44–1.00)
GFR, Estimated: 51 mL/min — ABNORMAL LOW (ref >60)
Glucose, Bld: 101 mg/dL — ABNORMAL HIGH (ref 70–99)
Phosphorus: 2.9 mg/dL (ref 2.5–4.6)
Potassium: 3.2 mmol/L — ABNORMAL LOW (ref 3.5–5.1)
Sodium: 133 mmol/L — ABNORMAL LOW (ref 135–145)

## 2022-12-09 MED ORDER — OXYCODONE HCL 5 MG PO TABS
10.0000 mg | ORAL_TABLET | ORAL | Status: DC | PRN
  Administered 2022-12-09 (×2): 10 mg via ORAL
  Filled 2022-12-09 (×2): qty 2

## 2022-12-09 MED ORDER — POTASSIUM CHLORIDE 20 MEQ PO PACK
40.0000 meq | PACK | Freq: Once | ORAL | Status: AC
  Administered 2022-12-09: 40 meq via ORAL
  Filled 2022-12-09: qty 2

## 2022-12-09 MED ORDER — OXYCODONE HCL 5 MG PO TABS
10.0000 mg | ORAL_TABLET | ORAL | Status: DC | PRN
  Administered 2022-12-09 – 2022-12-10 (×8): 10 mg via ORAL
  Filled 2022-12-09 (×8): qty 2

## 2022-12-09 MED ORDER — BISACODYL 10 MG RE SUPP
10.0000 mg | Freq: Once | RECTAL | Status: AC
  Administered 2022-12-09: 10 mg via RECTAL
  Filled 2022-12-09: qty 1

## 2022-12-09 MED ORDER — SIMETHICONE 80 MG PO CHEW
80.0000 mg | CHEWABLE_TABLET | Freq: Four times a day (QID) | ORAL | Status: DC | PRN
  Administered 2022-12-09 – 2022-12-12 (×5): 80 mg via ORAL
  Filled 2022-12-09 (×5): qty 1

## 2022-12-09 MED ORDER — ALBUMIN HUMAN 25 % IV SOLN
25.0000 g | Freq: Once | INTRAVENOUS | Status: AC
  Administered 2022-12-09: 25 g via INTRAVENOUS
  Filled 2022-12-09: qty 100

## 2022-12-09 MED ORDER — HYDROMORPHONE HCL 1 MG/ML IJ SOLN
2.0000 mg | INTRAMUSCULAR | Status: AC
  Administered 2022-12-09: 2 mg via INTRAVENOUS
  Filled 2022-12-09: qty 2

## 2022-12-09 MED ORDER — HYDROMORPHONE HCL 1 MG/ML IJ SOLN: 2.0000 mg | Freq: Four times a day (QID) | INTRAMUSCULAR | Status: DC | PRN

## 2022-12-09 MED ORDER — HYDROMORPHONE HCL 1 MG/ML IJ SOLN: 2.0000 mg | INTRAMUSCULAR | Status: DC | PRN

## 2022-12-09 MED ORDER — HYDROMORPHONE HCL 1 MG/ML IJ SOLN
2.0000 mg | Freq: Four times a day (QID) | INTRAMUSCULAR | Status: DC | PRN
  Administered 2022-12-10 (×2): 2 mg via INTRAVENOUS
  Filled 2022-12-09 (×2): qty 2

## 2022-12-09 NOTE — Progress Notes (Addendum)
[facility]  PROGRESS NOTE  Angelica Blankenship ZOX:[id number] DOB: [birth year] DOA: 12/04/2022 PCP: Marcine Matar, MD   Brief HPI:   32 year old female with history of esophagus, endometriosis, adnexal mass, recurrent ascites with last paracentesis in February this year, asymptomatic heart murmur, hypertension, pulm nodule, normocytic anemia came to hospital for abdominal pain.  4 L of fluid was removed in the ER via paracentesis.  Culture grew MRSA, ID was consulted.    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 -Will get PT evaluation  Abdominal pain -Abdominal exam is benign -Secondary to above -CT abdomen/pelvis obtained on 12/03/2022 was unremarkable -Patient continues to get Dilaudid 2 mg IV every 3 to 4 hours as needed  Acute kidney injury -Creatinine 1.91; currently improving with IV fluids -Today creatinine is 1.39 -Likely from intravascular volume depletion; after paracentesis, emesis -UA shows proteinuria, hematuria -IV fluids changed to isotonic bicarb as per nephrology -Discussed with nephrology, likely multifactorial from intravascular volume depletion from paracentesis, emesis, Toradol, vancomycin   History of endometriosis No need  for GYN at the moment per GYN on-call. (Dr. Duane Lope)- will reach out to doc at [facility] -restarted hormonal treatment   Essential hypertension -Currently on metoprolol 25 mg daily, started in the ED  Sinus tachycardia -Patient having sinus tachycardia during this hospital admission -Secondary to fluid shift after paracentesis and intravascular depletion -Has been getting IV fluids in the hospital, she is +8 L volume overload -Currently on isotonic bicarb infusion at 100 mL/h -She is also on metoprolol for hypertension as above -Patient's heart rate is a physiologic response to her severe dehydration as well as SBP along with abdominal pain -Hopefully heart rate should improve with antibiotics and IV fluids -Continue to monitor on telemetry   Normocytic anemia -Hemoglobin dropped to 8.9 as of 12/07/2022 -Likely dilutional effect, no evidence of black stool or bleeding -Check CBC in a.m. Transfuse as needed.   Hypokalemia -Potassium is 3.2 -Replace potassium and follow BMP in am   Hypomagnesemia/hypophosphatemia  -Replete  Itching -Continue Benadryl as needed    Medications     docusate sodium  100 mg Oral BID   metoprolol succinate  25 mg Oral Daily   norethindrone  5 mg Oral BID   potassium chloride  40 mEq Oral Once     Data Reviewed:   CBG:  No results for input(s): "GLUCAP" in the last 168 hours.  SpO2: 97 % O2 Flow Rate (L/min): 1 L/min    Vitals:   12/08/22 1227 12/08/22 1900 12/09/22 0005 12/09/22 0500  BP: 132/83 131/78 131/78 125/80  Pulse: (!) 130 (!) 135 (!) 132 (!) 121  Resp: 20 20 17 18   Temp: 98.2 F (36.8 C) 98.7 F (37.1 C) 98.7 F (37.1 C) 98.3 F (36.8 C)  TempSrc: Oral Oral Oral Oral  SpO2: 96% 96% 96% 97%  Weight:  Height:          Data Reviewed:  Basic Metabolic Panel: Recent Labs  Lab 12/04/22 1817 12/05/22 0402 12/05/22 0405 12/06/22 0841 12/07/22 0358 12/08/22 0438 12/09/22 0424  NA  --   --  134* 137 135  130* 133*  K  --   --  4.2 3.9 4.6 3.6 3.2*  CL  --   --  108 111 108 106 99  CO2  --   --  16* 17* 14* 15* 22  GLUCOSE  --   --  115* 74 67* 84 101*  BUN  --   --  19 34* 44* 38* 32*  CREATININE  --   --  1.15* 1.64* 1.91* 1.47* 1.39*  CALCIUM  --   --  8.0* 8.0* 8.1* 7.9* 8.1*  MG 1.6* 2.3  --   --   --   --   --   PHOS 1.0* 3.5  --   --   --  3.1 2.9    CBC: Recent Labs  Lab 12/03/22 1233 12/03/22 1242 12/04/22 0740 12/05/22 0405 12/05/22 0807 12/06/22 0841 12/07/22 0358  WBC 9.2  --  4.8 QUESTIONABLE RESULTS, RECOMMEND RECOLLECT TO VERIFY 1.8* 3.1* 4.3  NEUTROABS 8.6*  --  4.3  --   --   --   --   HGB 12.2   < > 11.5* 9.1* 9.1* 10.6* 8.3*  HCT 36.4   < > 34.3* 27.0* 26.8* 31.9* 25.8*  MCV 95.0  --  94.2 93.8 93.1 94.1 95.2  PLT 326  --  314 247 254 258 282   < > = values in this interval not displayed.    LFT Recent Labs  Lab 12/03/22 1233 12/04/22 0740 12/05/22 0405 12/08/22 0438 12/09/22 0424  AST 16 14* 18  --   --   ALT 18 14 13   --   --   ALKPHOS 39 31* 17*  --   --   BILITOT 0.6 0.5 1.2  --   --   PROT 8.0 7.9 6.6  --   --   ALBUMIN 3.7 3.5 4.0 2.2* 2.3*     Antibiotics: Anti-infectives (From admission, onward)    Start     Dose/Rate Route Frequency Ordered Stop   12/07/22 1400  DAPTOmycin (CUBICIN) 500 mg in sodium chloride 0.9 % IVPB        500 mg 120 mL/hr over 30 Minutes Intravenous Daily 12/07/22 0804     12/07/22 0000  vancomycin (VANCOCIN) IVPB 1000 mg/200 mL premix  Status:  Discontinued        1,000 mg 200 mL/hr over 60 Minutes Intravenous Every 24 hours 12/06/22 1002 12/07/22 0804   12/06/22 0300  vancomycin (VANCOREADY) IVPB 750 mg/150 mL  Status:  Discontinued        750 mg 150 mL/hr over 60 Minutes Intravenous Every 12 hours 12/05/22 1354 12/06/22 1001   12/05/22 1445  vancomycin (VANCOREADY) IVPB 1250 mg/250 mL        1,250 mg 166.7 mL/hr over 90 Minutes Intravenous  Once 12/05/22 1354 12/05/22 1743   12/04/22 1245  cefTRIAXone  (ROCEPHIN) 2 g in sodium chloride 0.9 % 100 mL IVPB  Status:  Discontinued        2 g 200 mL/hr over 30 Minutes Intravenous Every 24 hours 12/04/22 1231 12/05/22 1348        DVT prophylaxis: SCDs  Code Status: Full code  Family Communication: Discussed with patient's mother on phone, she  refuses patient to be discharged home with IV antibiotics.  She wants patient to stay in the hospital for IV antibiotics.   CONSULTS ID   Subjective   Patient continues to complain of abdominal pain.   Objective    Physical Examination:   General-appears in no acute distress Heart-S1-S2, regular, no murmur auscultated Lungs-clear to auscultation bilaterally, no wheezing or crackles auscultated Abdomen-soft, mild tenderness to palpation,  no organomegaly Extremities-no edema in the lower extremities Neuro-alert, oriented x3, no focal deficit noted   Status is: Inpatient:             Meredeth Ide   [facility] If 7PM-7AM, please contact night-coverage at [email or web address], Office  [phone]   12/09/2022, 8:51 AM  LOS: 5 days

## 2022-12-09 NOTE — Progress Notes (Signed)
Spoke with patient about need for PICC placement; explained the cause for it and what it is used for; patient declined at this time; explained to patient about the effects of IV dilaudid in relation to her QTC readings; recommended patient take oxycodone; pt agreed to take oxycodone instead; spoke to patient about home health as it relates to administering medications through a PICC line; also spoke to them about refusing to leave the hospital; told them that case management would inform them of the policies about refusing discharge; I explained to them that if patient was not safe or actively in intense pain, then the goal would be to get it managed before she would leave; explained that the doctors are trying to prepare her for discharge not that she would necessarily be discharging today; patient and family stated that they understood things better and would make their decisions based off what we had discussed; I also encouraged charge nurse to speak with them as well

## 2022-12-09 NOTE — Progress Notes (Signed)
[facility]  Date of Admission:  12/04/2022     Lines:  Peripheral iv's   Abx: 6/5-c daptomycin  6/3-5 vanc   6/02-3 ceftriaxone                                                            Assessment: 32 yo female with adnexal mass, endometriosis on hormone therapy, recurrent ascites, htn, pulm nodule, admitted 12/04/22  for acute on chronic abd pain found to have sepsis and mrsa bacteriascites. 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 Vanc switched to dapto 6/5 for safety   Again previously relate intermittent thoracolumbar back pain with endometriosis/menstruation time and no difference this admission. Will monitor on treatment. Acuity (<2 days of sx) no plan to do mri at this time        Plan: Plan 4 weeks abx; will give 10 more days daptomycin including today, and then finish 2 more weeks with linezolid 600 mg po bid She has follow up with id clinic 6/27 @ 2pm Midline can be placed today Discussed with primary team   OPAT Orders Discharge antibiotics to be given via PICC line Discharge antibiotics: Daptomycin for 10 days, then zyvox 600 mg po bid for 2 more weeks  Duration: 10 days End Date: 6/17  [facility] Care Per Protocol:  Home health RN for IV administration and teaching;  PICC line care and labs.    Labs weekly while on IV antibiotics: _x_ CBC with differential __ BMP _x_ CMP _x_ CRP __ ESR __ Vancomycin trough _x_ CK  _x_ Please pull PIC at completion of IV antibiotics __ Please leave PIC in place until doctor has seen patient or been notified  Fax weekly labs to [phone]  Clinic Follow Up Appt: As above  @  [facility] [address], [place], [place] [address] Phone: [phone]       Principal Problem:   Spontaneous bacterial peritonitis [facility]) Active Problems:   Endometriosis   Essential hypertension   Normocytic anemia   Hypokalemia   Hypomagnesemia   No Known Allergies  Scheduled Meds:  docusate sodium  100 mg Oral BID   metoprolol succinate  25 mg Oral Daily   norethindrone  5 mg Oral BID   Continuous Infusions:  DAPTOmycin (CUBICIN) 500 mg in sodium chloride 0.9 % IVPB Stopped (12/08/22 1601)   sodium bicarbonate 150 mEq in sterile water 1,150 mL infusion Stopped (12/09/22 0714)   PRN Meds:.acetaminophen **OR** acetaminophen, hydrocortisone cream, ondansetron **OR** ondansetron (ZOFRAN) IV, oxyCODONE, polyethylene glycol, simethicone   SUBJECTIVE: Abd pain much better No n/v/diarrhea No  f/c    Review of Systems: ROS All other ROS was negative, except mentioned above     OBJECTIVE: Vitals:   12/08/22 1227 12/08/22 1900 12/09/22 0005 12/09/22 0500  BP: 132/83 131/78 131/78 125/80  Pulse: (!) 130 (!) 135 (!) 132 (!) 121  Resp: 20 20 17 18   Temp: 98.2 F (36.8 C) 98.7 F (37.1 C) 98.7 F (37.1 C) 98.3 F (36.8 C)  TempSrc: Oral Oral Oral Oral  SpO2: 96% 96% 96% 97%  Weight:      Height:       Body mass index is 23.91 kg/m.  Physical Exam  General/constitutional: no distress, pleasant HEENT: Normocephalic, PER, Conj Clear, EOMI, Oropharynx clear Neck supple CV: rrr no mrg Lungs: clear to auscultation, normal respiratory effort Abd: Soft, distended, mildly tender Ext: no  edema Skin: No Rash Neuro: nonfocal MSK: no peripheral joint swelling/tenderness/warmth; back spines nontender      Lab Results Lab Results  Component Value Date   WBC 4.3 12/07/2022   HGB 8.3 (L) 12/07/2022   HCT 25.8 (L) 12/07/2022   MCV 95.2 12/07/2022   PLT 282 12/07/2022    Lab Results  Component Value Date   CREATININE 1.39 (H) 12/09/2022   BUN 32 (H) 12/09/2022   NA 133 (L) 12/09/2022   K 3.2 (L) 12/09/2022   CL 99 12/09/2022   CO2 22 12/09/2022    Lab Results  Component Value Date   ALT 13 12/05/2022   AST 18 12/05/2022   ALKPHOS 17 (L) 12/05/2022   BILITOT 1.2 12/05/2022      Microbiology: Recent Results (from the past 240 hour(s))  Body fluid culture w Gram Stain     Status: None (Preliminary result)   Collection Time: 12/04/22 10:09 AM   Specimen: Peritoneal Washings; Body Fluid  Result Value Ref Range Status   Specimen Description   Final    PERITONEAL Performed at [facility], [address], [place], [place] [address]    Special Requests   Final    NONE Performed at [facility], [address] [place], [place] [address]    Gram Stain   Final    FEW WBC PRESENT, PREDOMINANTLY PMN RED BLOOD CELLS PRESENT NO ORGANISMS SEEN    Culture   Final    FEW METHICILLIN RESISTANT STAPHYLOCOCCUS AUREUS CRITICAL RESULT CALLED TO, READ BACK BY AND VERIFIED WITH: D. DARK RN, AT 1054 12/06/22 D. VANHOOK REGARDING CULTURE GROWTH Sent to [facility] for further susceptibility testing. Performed at [facility], [address], [place], [place] [address]    Report Status PENDING  Incomplete   Organism ID, Bacteria METHICILLIN RESISTANT STAPHYLOCOCCUS AUREUS  Final      Susceptibility   Methicillin resistant staphylococcus aureus - MIC*    CIPROFLOXACIN >=8 RESISTANT Resistant     ERYTHROMYCIN >=8 RESISTANT Resistant     GENTAMICIN <=0.5 SENSITIVE Sensitive     OXACILLIN >=4 RESISTANT Resistant     TETRACYCLINE >=16  RESISTANT Resistant     VANCOMYCIN <=0.5 SENSITIVE Sensitive     TRIMETH/SULFA >=320 RESISTANT Resistant     CLINDAMYCIN <=0.25 SENSITIVE Sensitive     RIFAMPIN <=0.5 SENSITIVE Sensitive     Inducible Clindamycin NEGATIVE Sensitive     LINEZOLID 2 SENSITIVE Sensitive     * FEW METHICILLIN RESISTANT STAPHYLOCOCCUS AUREUS  Minimum Inhibitory Conc. (1 Drug)     Status: None (Preliminary result)   Collection Time: 12/04/22 10:09 AM  Result Value Ref  Range Status   Min Inhibitory Conc (1 Drug) Preliminary report  Final    Comment: (NOTE) Performed At: [facility] [address] [place], [place] [id number] Jolene Schimke MD ZD:[id number]    Source CRE PENDING  Incomplete  Culture, blood (Routine X 2) w Reflex to ID Panel     Status: None (Preliminary result)   Collection Time: 12/06/22 11:31 AM   Specimen: BLOOD RIGHT HAND  Result Value Ref Range Status   Specimen Description   Final    BLOOD RIGHT HAND Performed at [facility], [address], [place], [place] [address]    Special Requests   Final    AEROBIC BOTTLE ONLY Blood Culture adequate volume Performed at [facility], [address], [place], [place] [address]    Culture   Final    NO GROWTH 2 DAYS Performed at [facility], [address], [place], [place] [address]    Report Status PENDING  Incomplete  Culture, blood (Routine X 2) w Reflex to ID Panel     Status: None (Preliminary result)   Collection Time: 12/06/22 11:31 AM   Specimen: Left Antecubital; Blood  Result Value Ref Range Status   Specimen Description   Final    LEFT ANTECUBITAL BLOOD Performed at [facility], [address], [place], [place] [address]    Special Requests   Final    AEROBIC BOTTLE ONLY Blood Culture adequate volume Performed at [facility], [address], [place], [place] [address]    Culture   Final    NO GROWTH 2 DAYS Performed at [facility], [address] [place], [place] [address]    Report Status PENDING  Incomplete  Body fluid culture w Gram Stain     Status: None   Collection Time: 12/06/22 12:16 PM   Specimen: PATH Cytology Peritoneal fluid  Result Value Ref Range Status   Specimen Description   Final    PERITONEAL Performed at [facility], [address], [place], [place] [address]    Special Requests   Final    NONE Performed at [facility], [address] [place], [place] [address]    Gram Stain   Final    RARE WBC PRESENT, PREDOMINANTLY MONONUCLEAR RARE GRAM POSITIVE COCCI IN PAIRS CRITICAL RESULT CALLED TO, READ BACK BY AND VERIFIED WITH: RN DDot Been [phone] @ [id number] FH Performed at [facility], [address], [place], [place] [address]    Culture FEW METHICILLIN RESISTANT STAPHYLOCOCCUS AUREUS  Final   Report Status 12/08/2022 FINAL  Final   Organism ID, Bacteria METHICILLIN RESISTANT STAPHYLOCOCCUS AUREUS  Final      Susceptibility   Methicillin resistant staphylococcus aureus - MIC*    CIPROFLOXACIN >=8 RESISTANT Resistant     ERYTHROMYCIN >=8 RESISTANT Resistant     GENTAMICIN <=0.5 SENSITIVE Sensitive     OXACILLIN >=4 RESISTANT Resistant     TETRACYCLINE >=16 RESISTANT Resistant     VANCOMYCIN 1 SENSITIVE Sensitive     TRIMETH/SULFA >=320 RESISTANT Resistant     CLINDAMYCIN <=0.25 SENSITIVE Sensitive     RIFAMPIN <=0.5 SENSITIVE Sensitive     Inducible Clindamycin NEGATIVE Sensitive     LINEZOLID 2 SENSITIVE Sensitive     * FEW METHICILLIN RESISTANT STAPHYLOCOCCUS AUREUS     Serology:   Imaging: If present, new imagings (plain films, ct scans, and mri) have been personally visualized and  interpreted; radiology reports have been reviewed. Decision making incorporated into the Impression / Recommendations.   12/06/22 tte  1. Left ventricular ejection fraction, by estimation, is 55 to 60%. The left ventricle has normal function. The left ventricle  has no regional wall motion abnormalities. Left ventricular diastolic parameters were normal.   2. Right ventricular systolic function is normal. The right ventricular size is normal.   3. The mitral valve is normal in structure. No evidence of mitral valve Regurgitation. No evidence of mitral stenosis.   4. The aortic valve is normal in structure. Aortic valve regurgitation is not visualized. No aortic stenosis is present.   5. The inferior vena cava is normal in size with greater than 50% respiratory variability, suggesting right atrial pressure of 3 mmHg.     12/03/22 abd pelv ct 1. Recurrent large volume ascites throughout the abdomen measuring slightly greater than simple fluid. Findings are nonspecific and maybe related to patient's history of endometriosis. 2. Otherwise, no acute abdominopelvic findings.      Raymondo Band, MD [facility] [phone] pager    12/09/2022, 11:15 AM

## 2022-12-09 NOTE — TOC Progression Note (Addendum)
Transition of Care [facility]) - Progression Note    Patient Details  Name: Angelica Blankenship MRN: [id number] Date of Birth: 02-08-1991  [facility]) CM/SW Contact  Larrie Kass, LCSW Phone Number: 12/09/2022, 1:21 PM  Clinical Narrative:     Pt is d/c on IV abx Pam with [facility] is following. Dicussed HH with pt and agreeable. HH RN will be arranged through [facility].  CSW received message from MD ,pt's mother would like pt to stay in hospital while on IV abx. PT eval was ordered . TOC to follow.   ADDEN  4:00pm CSW received a message from PT, requesting to call pt's mother Angelica Blankenship [phone]). CSW spoke with pt's mother Angelica Blankenship, she reports concerns with pt's care. She reports pt's pain is at a 10, and they both do not agree with pt's d/c plan to be on IV abx. Pt's mother stated it is not safe d/c plan. CSW informed pt's mother, that the MD is the one that determines d/c not CSW. CSW informed Pt is not up for d/c at this time. Pt's mother is requesting a transfer to [facility]. CSW informed Ms Angelica Blankenship, that a transfer would have to be medically needed, and suggested she speak with MD about pt's medical concerns.Pt's mother then asked to speak with Patient Experience, CSW transferred pt over to the operator for Patient Experience. TOC to follow.   Expected Discharge Plan: Home w Home Health Services Barriers to Discharge: Continued Medical Work up  Expected Discharge Plan and Services     Post Acute Care Choice: Home Health Living arrangements for the past 2 months: Single Family Home                             HH Agency: [facility] Date [facility] Agency Contacted: 12/09/22   Representative spoke with at [facility] Agency: Cindie   Social Determinants of Health (SDOH) Interventions SDOH Screenings   Food Insecurity: Food Insecurity Present (12/04/2022)  Housing: Low Risk  (12/04/2022)  Transportation Needs: No Transportation Needs (12/04/2022)  Utilities: Not  At Risk (12/04/2022)  Depression (PHQ2-9): Low Risk  (08/03/2018)  Tobacco Use: Medium Risk (12/04/2022)    Readmission Risk Interventions    12/05/2022    1:39 PM  Readmission Risk Prevention Plan  Post Dischage Appt Complete  Medication Screening Complete  Transportation Screening Complete

## 2022-12-09 NOTE — Progress Notes (Signed)
Patient ID: Domenica Fail, female   DOB: 1991/01/16, 32 y.o.   MRN: [id number] S: Feeling better but still with some abdominal discomfort. O:BP 125/80 (BP Location: Right Arm)   Pulse (!) 121   Temp 98.3 F (36.8 C) (Oral)   Resp 18   Ht 5\' 6"  (1.676 m)   Wt 67.2 kg   LMP  (LMP Unknown)   SpO2 97%   BMI 23.91 kg/m   Intake/Output Summary (Last 24 hours) at 12/09/2022 1008 Last data filed at 12/08/2022 1918 Gross per 24 hour  Intake 25.87 ml  Output --  Net 25.87 ml   Intake/Output: I/O last 3 completed shifts: In: 25.9 [I.V.:25.9] Out: 400 [Urine:400]  Intake/Output this shift:  No intake/output data recorded. Weight change:  Gen:NAD CVS: tachy Resp:CTA Abd: +BS, distended, + fluid wave, mild tenderness to palpation, no guarding or rebound Ext: no edema  Recent Labs  Lab 12/03/22 1233 12/03/22 1242 12/04/22 0740 12/04/22 1817 12/05/22 0402 12/05/22 0405 12/06/22 0841 12/07/22 0358 12/08/22 0438 12/09/22 0424  NA 134* 139 135  --   --  134* 137 135 130* 133*  K 3.3* 3.3* 3.3*  --   --  4.2 3.9 4.6 3.6 3.2*  CL 108 108 107  --   --  108 111 108 106 99  CO2 16*  --  17*  --   --  16* 17* 14* 15* 22  GLUCOSE 114* 119* 127*  --   --  115* 74 67* 84 101*  BUN 9 7 15   --   --  19 34* 44* 38* 32*  CREATININE 0.75 0.80 0.89  --   --  1.15* 1.64* 1.91* 1.47* 1.39*  ALBUMIN 3.7  --  3.5  --   --  4.0  --   --  2.2* 2.3*  CALCIUM 8.7*  --  8.6*  --   --  8.0* 8.0* 8.1* 7.9* 8.1*  PHOS  --   --   --  1.0* 3.5  --   --   --  3.1 2.9  AST 16  --  14*  --   --  18  --   --   --   --   ALT 18  --  14  --   --  13  --   --   --   --    Liver Function Tests: Recent Labs  Lab 12/03/22 1233 12/04/22 0740 12/05/22 0405 12/08/22 0438 12/09/22 0424  AST 16 14* 18  --   --   ALT 18 14 13   --   --   ALKPHOS 39 31* 17*  --   --   BILITOT 0.6 0.5 1.2  --   --   PROT 8.0 7.9 6.6  --   --   ALBUMIN 3.7 3.5 4.0 2.2* 2.3*   Recent Labs  Lab 12/03/22 1233 12/04/22 0740   LIPASE 27 23   No results for input(s): "AMMONIA" in the last 168 hours. CBC: Recent Labs  Lab 12/03/22 1233 12/03/22 1242 12/04/22 0740 12/05/22 0405 12/05/22 0807 12/06/22 0841 12/07/22 0358  WBC 9.2  --  4.8 QUESTIONABLE RESULTS, RECOMMEND RECOLLECT TO VERIFY 1.8* 3.1* 4.3  NEUTROABS 8.6*  --  4.3  --   --   --   --   HGB 12.2   < > 11.5* 9.1* 9.1* 10.6* 8.3*  HCT 36.4   < > 34.3* 27.0* 26.8* 31.9* 25.8*  MCV 95.0  --  94.2 93.8 93.1 94.1 95.2  PLT 326  --  314 247 254 258 282   < > = values in this interval not displayed.   Cardiac Enzymes: Recent Labs  Lab 12/08/22 0438  CKTOTAL 26*   CBG: No results for input(s): "GLUCAP" in the last 168 hours.  Iron Studies: No results for input(s): "IRON", "TIBC", "TRANSFERRIN", "FERRITIN" in the last 72 hours. Studies/Results: No results found.  bisacodyl  10 mg Rectal Once   docusate sodium  100 mg Oral BID   metoprolol succinate  25 mg Oral Daily   norethindrone  5 mg Oral BID   potassium chloride  40 mEq Oral Once    BMET    Component Value Date/Time   NA 133 (L) 12/09/2022 0424   K 3.2 (L) 12/09/2022 0424   CL 99 12/09/2022 0424   CO2 22 12/09/2022 0424   GLUCOSE 101 (H) 12/09/2022 0424   BUN 32 (H) 12/09/2022 0424   CREATININE 1.39 (H) 12/09/2022 0424   CALCIUM 8.1 (L) 12/09/2022 0424   GFRNONAA 51 (L) 12/09/2022 0424   GFRAA >60 03/13/2020 1527   CBC    Component Value Date/Time   WBC 4.3 12/07/2022 0358   RBC 2.71 (L) 12/07/2022 0358   HGB 8.3 (L) 12/07/2022 0358   HCT 25.8 (L) 12/07/2022 0358   PLT 282 12/07/2022 0358   MCV 95.2 12/07/2022 0358   MCH 30.6 12/07/2022 0358   MCHC 32.2 12/07/2022 0358   RDW 16.4 (H) 12/07/2022 0358   LYMPHSABS 0.3 (L) 12/04/2022 0740   MONOABS 0.2 12/04/2022 0740   EOSABS 0.0 12/04/2022 0740   BASOSABS 0.0 12/04/2022 0740    Assessment/Plan:  AKI - multifactorial with preceding volume depletion then complicated by IV contrast, 2 large volume paracenteses, IV  NSAID, and IV vancomycin.  She reports that she is making urine but none documented.  Will place order for strict I's/O's.  BUN/Cr markedly improved with IVF's.  UNa was < 10 so resumed IVF's yesterday with isotonic bicarb.  Now off of IVF's.  No indication for dialysis at this time.  Scr improved to 1.39.  Nothing further to add. Will sign off for now.   Feel free to call with any questions or concerns.  If her Scr returns to normal, no need for outpatient follow up. Hold off on any further paracenteses until her renal function starts to improve.  Avoid nephrotoxic medications including NSAIDs and iodinated intravenous contrast exposure unless the latter is absolutely indicated.   Preferred narcotic agents for pain control are hydromorphone, fentanyl, and methadone. Morphine should not be used.  Avoid Baclofen and avoid oral sodium phosphate and magnesium citrate based laxatives / bowel preps.  Continue strict Input and Output monitoring.  Will monitor the patient closely with you and intervene or adjust therapy as indicated by changes in clinical status/labs   Metabolic acidosis - due to #1.  was on lactated ringers but stopped last night.  Will resume IVF's but use isotonic bicarb and follow.   MRSA SBP - ID following.  TTE negative.  Vanco switched to Daptomycin. Recurrent ascites due to stage IV endometriosis - would limit paracenteses as this only exacerbates her AKI.  Low albumin at 2.3, will give a dose of IV albumin 25 grams x 1 to help with ascites.  HTN - stable. Normocytic anemia - transfuse prn  Irena Cords, MD [facility]

## 2022-12-09 NOTE — Progress Notes (Signed)
PHARMACY CONSULT NOTE FOR:  OUTPATIENT  PARENTERAL ANTIBIOTIC THERAPY (OPAT)  Indication: MRSA peritonitis  Regimen: Daptomycin 500 mg every 24 hours  End date: 12/19/22    IV antibiotic discharge orders are pended. To discharging provider:  please sign these orders via discharge navigator,  Select New Orders & click on the button choice - Manage This Unsigned Work.     Thank you for allowing pharmacy to be a part of this patient's care.  Sharin Mons, PharmD, BCPS, BCIDP Infectious Diseases Clinical Pharmacist Phone: [phone] 12/09/2022, 11:18 AM

## 2022-12-09 NOTE — Progress Notes (Addendum)
At bedside for PICC placement. Pt refused placement stating she was "not going home today." Educated pt on the risks/benefits of the PICC line for home antibiotics. Pt adamant about not discharging today, and continues refusing PICC placement. Dr. Sharl Ma aware and will follow up. RN aware.

## 2022-12-09 NOTE — Progress Notes (Addendum)
PT Cancellation Note  Patient Details Name: Angelica Blankenship MRN: [id number] DOB: 1990-07-27   Cancelled Treatment:    Reason Eval/Treat Not Completed: Medical issues which prohibited therapy  Pt c/o significant pain in abdomen that is limiting right now and declines therapy.  She reports has been ambulating to bathroom independently but very painful and HR elevates.  Noted HR 112 bpm at rest. Will f/u at later time for therapy.  Did educate on log roll technique to assist with abdominal pain with transfers.   Pt's mother expressed concern about pain not controlled (notified RN pt requesting meds) and is not pleased with current plan of care (home with IV antibiotics). She asked to speak with a patient advocate.  Notified RN who reports appropriate individuals have been notified.   Anise Salvo, PT Acute Rehab [facility] [phone]   Rayetta Humphrey 12/09/2022, 4:10 PM

## 2022-12-10 ENCOUNTER — Inpatient Hospital Stay (HOSPITAL_COMMUNITY): Payer: BC Managed Care – PPO

## 2022-12-10 ENCOUNTER — Other Ambulatory Visit: Payer: Self-pay

## 2022-12-10 DIAGNOSIS — E876 Hypokalemia: Secondary | ICD-10-CM

## 2022-12-10 LAB — RENAL FUNCTION PANEL
Albumin: 2.3 g/dL — ABNORMAL LOW (ref 3.5–5.0)
Anion gap: 13 (ref 5–15)
BUN: 25 mg/dL — ABNORMAL HIGH (ref 6–20)
CO2: 21 mmol/L — ABNORMAL LOW (ref 22–32)
Calcium: 8.1 mg/dL — ABNORMAL LOW (ref 8.9–10.3)
Chloride: 99 mmol/L (ref 98–111)
Creatinine, Ser: 1.17 mg/dL — ABNORMAL HIGH (ref 0.44–1.00)
GFR, Estimated: >60 mL/min (ref >60)
Glucose, Bld: 85 mg/dL (ref 70–99)
Phosphorus: 2.7 mg/dL (ref 2.5–4.6)
Potassium: 3.3 mmol/L — ABNORMAL LOW (ref 3.5–5.1)
Sodium: 133 mmol/L — ABNORMAL LOW (ref 135–145)

## 2022-12-10 LAB — CBC
HCT: 24.1 % — ABNORMAL LOW (ref 36.0–46.0)
Hemoglobin: 8.5 g/dL — ABNORMAL LOW (ref 12.0–15.0)
MCH: 30.7 pg (ref 26.0–34.0)
MCHC: 35.3 g/dL (ref 30.0–36.0)
MCV: 87 fL (ref 80.0–100.0)
Platelets: 104 10*3/uL — ABNORMAL LOW (ref 150–400)
RBC: 2.77 MIL/uL — ABNORMAL LOW (ref 3.87–5.11)
RDW: 15.3 % (ref 11.5–15.5)
WBC: 9.5 10*3/uL (ref 4.0–10.5)
nRBC: 0 % (ref 0.0–0.2)

## 2022-12-10 LAB — MINIMUM INHIBITORY CONC. (1 DRUG)

## 2022-12-10 LAB — MIC RESULT

## 2022-12-10 MED ORDER — VANCOMYCIN HCL 750 MG/150ML IV SOLN
750.0000 mg | Freq: Two times a day (BID) | INTRAVENOUS | Status: DC
  Administered 2022-12-11 – 2022-12-12 (×3): 750 mg via INTRAVENOUS
  Filled 2022-12-10 (×3): qty 150

## 2022-12-10 MED ORDER — SODIUM CHLORIDE 0.9% FLUSH: 10.0000 mL | INTRAVENOUS | Status: DC | PRN

## 2022-12-10 MED ORDER — SODIUM CHLORIDE 0.9% FLUSH: 9.0000 mL | INTRAVENOUS | Status: DC | PRN

## 2022-12-10 MED ORDER — HYDROMORPHONE HCL 1 MG/ML IJ SOLN
1.0000 mg | Freq: Once | INTRAMUSCULAR | Status: AC
  Administered 2022-12-10: 1 mg via INTRAVENOUS
  Filled 2022-12-10: qty 1

## 2022-12-10 MED ORDER — NALOXONE HCL 0.4 MG/ML IJ SOLN: 0.4000 mg | INTRAMUSCULAR | Status: DC | PRN

## 2022-12-10 MED ORDER — ONDANSETRON HCL 4 MG/2ML IJ SOLN
4.0000 mg | Freq: Four times a day (QID) | INTRAMUSCULAR | Status: DC | PRN
  Administered 2022-12-11 – 2022-12-12 (×3): 4 mg via INTRAVENOUS
  Filled 2022-12-10 (×3): qty 2

## 2022-12-10 MED ORDER — FLEET ENEMA 7-19 GM/118ML RE ENEM
1.0000 | ENEMA | Freq: Every day | RECTAL | Status: DC | PRN
  Administered 2022-12-10: 1 via RECTAL
  Filled 2022-12-10: qty 1

## 2022-12-10 MED ORDER — POTASSIUM CHLORIDE 20 MEQ PO PACK
40.0000 meq | PACK | Freq: Once | ORAL | Status: AC
  Administered 2022-12-10: 40 meq via ORAL
  Filled 2022-12-10: qty 2

## 2022-12-10 MED ORDER — HYDROMORPHONE 1 MG/ML IV SOLN
INTRAVENOUS | Status: DC
  Administered 2022-12-10: 30 mg via INTRAVENOUS
  Administered 2022-12-11: 4.5 mg via INTRAVENOUS
  Administered 2022-12-11: 3.6 mg via INTRAVENOUS
  Administered 2022-12-12: 3 mg via INTRAVENOUS
  Administered 2022-12-12: 30 mg via INTRAVENOUS
  Administered 2022-12-12: 3.3 mL via INTRAVENOUS
  Administered 2022-12-12: 2.7 mL via INTRAVENOUS
  Administered 2022-12-12: 1.2 mg via INTRAVENOUS
  Administered 2022-12-12: 1.5 mL via INTRAVENOUS
  Filled 2022-12-10 (×2): qty 30

## 2022-12-10 MED ORDER — ACETAMINOPHEN 325 MG PO TABS
650.0000 mg | ORAL_TABLET | ORAL | Status: DC | PRN
  Administered 2022-12-10 – 2022-12-12 (×7): 650 mg via ORAL
  Filled 2022-12-10 (×7): qty 2

## 2022-12-10 MED ORDER — DIPHENHYDRAMINE HCL 12.5 MG/5ML PO ELIX: 12.5000 mg | ORAL_SOLUTION | Freq: Four times a day (QID) | ORAL | Status: DC | PRN

## 2022-12-10 MED ORDER — CHLORHEXIDINE GLUCONATE CLOTH 2 % EX PADS
6.0000 | MEDICATED_PAD | Freq: Every day | CUTANEOUS | Status: DC
  Administered 2022-12-10 – 2022-12-12 (×3): 6 via TOPICAL

## 2022-12-10 MED ORDER — SENNOSIDES-DOCUSATE SODIUM 8.6-50 MG PO TABS
1.0000 | ORAL_TABLET | Freq: Two times a day (BID) | ORAL | Status: DC
  Administered 2022-12-10 – 2022-12-12 (×4): 1 via ORAL
  Filled 2022-12-10 (×5): qty 1

## 2022-12-10 MED ORDER — DIPHENHYDRAMINE HCL 50 MG/ML IJ SOLN: 12.5000 mg | Freq: Four times a day (QID) | INTRAMUSCULAR | Status: DC | PRN

## 2022-12-10 MED ORDER — ACETAMINOPHEN 650 MG RE SUPP: 650.0000 mg | RECTAL | Status: DC | PRN

## 2022-12-10 MED ORDER — ORAL CARE MOUTH RINSE: 15.0000 mL | OROMUCOSAL | Status: DC | PRN

## 2022-12-10 NOTE — Progress Notes (Signed)
Patient's pain is not well controlled, will start Dilaudid PCA. Will discontinue po oxycodone and prn IV dilaudid.

## 2022-12-10 NOTE — Progress Notes (Signed)
Mother is at bedside, expressing routinely her dissatisfaction of care.  Mother dictates patient care and treatment plan.  Manipulates and dominates the conversation with the doctor.  Physician tries to educate patient and mother on safety of pain medication, treatment plan and discharge needs.  Mother cuts physician off mid sentence, threatens to transfer to [facility] not hearing any of the conversation from physician.  Mother wants staff to "wake" patient to give her pain medication.  Mother has been educated on pain scales and how they are used for safety.  This nurse witnessed   physician advocating for the safety of his patient.

## 2022-12-10 NOTE — Progress Notes (Addendum)
[facility]  PROGRESS NOTE  Angelica Blankenship HQI:[id number] DOB: 07/31/1990 DOA: 12/04/2022 PCP: Marcine Matar, MD   Brief HPI:   32 year old female with history of esophagus, endometriosis, adnexal mass, recurrent ascites with last paracentesis in February this year, asymptomatic heart murmur, hypertension, pulm nodule, normocytic anemia came to hospital for abdominal pain.  4 L of fluid was removed in the ER via paracentesis.  Culture grew MRSA, ID was consulted.    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    Abdominal pain -Abdominal exam is benign -Secondary to above -CT abdomen/pelvis obtained on 12/03/2022 was unremarkable -Patient continues to get Dilaudid 2 mg IV every 6hours as needed -Started oxycodone 10 mg every 3 hours as needed for pain -She is very somnolent, will discontinue Dilaudid  Acute kidney injury -Creatinine 1.91; currently improving with IV fluids -Today creatinine is 1.39 -Likely from intravascular volume depletion; after paracentesis, emesis -UA shows proteinuria, hematuria -IV fluids changed to isotonic bicarb as per nephrology -Discussed with nephrology, likely multifactorial from intravascular volume depletion  from paracentesis, emesis, Toradol, vancomycin  Constipation -got enema this morning with good effect -Started on Senokot-S tablet 1 tablet p.o. twice daily   History of endometriosis No need for GYN at the moment per GYN on-call. (Dr. Duane Lope)- will reach out to doc at [facility] -restarted hormonal treatment   Essential hypertension -Currently on metoprolol 25 mg daily, started in the ED  Sinus tachycardia -Patient having sinus tachycardia during this hospital admission -Secondary to fluid shift after paracentesis and intravascular depletion -Has been getting IV fluids in the hospital, she is +8 L volume overload -Currently on isotonic bicarb infusion at 100 mL/h -She is also on metoprolol for hypertension as above -Patient's heart rate is a physiologic response to her severe dehydration as well as SBP along with abdominal pain -Hopefully heart rate should improve with antibiotics and IV fluids -Continue to monitor on telemetry   Normocytic anemia -Hemoglobin dropped to 8.9 as of 12/07/2022 -Likely dilutional effect, no evidence of black stool or bleeding -This morning hemoglobin is 8.5, platelet count 104 Transfuse as needed. -Follow CBC in a.m.   Hypokalemia -Potassium is 3.3 -Replace potassium and follow BMP in am   Hypomagnesemia/hypophosphatemia  -Replete  Thrombocytopenia - Platelet count has dropped  to 104,000 this morning -?  Drug-induced from daptomycin -It was 282 on 12/07/2022; the daptomycin was started -Discussed with  ID Dr Ilsa Iha, will switch from Daptomycin to Vancomycin. -Will monitor CBC in a.m.  Medications     docusate sodium  100 mg Oral BID   metoprolol succinate  25 mg Oral Daily   norethindrone  5 mg Oral BID   senna-docusate  1 tablet Oral BID     Data Reviewed:   CBG:  No results for input(s): "GLUCAP" in the last 168 hours.  SpO2: 96 % O2 Flow Rate (  L/min): 1 L/min    Vitals:   12/09/22 1245 12/09/22 2103 12/10/22 0500  12/10/22 0619  BP: 137/81 (!) 146/90  (!) 153/89  Pulse: (!) 125 (!) 120  (!) 117  Resp: (!) 22 (!) 22  (!) 25  Temp: 98.6 F (37 C) 98.6 F (37 C)  98.8 F (37.1 C)  TempSrc: Oral Oral  Oral  SpO2: 92%   96%  Weight:   70.8 kg   Height:          Data Reviewed:  Basic Metabolic Panel: Recent Labs  Lab 12/04/22 1817 12/05/22 0402 12/05/22 0405 12/06/22 0841 12/07/22 0358 12/08/22 0438 12/09/22 0424 12/10/22 0436  NA  --   --    < > 137 135 130* 133* 133*  K  --   --    < > 3.9 4.6 3.6 3.2* 3.3*  CL  --   --    < > 111 108 106 99 99  CO2  --   --    < > 17* 14* 15* 22 21*  GLUCOSE  --   --    < > 74 67* 84 101* 85  BUN  --   --    < > 34* 44* 38* 32* 25*  CREATININE  --   --    < > 1.64* 1.91* 1.47* 1.39* 1.17*  CALCIUM  --   --    < > 8.0* 8.1* 7.9* 8.1* 8.1*  MG 1.6* 2.3  --   --   --   --   --   --   PHOS 1.0* 3.5  --   --   --  3.1 2.9 2.7   < > = values in this interval not displayed.    CBC: Recent Labs  Lab 12/03/22 1233 12/03/22 1242 12/04/22 0740 12/05/22 0405 12/05/22 0807 12/06/22 0841 12/07/22 0358 12/10/22 0436  WBC 9.2  --  4.8 QUESTIONABLE RESULTS, RECOMMEND RECOLLECT TO VERIFY 1.8* 3.1* 4.3 9.5  NEUTROABS 8.6*  --  4.3  --   --   --   --   --   HGB 12.2   < > 11.5* 9.1* 9.1* 10.6* 8.3* 8.5*  HCT 36.4   < > 34.3* 27.0* 26.8* 31.9* 25.8* 24.1*  MCV 95.0  --  94.2 93.8 93.1 94.1 95.2 87.0  PLT 326  --  314 247 254 258 282 104*   < > = values in this interval not displayed.    LFT Recent Labs  Lab 12/03/22 1233 12/04/22 0740 12/05/22 0405 12/08/22 0438 12/09/22 0424 12/10/22 0436  AST 16 14* 18  --   --   --   ALT 18 14 13   --   --   --   ALKPHOS 39 31* 17*  --   --   --   BILITOT 0.6 0.5 1.2  --   --   --   PROT 8.0 7.9 6.6  --   --   --   ALBUMIN 3.7 3.5 4.0 2.2* 2.3* 2.3*     Antibiotics: Anti-infectives (From admission, onward)    Start     Dose/Rate Route Frequency Ordered Stop   12/07/22 1400  DAPTOmycin (CUBICIN) 500  mg in sodium chloride 0.9 % IVPB        500 mg 120 mL/hr over 30 Minutes Intravenous Daily 12/07/22 0804     12/07/22 0000  vancomycin (VANCOCIN) IVPB 1000 mg/200 mL premix  Status:  Discontinued  1,000 mg 200 mL/hr over 60 Minutes Intravenous Every 24 hours 12/06/22 1002 12/07/22 0804   12/06/22 0300  vancomycin (VANCOREADY) IVPB 750 mg/150 mL  Status:  Discontinued        750 mg 150 mL/hr over 60 Minutes Intravenous Every 12 hours 12/05/22 1354 12/06/22 1001   12/05/22 1445  vancomycin (VANCOREADY) IVPB 1250 mg/250 mL        1,250 mg 166.7 mL/hr over 90 Minutes Intravenous  Once 12/05/22 1354 12/05/22 1743   12/04/22 1245  cefTRIAXone (ROCEPHIN) 2 g in sodium chloride 0.9 % 100 mL IVPB  Status:  Discontinued        2 g 200 mL/hr over 30 Minutes Intravenous Every 24 hours 12/04/22 1231 12/05/22 1348        DVT prophylaxis: SCDs  Code Status: Full code  Family Communication: Discussed with patient's mother at bedside   CONSULTS ID   Subjective    Patient refused PICC line yesterday, however agreeable today.  Lost IV access this morning.  Very somnolent this morning, she did receive Dilaudid in addition to oxycodone 10 mg every 3 hours as needed.  Objective    Physical Examination:   General-appears in no acute distress Heart-S1-S2, regular, no murmur auscultated Lungs-clear to auscultation bilaterally, no wheezing or crackles auscultated Abdomen-soft, nontender, no organomegaly Extremities-no edema in the lower extremities Neuro-somnolent but arousable,  oriented x3, no focal deficit noted   Status is: Inpatient:             Meredeth Ide   [facility] If 7PM-7AM, please contact night-coverage at [email or web address], Office  [phone]   12/10/2022, 9:48 AM  LOS: 6 days

## 2022-12-10 NOTE — Progress Notes (Addendum)
PT Cancellation Note  Patient Details Name: QUITA MCGRORY MRN: [id number] DOB: Jan 04, 1991   Cancelled Treatment:    Reason Eval/Treat Not Completed: Pain limiting ability to participate. Pt reports pain 7/10, currently declines PT eval due to pain. Noted HR 111 while supine in bed. Pt's mom requesting therapy check back later for PT eval. Per pt and pt's mom, pt was independent and working prior to hospitalization, pt has been transferring to [facility] without assist and not ambulating to restroom due to vitals machine lines and urgency for restroom. Will check back for eval as able.    Domenick Bookbinder PT, DPT 12/10/22, 10:32 AM

## 2022-12-10 NOTE — Progress Notes (Signed)
Peripherally Inserted Central Catheter Placement  The IV Nurse has discussed with the patient and/or persons authorized to consent for the patient, the purpose of this procedure and the potential benefits and risks involved with this procedure.  The benefits include less needle sticks, lab draws from the catheter, and the patient may be discharged home with the catheter. Risks include, but not limited to, infection, bleeding, blood clot (thrombus formation), and puncture of an artery; nerve damage and irregular heartbeat and possibility to perform a PICC exchange if needed/ordered by physician.  Alternatives to this procedure were also discussed.  Bard Power PICC patient education guide, fact sheet on infection prevention and patient information card has been provided to patient /or left at bedside.    PICC Placement Documentation  PICC Single Lumen 12/10/22 Right Brachial 36 cm 2 cm (Active)  Indication for Insertion or Continuance of Line Home intravenous therapies (PICC only) 12/10/22 1143  Exposed Catheter (cm) 2 cm 12/10/22 1143  Site Assessment Clean, Dry, Intact 12/10/22 1143  Line Status Flushed;Saline locked;Blood return noted 12/10/22 1143  Dressing Type Transparent;Securing device 12/10/22 1143  Dressing Status Antimicrobial disc in place;Clean, Dry, Intact 12/10/22 1143  Safety Lock Not Applicable 12/10/22 1143  Line Care Connections checked and tightened 12/10/22 1143  Line Adjustment (NICU/IV Team Only) No 12/10/22 1143  Dressing Intervention New dressing 12/10/22 1143  Dressing Change Due 12/17/22 12/10/22 1143       Elliot Dally 12/10/2022, 11:44 AM

## 2022-12-10 NOTE — Progress Notes (Signed)
Spoke to GYN Dr. Connye Burkitt, on-call for [facility] long.  Discussed that patient pain is not controlled with oxycodone and Dilaudid.  She recommended to get transvaginal/pelvic ultrasound, and she will see patient in AM.  In the meantime we will keep on treating her with as needed Dilaudid.

## 2022-12-10 NOTE — Progress Notes (Signed)
Patient and her mother expressed the desire to be transferred to [facility] in [place].  Patient's gynecologist Dr. Linna Hoff had told the patient that she could be transferred to their service.  Called and discussed with transfer coordinator, got a call back from gynecologist on-call Dr. Lum Keas, she has accepted the patient in transfer under gynecology service. Unfortunately there are no beds available at this time at [facility] Patient is on waiting list for transfer to [facility].

## 2022-12-10 NOTE — Progress Notes (Signed)
Pharmacy Antibiotic Note  Angelica Blankenship is a 32 y.o. female admitted on 12/04/2022 with spontaneous bacterial peritonitis. Culture grew MSA.  Pharmacy has been consulted for Vancomycin dosing.  Plan: - Vancomycin 750mg   IV every 12 hours (estimated AUC: 511) - Measure Vancomycin levels as needed. Goal AUC: 400-550 - Follow up renal function, culture results, and clinical course.  Height: 5\' 6"  (167.6 cm) Weight: 70.8 kg (156 lb 1.4 oz) IBW/kg (Calculated) : 59.3  Temp (24hrs), Avg:98.7 F (37.1 C), Min:98.6 F (37 C), Max:98.8 F (37.1 C)  Recent Labs  Lab 12/04/22 1818 12/04/22 2053 12/05/22 0405 12/05/22 0807 12/06/22 0841 12/07/22 0358 12/08/22 0438 12/09/22 0424 12/10/22 0436  WBC  --   --  QUESTIONABLE RESULTS, RECOMMEND RECOLLECT TO VERIFY 1.8* 3.1* 4.3  --   --  9.5  CREATININE  --   --  1.15*  --  1.64* 1.91* 1.47* 1.39* 1.17*  LATICACIDVEN 1.7 1.5  --   --   --   --   --   --   --     Estimated Creatinine Clearance: 64 mL/min (A) (by C-G formula based on SCr of 1.17 mg/dL (H)).    No Known Allergies  Antimicrobials this admission: 6/3 Vancomycin  >> 6/5    6/5 Daptomycin >> 6/8  Restart 6/8 Vancomycin >>  Microbiology results: 6/2 Peritoneal Cx: MSA 6/4 Peritoneal Cx: MRSA 6/4 BCx: ngtd   Thank you for allowing pharmacy to be a part of this patient's care.  Misbah Hornaday Tylene Fantasia 12/10/2022 7:59 PM

## 2022-12-11 DIAGNOSIS — K652 Spontaneous bacterial peritonitis: Secondary | ICD-10-CM | POA: Diagnosis not present

## 2022-12-11 LAB — RENAL FUNCTION PANEL
Albumin: 2.4 g/dL — ABNORMAL LOW (ref 3.5–5.0)
Anion gap: 10 (ref 5–15)
BUN: 19 mg/dL (ref 6–20)
CO2: 22 mmol/L (ref 22–32)
Calcium: 8.4 mg/dL — ABNORMAL LOW (ref 8.9–10.3)
Chloride: 100 mmol/L (ref 98–111)
Creatinine, Ser: 1.09 mg/dL — ABNORMAL HIGH (ref 0.44–1.00)
GFR, Estimated: >60 mL/min (ref >60)
Glucose, Bld: 83 mg/dL (ref 70–99)
Phosphorus: 3.5 mg/dL (ref 2.5–4.6)
Potassium: 3.8 mmol/L (ref 3.5–5.1)
Sodium: 132 mmol/L — ABNORMAL LOW (ref 135–145)

## 2022-12-11 LAB — CBC
HCT: 23.3 % — ABNORMAL LOW (ref 36.0–46.0)
Hemoglobin: 8.2 g/dL — ABNORMAL LOW (ref 12.0–15.0)
MCH: 31.2 pg (ref 26.0–34.0)
MCHC: 35.2 g/dL (ref 30.0–36.0)
MCV: 88.6 fL (ref 80.0–100.0)
Platelets: 151 10*3/uL (ref 150–400)
RBC: 2.63 MIL/uL — ABNORMAL LOW (ref 3.87–5.11)
RDW: 15.9 % — ABNORMAL HIGH (ref 11.5–15.5)
WBC: 12.2 10*3/uL — ABNORMAL HIGH (ref 4.0–10.5)
nRBC: 0 % (ref 0.0–0.2)

## 2022-12-11 LAB — CULTURE, BLOOD (ROUTINE X 2)
Culture: NO GROWTH
Special Requests: ADEQUATE

## 2022-12-11 LAB — MAGNESIUM: Magnesium: 1.7 mg/dL (ref 1.7–2.4)

## 2022-12-11 MED ORDER — DIAZEPAM 5 MG PO TABS
10.0000 mg | ORAL_TABLET | Freq: Once | ORAL | Status: AC
  Administered 2022-12-11: 10 mg via ORAL
  Filled 2022-12-11: qty 2

## 2022-12-11 MED ORDER — GABAPENTIN 100 MG PO CAPS
100.0000 mg | ORAL_CAPSULE | Freq: Three times a day (TID) | ORAL | Status: DC
  Administered 2022-12-11 – 2022-12-12 (×4): 100 mg via ORAL
  Filled 2022-12-11 (×4): qty 1

## 2022-12-11 MED ORDER — LIDOCAINE 5 % EX PTCH
1.0000 | MEDICATED_PATCH | Freq: Every day | CUTANEOUS | Status: DC
  Administered 2022-12-11 – 2022-12-12 (×2): 1 via TRANSDERMAL
  Filled 2022-12-11 (×2): qty 1

## 2022-12-11 MED ORDER — FLEET ENEMA 7-19 GM/118ML RE ENEM
1.0000 | ENEMA | Freq: Once | RECTAL | Status: DC
  Filled 2022-12-11: qty 1

## 2022-12-11 MED ORDER — CYCLOBENZAPRINE HCL 5 MG PO TABS
5.0000 mg | ORAL_TABLET | Freq: Three times a day (TID) | ORAL | Status: DC | PRN
  Administered 2022-12-11: 5 mg via ORAL
  Filled 2022-12-11: qty 1

## 2022-12-11 MED ORDER — LIDOCAINE 5 % EX PTCH
2.0000 | MEDICATED_PATCH | Freq: Every day | CUTANEOUS | Status: DC
  Administered 2022-12-11 – 2022-12-12 (×2): 2 via TRANSDERMAL
  Filled 2022-12-11 (×2): qty 2

## 2022-12-11 NOTE — Progress Notes (Signed)
PT Cancellation Note  Patient Details Name: Angelica Blankenship MRN: [id number] DOB: 10-14-1990   Cancelled Treatment:    Reason Eval/Treat Not Completed: Pain limiting ability to participate Pt has PCA.  Declines to participate today, wants to get pain under control.   Janan Halter Payson 12/11/2022, 9:46 AM Paulino Door, DPT Physical Therapist Acute Rehabilitation Services Office: [phone]

## 2022-12-11 NOTE — Progress Notes (Addendum)
[facility]  PROGRESS NOTE  Angelica Blankenship IRS:[id number] DOB: 05-27-1991 DOA: 12/04/2022 PCP: Marcine Matar, MD   Brief HPI:   32 year old female with history of esophagus, endometriosis, adnexal mass, recurrent ascites with last paracentesis in February this year, asymptomatic heart murmur, hypertension, pulm nodule, normocytic anemia came to hospital for abdominal pain.  4 L of fluid was removed in the ER via paracentesis.  Culture grew MRSA, ID was consulted.    Assessment/Plan:   Spontaneous bacterial peritonitis - ascites from endometriosis -Culture grew MRSA, started on vancomycin -ID consulted; antibiotics were changed daptomycin -Patient developed thrombocytopenia on 12/10/2022.  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 and also has multiple/complaints, rings placed several months prior which could be site of injury, bacterial translocation to blood -CT abdomen/pelvis unremarkable, no abscess at umbilicus -Ascitic fluid showed few MRSA sensitive to penicillin, 97% neutrophils; blood culture pending -TTE is unremarkable -PICC line placed  Bilateral lower extremity edema -Check venous duplex of lower extremity to rule out DVT  Abdominal pain/stage IV endometriosis -Abdominal exam is benign -Secondary to SBP and endometriosis -CT abdomen/pelvis obtained on 12/03/2022 was unremarkable -Pelvic ultrasound obtained on 12/10/2022 was unremarkable -Continue Dilaudid PCA -Patient still has uncontrolled pain despite being on Dilaudid PCA, will consult palliative care for further help with pain management   Acute kidney injury -Creatinine 1.91; currently improving with IV fluids -Today creatinine is 1.0 6 9 -Likely from intravascular volume depletion; after paracentesis, emesis -UA shows proteinuria, hematuria -IV fluids changed to  isotonic bicarb as per nephrology -Discussed with nephrology, likely multifactorial from intravascular volume depletion from paracentesis, emesis, Toradol, vancomycin  Constipation -got enema this morning with good effect -Started on Senokot-S tablet 1 tablet p.o. twice daily   History of endometriosis No need for GYN at the moment per GYN on-call. (Dr. Duane Lope)- will reach out to doc at [facility] -restarted hormonal treatment -Discussed with GYN on-call Dr. Connye Burkitt, GYN will see patient today for endometriosis   Essential hypertension -Currently on metoprolol 25 mg daily, started in the ED  Sinus tachycardia -Patient having sinus tachycardia during this hospital admission -Secondary to fluid shift after paracentesis and intravascular depletion, uncontrolled pain -Has been getting IV fluids in the hospital --Examined discontinued -She is also on metoprolol for hypertension as above -Continue to monitor on telemetry   Normocytic anemia -Hemoglobin dropped to 8.2 as of 12/07/2022 -Likely dilutional effect, no evidence of black stool or bleeding Transfuse as needed. -Follow CBC in a.m.   Hypokalemia -Potassium is 3.8 -Replace potassium and follow BMP in am   Hypomagnesemia/hypophosphatemia  -Replete  Thrombocytopenia - Platelet count has dropped  to 104,000 this morning -?  Drug-induced from daptomycin -It was 282 on 12/07/2022; the daptomycin was started -Discussed with  ID Dr Ilsa Iha, will switch from Daptomycin to Vancomycin. -Blood count has improved to 151 -Daptomycin has been discontinued  Family expressed wish to be transferred to [facility].  Awaiting bed at the facility.  She has been accepted for transfer by Dr. Lum Keas under gynecological service  Medications     Chlorhexidine Gluconate Cloth  6 each Topical Daily   docusate sodium  100 mg Oral BID   HYDROmorphone   Intravenous Q4H   metoprolol succinate  25 mg Oral Daily   norethindrone  5 mg Oral BID    senna-docusate  1 tablet Oral BID  Data Reviewed:   CBG:  No results for input(s): "GLUCAP" in the last 168 hours.  SpO2: 98 % O2 Flow Rate (L/min): 1 L/min FiO2 (%): 32 %    Vitals:   12/11/22 0408 12/11/22 0637 12/11/22 0700 12/11/22 0823  BP: (!) 145/108 139/73    Pulse:   (!) 115   Resp:   20 18  Temp: 98.2 F (36.8 C) 98.9 F (37.2 C)    TempSrc: Oral Oral    SpO2:   97% 98%  Weight:      Height:          Data Reviewed:  Basic Metabolic Panel: Recent Labs  Lab 12/04/22 1817 12/05/22 0402 12/05/22 0405 12/07/22 0358 12/08/22 0438 12/09/22 0424 12/10/22 0436 12/11/22 0233  NA  --   --    < > 135 130* 133* 133* 132*  K  --   --    < > 4.6 3.6 3.2* 3.3* 3.8  CL  --   --    < > 108 106 99 99 100  CO2  --   --    < > 14* 15* 22 21* 22  GLUCOSE  --   --    < > 67* 84 101* 85 83  BUN  --   --    < > 44* 38* 32* 25* 19  CREATININE  --   --    < > 1.91* 1.47* 1.39* 1.17* 1.09*  CALCIUM  --   --    < > 8.1* 7.9* 8.1* 8.1* 8.4*  MG 1.6* 2.3  --   --   --   --   --  1.7  PHOS 1.0* 3.5  --   --  3.1 2.9 2.7 3.5   < > = values in this interval not displayed.    CBC: Recent Labs  Lab 12/05/22 0807 12/06/22 0841 12/07/22 0358 12/10/22 0436 12/11/22 0233  WBC 1.8* 3.1* 4.3 9.5 12.2*  HGB 9.1* 10.6* 8.3* 8.5* 8.2*  HCT 26.8* 31.9* 25.8* 24.1* 23.3*  MCV 93.1 94.1 95.2 87.0 88.6  PLT 254 258 282 104* 151    LFT Recent Labs  Lab 12/05/22 0405 12/08/22 0438 12/09/22 0424 12/10/22 0436 12/11/22 0233  AST 18  --   --   --   --   ALT 13  --   --   --   --   ALKPHOS 17*  --   --   --   --   BILITOT 1.2  --   --   --   --   PROT 6.6  --   --   --   --   ALBUMIN 4.0 2.2* 2.3* 2.3* 2.4*     Antibiotics: Anti-infectives (From admission, onward)    Start     Dose/Rate Route Frequency Ordered Stop   12/11/22 1400  vancomycin (VANCOREADY) IVPB 750 mg/150 mL        750 mg 150 mL/hr over 60 Minutes Intravenous Every 12 hours 12/10/22 1959      12/07/22 1400  DAPTOmycin (CUBICIN) 500 mg in sodium chloride 0.9 % IVPB  Status:  Discontinued        500 mg 120 mL/hr over 30 Minutes Intravenous Daily 12/07/22 0804 12/10/22 1902   12/07/22 0000  vancomycin (VANCOCIN) IVPB 1000 mg/200 mL premix  Status:  Discontinued        1,000 mg 200 mL/hr over 60 Minutes Intravenous Every 24 hours 12/06/22 1002 12/07/22 0804  12/06/22 0300  vancomycin (VANCOREADY) IVPB 750 mg/150 mL  Status:  Discontinued        750 mg 150 mL/hr over 60 Minutes Intravenous Every 12 hours 12/05/22 1354 12/06/22 1001   12/05/22 1445  vancomycin (VANCOREADY) IVPB 1250 mg/250 mL        1,250 mg 166.7 mL/hr over 90 Minutes Intravenous  Once 12/05/22 1354 12/05/22 1743   12/04/22 1245  cefTRIAXone (ROCEPHIN) 2 g in sodium chloride 0.9 % 100 mL IVPB  Status:  Discontinued        2 g 200 mL/hr over 30 Minutes Intravenous Every 24 hours 12/04/22 1231 12/05/22 1348        DVT prophylaxis: SCDs  Code Status: Full code  Family Communication: Discussed with patient's mother at bedside   CONSULTS ID   Subjective    Patient started on Dilaudid PCA last night.  Pain fairly well-controlled.  Awaiting bed at [facility].  Objective    Physical Examination:  General-appears in no acute distress Heart-S1-S2, regular, no murmur auscultated Lungs-clear to auscultation bilaterally, no wheezing or crackles auscultated Abdomen-soft, nontender, no organomegaly Extremities-no edema in the lower extremities Neuro-alert, oriented x3, no focal deficit noted   Status is: Inpatient:             Meredeth Ide   [facility] If 7PM-7AM, please contact night-coverage at [email or web address], Office  [phone]   12/11/2022, 9:58 AM  LOS: 7 days

## 2022-12-11 NOTE — Consult Note (Signed)
OBSTETRICS AND GYNECOLOGY ATTENDING CONSULT NOTE  Consult Date: 12/11/2022 Reason for Consult: Persistent pain in s/o endometriosis Consulting Provider: Dr. Mauro Kaufmann   Assessment: Extensive conversation with patient and her family today. Most likely etiology of her pain is related to spontaneous bacterial peritonitis. Her pain may be more severe due to baseline inflammation from her endometriosis.   I reviewed with the patient & her family that this is no endometriosis-specific pain medication that will give her immediate relief and that we may need more temporizing measures until her acute infection resolves. Long-term options for management of her endometriosis-related pain could include a higher dose of aygestin, GnRH agonist/antagonists, or surgery but these things are typically coordinated as an outpatient.  Patient also appears to have some neuropathic/MSK components of her pain.   We discussed a multimodal approach to pain management to try to target multiple pain pathways and achieve better control of her symptoms in the short term.   Plan: - Increase aygestin (norethindrone) to 15mg /day - If Lupron is available, could consider inpatient injection but is unlikely to control symptoms quickly - Add scheduled gabapentin & flexeril prn - Trial vaginal valium 10mg  x 1 to address pelvic floor muscle spasm. If efficacious & well tolerated, can order vaginal valium 10mg  TID prn  Appreciate care of Domenica Fail by her primary team. Will sign off at this time.   Please call [phone] [facility] OB/GYN Consult Attending Monday-Friday 8am - 5pm) or [phone] [facility] OB/GYN Attending On Call all day, every day) for any new gynecologic concern.  Thank you for involving [place] in the care of this patient.  Total consultation time including face-to-face time with patient (>50% of time), reviewing chart and documentation: 90 minutes  Harvie Bridge, MD Obstetrician & Gynecologist, [facility] for [facility], [facility] Consult Phone: [phone]  History of Present Illness: Angelica Blankenship is an 32 y.o. G2P0020 with history of endometriosis who is admitted for abdominal pain & spontaneous bacterial peritonitis in s/o known endometriosis.   Pt has longstanding history of endometriosis: - dx 06/18/15: diagnostic laparoscopy & excision of endo with Dr. Cherly Hensen; performed for 1 year of pain off birth control - 2021 p/w ascites. Evaluated by GI, cardiology & gyn without clear source. Referred to gyn onc for possible malignancy iso complex R ovarian cyst & 3cm umbilical mass - 03/19/20: diagnostic laparoscopy, fulguration of endometriosis with Dr. Pricilla Holm (gyn onc). Confirmed the presence of 2L bloody ascites, stage IV endometriosis with an obliterated cul-de-sac, diaphragmatic implants, and infra & periumbilical implants. Ovaries were visually normal. Path from periumbilical nodule c/w endometriosis & cytology of the ascites fluid benign - 03/31/20: referred to [facility], started Lupron w/ good control of pain. Pt reports tolerating Lupron without major issues. Stopped 2022 after maximum duration of therapy - has undergone paracenteses for symptomatic relief since 2021 due to recurrent ascites - now following with [facility] through [facility]. She is managing her endometriosis with Aygestin 5mg  daily (reports starting ~ 45 days ago) and extra strength excedrin prn. Reports that control of her symptoms was pretty good recently. Per [facility] documentation 5/23, she desires future fertility.   Pt reports that her pain started 10 days ago. She presented to [facility] ED on 6/1 where CT A/P revealed large volume ascites with no other acute findings. She was admitted on 6/2 and underwent paracentesis (6/4) that yielded 4L of bloody fluid. She is now being treated for spontaneous bacterial peritonitis (culture +MRSA)  with vancomycin. Due to persistent pain, she also completed at  pelvic US yesterday (6/9) that showed a normal size uterus that was noted to be "pushed towards the back of the pelvis", a 2.3x1.9x1.9cm complex cystic area within the R ovary,  normal L ovary, and a large amount of complex ascites. Patient is currently awaiting transfer to the GYN service at [facility].  Today, she describes her pain as both sharp and crampy. Feels like the pain is "all over". Pain is most severe in her pelvis and radiates down the top of her legs bilaterally. She also reports generalized pain in her back, abdomen, and shoulders. This pain is distinctly different than her usual endometriosis pain. Notes that her pain has not improved at all since starting antibiotics. Her pain is currently being managed with a dilaudid PCA (0.3mg  q18min w/ 1.25mg  max in 1h), tylenol 650mg  prn, and lidocaine patches. She was previously on toradol, but it needed to be discontinued due to AKI. She reports that the dilaudid helps her a little, but does not control her pain enough to get comfortable or to ambulate/move around. She and her mother are wondering if there are any endometriosis-specific medications to treat her pain right away.   Reports bowel movement yesterday that was loose. Had recently gotten an enema and is on a bowel regimen.   Pertinent OB/GYN History: No LMP recorded (lmp unknown). 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 requested   LAPAROSCOPY N/A 03/19/2020   Procedure: LAPAROSCOPY DIAGNOSTIC WITH ROBOTIC ASSISTANCE AND  BIOPSIES;  Surgeon: Carver Fila, MD;  Location: [facility];  Service: Gynecology;  Laterality: N/A;   ROBOTIC ASSISTED DIAGNOSTIC LAPAROSCOPY N/A 06/18/2015   Procedure:  ROBOTIC ASSISTED DIAGNOSTIC LAPAROSCOPY WITH EXCISION OF PELVIC ENDOMETRIOSIS and lysis of adhesions, chromopertubation;  Surgeon: Maxie Better, MD;  Location: [facility];  Service: Gynecology;  Laterality: N/A;    Family History  Problem Relation Age of Onset   Hypertension Mother    Hypertension Sister    Breast cancer Maternal Aunt    Hypertension Maternal Grandmother    Cervical cancer Maternal Grandmother  Ovarian cancer Neg Hx    Uterine cancer Neg Hx    Colon cancer Neg Hx     Social History:  reports that she quit smoking about 6 years ago. Her smoking use included cigars and cigarettes. She has a 0.80 pack-year smoking history. She has never used smokeless tobacco. She reports current alcohol use. She reports current drug use. Frequency: 35.00 times per week. Drug: Marijuana.  Allergies: No Known Allergies  Medications: I have reviewed the patient's current medications.  Review of Systems: Pertinent items are noted in HPI.  Focused Physical Examination: BP 139/73 (BP Location: Left Arm)   Pulse (!) 115    Temp 98.9 F (37.2 C) (Oral)   Resp 18   Ht 5\' 6"  (1.676 m)   Wt 70.8 kg   LMP  (LMP Unknown)   SpO2 98%   BMI 25.19 kg/m  CONSTITUTIONAL: A&Ox3,  CARDIOVASCULAR: Mild tachycardia RESPIRATORY: Effort normal, no problems with respiration noted. ABDOMEN: Tense, moderately distended, and diffusely tender with voluntary guarding. Chronic draining peri-umbilical wound covered with clean dressing. PELVIC: Deferred MUSCULOSKELETAL: Mild diffuse paraspinal tenderness  Labs and Imaging: Results for orders placed or performed during the hospital encounter of 12/04/22 (from the past 72 hour(s))  Renal function panel     Status: Abnormal   Collection Time: 12/09/22  4:24 AM  Result Value Ref Range   Sodium 133 (L) 135 - 145 mmol/L   Potassium 3.2 (L) 3.5 - 5.1 mmol/L   Chloride 99 98 - 111 mmol/L   CO2 22 22 - 32 mmol/L   Glucose, Bld 101 (H) 70 - 99 mg/dL    Comment: Glucose reference range applies only to samples taken after fasting for at least 8 hours.   BUN 32 (H) 6 - 20 mg/dL   Creatinine, Ser 6.04 (H) 0.44 - 1.00 mg/dL   Calcium 8.1 (L) 8.9 - 10.3 mg/dL   Phosphorus 2.9 2.5 - 4.6 mg/dL   Albumin 2.3 (L) 3.5 - 5.0 g/dL   GFR, Estimated 51 (L) >60 mL/min    Comment: (NOTE) Calculated using the CKD-EPI Creatinine Equation (2021)    Anion gap 12 5 - 15    Comment: Performed at [facility], [address] [place], [place] [address]  Renal function panel     Status: Abnormal   Collection Time: 12/10/22  4:36 AM  Result Value Ref Range   Sodium 133 (L) 135 - 145 mmol/L   Potassium 3.3 (L) 3.5 - 5.1 mmol/L   Chloride 99 98 - 111 mmol/L   CO2 21 (L) 22 - 32 mmol/L   Glucose, Bld 85 70 - 99 mg/dL    Comment: Glucose reference range applies only to samples taken after fasting for at least 8 hours.   BUN 25 (H) 6 - 20 mg/dL   Creatinine, Ser 1.19 (H) 0.44 - 1.00 mg/dL   Calcium 8.1 (L) 8.9 - 10.3 mg/dL   Phosphorus 2.7 2.5 - 4.6 mg/dL   Albumin 2.3 (L) 3.5 -  5.0 g/dL   GFR, Estimated >14 >78 mL/min    Comment: (NOTE) Calculated using the CKD-EPI Creatinine Equation (2021)    Anion gap 13 5 - 15    Comment: Performed at [facility], [address] [place], [place] [address]  CBC     Status: Abnormal   Collection Time: 12/10/22  4:36 AM  Result Value Ref Range   WBC 9.5 4.0 - 10.5 K/uL   RBC 2.77 (L) 3.87 -  5.11 MIL/uL   Hemoglobin 8.5 (L) 12.0 - 15.0 g/dL   HCT 16.1 (L) 09.6 - 04.5 %   MCV 87.0 80.0 - 100.0 fL   MCH 30.7 26.0 - 34.0 pg   MCHC 35.3 30.0 - 36.0 g/dL   RDW 40.9 81.1 - 91.4 %   Platelets 104 (L) 150 - 400 K/uL   nRBC 0.0 0.0 - 0.2 %    Comment: Performed at [facility], [address], [place], [place] [address]  Renal function panel     Status: Abnormal   Collection Time: 12/11/22  2:33 AM  Result Value Ref Range   Sodium 132 (L) 135 - 145 mmol/L   Potassium 3.8 3.5 - 5.1 mmol/L   Chloride 100 98 - 111 mmol/L   CO2 22 22 - 32 mmol/L   Glucose, Bld 83 70 - 99 mg/dL    Comment: Glucose reference range applies only to samples taken after fasting for at least 8 hours.   BUN 19 6 - 20 mg/dL   Creatinine, Ser 6.21 (H) 0.44 - 1.00 mg/dL   Calcium 8.4 (L) 8.9 - 10.3 mg/dL   Phosphorus 3.5 2.5 - 4.6 mg/dL   Albumin 2.4 (L) 3.5 - 5.0 g/dL   GFR, Estimated >30 >86 mL/min    Comment: (NOTE) Calculated using the CKD-EPI Creatinine Equation (2021)    Anion gap 10 5 - 15    Comment: Performed at [facility], [address], [place], [place] [address]  Magnesium     Status: None   Collection Time: 12/11/22  2:33 AM  Result Value Ref Range   Magnesium 1.7 1.7 - 2.4 mg/dL    Comment: Performed at [facility], [address] [place], [place] [address]  CBC     Status: Abnormal   Collection Time: 12/11/22  2:33 AM  Result Value Ref Range   WBC 12.2 (H) 4.0 - 10.5 K/uL   RBC 2.63 (L) 3.87 - 5.11 MIL/uL   Hemoglobin 8.2 (L) 12.0 - 15.0 g/dL   HCT  28.4 (L) 13.2 - 46.0 %   MCV 88.6 80.0 - 100.0 fL   MCH 31.2 26.0 - 34.0 pg   MCHC 35.2 30.0 - 36.0 g/dL   RDW 44.0 (H) 10.2 - 72.5 %   Platelets 151 150 - 400 K/uL   nRBC 0.0 0.0 - 0.2 %    Comment: Performed at [facility], [address], [place], [place] [address]    US PELVIC COMPLETE W TRANSVAGINAL AND TORSION R/O  Result Date: 12/10/2022 CLINICAL DATA:  Endometriosis. Pelvic pain for a week. Chronic ascites with hemorrhage EXAM: TRANSABDOMINAL AND TRANSVAGINAL ULTRASOUND OF PELVIS DOPPLER ULTRASOUND OF OVARIES TECHNIQUE: Both transabdominal and transvaginal ultrasound examinations of the pelvis were performed. Transabdominal technique was performed for global imaging of the pelvis including uterus, ovaries, adnexal regions, and pelvic cul-de-sac. It was necessary to proceed with endovaginal exam following the transabdominal exam to visualize the endometrium and adnexa. Color and duplex Doppler ultrasound was utilized to evaluate blood flow to the ovaries. COMPARISON:  Ultrasound pelvis 08/13/2014. CT 12/03/2022. Older CTs as well. MRI 07/15/2021 FINDINGS: Uterus Measurements: 5.7 x 3.5 x 4.2 cm = volume: 43.6 mL. No fibroids or other mass visualized. Of note the uterus is more pushed towards the back of the pelvis. Endometrium Thickness: Poorly defined with the location of the uterus. What is seen measures 5 mm in thickness. No focal abnormality visualized. Right ovary Measurements:  3.7 x 2.4 x 3.8 cm = volume: 17.8 mL. Blood flow on color Doppler. Small complex cystic areas seen measuring 2.3 x 1.9 x 1.9 cm. This could be a functional cyst Left ovary Measurements: 1.4 x 1.4 x 0.9 cm = volume: 0.9 mL. Normal appearance/no adnexal mass. Pulsed Doppler evaluation of both ovaries demonstrates normal low-resistance arterial and venous waveforms. Other findings Large amount of complex ascites. Critical Value/emergent results were called by telephone at the time of interpretation on  12/10/2022 at 3:58 pm to provider [facility] , who verbally acknowledged these results. IMPRESSION: As seen on prior exam there is a large amount of complex ascites with internal echoes. The uterus is pushed back in the posterior aspect of the pelvis limiting evaluation. Small complex cystic area in the right ovary measuring 2.3 cm. Possible functional cyst. No followup imaging recommended. Note: This recommendation does not apply to premenarchal patients or to those with increased risk (genetic, family history, elevated tumor markers or other high-risk factors) of ovarian cancer. Reference: Radiology 2019 Nov; 293(2):359-371. No separate adnexal masses by ultrasound. Electronically Signed   By: Karen Kays M.D.   On: 12/10/2022 19:03   [place] EKG SITE RITE  Result Date: 12/10/2022 If Site Rite image not attached, placement could not be confirmed due to current cardiac rhythm.  DG Abd 1 View  Result Date: 12/09/2022 CLINICAL DATA:  Ileus EXAM: ABDOMEN - 1 VIEW COMPARISON:  None Available. FINDINGS: The bowel gas pattern is normal. No radio-opaque calculi or other significant radiographic abnormality are seen. IMPRESSION: Negative. Electronically Signed   By: Helyn Numbers M.D.   On: 12/09/2022 23:00

## 2022-12-12 ENCOUNTER — Inpatient Hospital Stay (HOSPITAL_COMMUNITY): Payer: BC Managed Care – PPO

## 2022-12-12 DIAGNOSIS — M7989 Other specified soft tissue disorders: Secondary | ICD-10-CM

## 2022-12-12 LAB — RENAL FUNCTION PANEL
Albumin: 2.2 g/dL — ABNORMAL LOW (ref 3.5–5.0)
Anion gap: 11 (ref 5–15)
BUN: 16 mg/dL (ref 6–20)
CO2: 22 mmol/L (ref 22–32)
Calcium: 8.3 mg/dL — ABNORMAL LOW (ref 8.9–10.3)
Chloride: 102 mmol/L (ref 98–111)
Creatinine, Ser: 1.06 mg/dL — ABNORMAL HIGH (ref 0.44–1.00)
GFR, Estimated: >60 mL/min (ref >60)
Glucose, Bld: 84 mg/dL (ref 70–99)
Phosphorus: 4.8 mg/dL — ABNORMAL HIGH (ref 2.5–4.6)
Potassium: 3.9 mmol/L (ref 3.5–5.1)
Sodium: 135 mmol/L (ref 135–145)

## 2022-12-12 MED ORDER — CYCLOBENZAPRINE HCL 5 MG PO TABS
5.0000 mg | ORAL_TABLET | Freq: Three times a day (TID) | ORAL | Status: DC
  Administered 2022-12-12 (×2): 5 mg via ORAL
  Filled 2022-12-12 (×2): qty 1

## 2022-12-12 MED ORDER — ADULT MULTIVITAMIN W/MINERALS CH: 1.0000 | ORAL_TABLET | Freq: Every day | ORAL | Status: DC

## 2022-12-12 MED ORDER — NORETHINDRONE ACETATE 5 MG PO TABS
10.0000 mg | ORAL_TABLET | Freq: Once | ORAL | Status: AC
  Administered 2022-12-12: 10 mg via ORAL
  Filled 2022-12-12: qty 2

## 2022-12-12 MED ORDER — ENSURE ENLIVE PO LIQD: 237.0000 mL | Freq: Three times a day (TID) | ORAL | Status: DC

## 2022-12-12 MED ORDER — DIAZEPAM 5 MG PO TABS
10.0000 mg | ORAL_TABLET | Freq: Three times a day (TID) | ORAL | Status: DC | PRN
  Administered 2022-12-12: 10 mg via ORAL
  Filled 2022-12-12: qty 2

## 2022-12-12 MED ORDER — NORETHINDRONE ACETATE 5 MG PO TABS: 15.0000 mg | ORAL_TABLET | Freq: Every day | ORAL | Status: DC

## 2022-12-12 NOTE — Discharge Summary (Signed)
Physician Discharge Summary   Patient: Angelica Blankenship MRN: [id number] DOB: [birth year]  Admit date:     12/04/2022  Discharge date: 12/12/22  Discharge Physician: Meredeth Ide   PCP: Marcine Matar, MD   Recommendations at discharge:   Patient transferred to [facility] at [facility]  Discharge Diagnoses: Principal Problem:   Spontaneous bacterial peritonitis [facility]) Active Problems:   Endometriosis   Essential hypertension   Normocytic anemia   Hypokalemia   Hypomagnesemia  Resolved Problems:   * No resolved hospital problems. *  Hospital Course:  32 year old female with history of esophagus, endometriosis, adnexal mass, recurrent ascites with last paracentesis in February this year, asymptomatic heart murmur, hypertension, pulm nodule, normocytic anemia came to hospital for abdominal pain.  4 L of fluid was removed in the ER via paracentesis.  Culture grew MRSA, ID was consulted.   Assessment and Plan:  Spontaneous bacterial peritonitis - ascites from endometriosis -Culture grew MRSA, started on vancomycin -ID consulted; antibiotics were changed daptomycin -Patient developed thrombocytopenia on 12/10/2022.  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 and also has multiple/complaints, rings placed several months prior which could be site of injury, bacterial translocation to blood -CT abdomen/pelvis unremarkable, no abscess at umbilicus -Ascitic fluid showed few MRSA sensitive to penicillin, 97% neutrophils; blood culture showed no growth -TTE is unremarkable -PICC line placed   Bilateral lower extremity edema -Venous duplex of lower EXTR which is negative for DVT     Abdominal pain/stage IV endometriosis -Abdominal exam is benign -Secondary to SBP and endometriosis -CT abdomen/pelvis obtained on 12/03/2022 was  unremarkable -Pelvic ultrasound obtained on 12/10/2022 was unremarkable -Continue Dilaudid PCA -Patient still had uncontrolled pain despite being on Dilaudid PCA,  -OBG/GYN saw patient yesterday, started vaginal Valium, also started on Flexeril 5 mg p.o. 3 times daily as needed, gabapentin 100 mg p.o. 3 times daily -Patient pain improved with Valium, will start vaginal Valium 10 mg 3 times daily as needed -Started on lidocaine patch- 3 patches per day   Acute kidney injury -Creatinine 1.91; currently improving with IV fluids -Today creatinine is 1.06 -Likely from intravascular volume depletion; after paracentesis, emesis -UA shows proteinuria, hematuria -IV fluids changed to isotonic bicarb as per nephrology -Discussed with nephrology, likely multifactorial from intravascular volume depletion from paracentesis, emesis, Toradol, vancomycin -IV fluids were discontinued   Constipation -got enema this morning with good effect -Started on Senokot-S tablet 1 tablet p.o. twice daily     History of endometriosis No need for GYN at the moment per GYN on-call. (Dr. Duane Lope)- will reach out to doc at [facility] -restarted hormonal treatment -Discussed with GYN on-call Dr. Connye Burkitt, GYN  -Patient was seen by OB/GYN in the hospital -She will be transferred to  [facility]  Essential hypertension -Currently on metoprolol 25 mg daily, started in the ED   Sinus tachycardia -Patient having sinus tachycardia during this hospital admission -Secondary to fluid shift after paracentesis and intravascular depletion, uncontrolled pain -Has been getting IV fluids in the hospital --Examined discontinued -She is also on metoprolol for hypertension as above -Continue to monitor on telemetry   Normocytic anemia -Hemoglobin dropped to 8.3 as of 12/07/2022 -Likely dilutional effect, no evidence of black stool or bleeding Transfuse as needed. -Follow CBC in a.m.   Hypokalemia -Potassium is  3.8 -Replace potassium and follow BMP in am  Hypomagnesemia/hypophosphatemia  -Replete   Thrombocytopenia - Platelet count has dropped  to 104,000 this morning -?  Drug-induced from daptomycin -It was 282 on 12/07/2022; the daptomycin was started -Discussed with  ID Dr Ilsa Iha, will switch from Daptomycin to Vancomycin. -Blood count has improved to 151 -Daptomycin has been discontinued     Family expressed wish to be transferred to [facility].  Awaiting bed at the facility.  She has been accepted for transfer by Dr. Lum Keas under gynecological service        Consultants: Gynecology, infectious disease, nephrology Procedures performed: Paracentesis Disposition: Transferred to [facility] Diet recommendation:  Regular diet DISCHARGE MEDICATION: Allergies as of 12/12/2022   No Known Allergies      Medication List     STOP taking these medications    ibuprofen 800 MG tablet Commonly known as: ADVIL       TAKE these medications    acetaminophen 500 MG tablet Commonly known as: TYLENOL Take 1,000 mg by mouth every 6 (six) hours as needed for moderate pain.   aspirin-acetaminophen-caffeine 250-250-65 MG tablet Commonly known as: EXCEDRIN MIGRAINE Take 1 tablet by mouth every 6 (six) hours as needed for headache.   HYDROcodone-acetaminophen 5-325 MG tablet Commonly known as: NORCO/VICODIN Take 2 tablets by mouth every 4 (four) hours as needed.   metroNIDAZOLE 0.75 % vaginal gel Commonly known as: METROGEL Place 1 Applicatorful vaginally at bedtime.   multivitamin with minerals Tabs tablet Take 1 tablet by mouth daily.   senna-docusate 8.6-50 MG tablet Commonly known as: Senokot-S Take 2 tablets by mouth at bedtime. For AFTER surgery, do not take if having diarrhea        Discharge Exam: Filed Weights   12/04/22 1700 12/10/22 0500  Weight: 67.2 kg 70.8 kg   General-appears in no acute distress Heart-S1-S2, regular, no murmur  auscultated Lungs-clear to auscultation bilaterally, no wheezing or crackles auscultated Abdomen-soft, mild tenderness to palpation  no organomegaly Extremities-no edema in the lower extremities Neuro-alert, oriented x3, no focal deficit noted  Condition at discharge: good  The results of significant diagnostics from this hospitalization (including imaging, microbiology, ancillary and laboratory) are listed below for reference.   Imaging Studies: VAS [place] LOWER EXTREMITY VENOUS (DVT)  Result Date: 12/12/2022  Lower Venous DVT Study Patient Name:  JAYDALIZ BENTE  Date of Exam:   12/12/2022 Medical Rec #: [id number]         Accession #:    [id number] Date of Birth: 07/26/1990          Patient Gender: F Patient Age:   32 years Exam Location:  [facility] Procedure:      VAS [place] LOWER EXTREMITY VENOUS (DVT) Referring Phys: Sibyl Parr Mickey Hebel --------------------------------------------------------------------------------  Indications: Swelling.  Risk Factors: None identified. Limitations: Poor ultrasound/tissue interface. Comparison Study: No prior studies. Performing Technologist: Chanda Busing RVT  Examination Guidelines: A complete evaluation includes B-mode imaging, spectral Doppler, color Doppler, and power Doppler as needed of all accessible portions of each vessel. Bilateral testing is considered an integral part of a complete examination. Limited examinations for reoccurring indications may be performed as noted. The reflux portion of the exam is performed with the patient in reverse Trendelenburg.  +---------+---------------+---------+-----------+----------+--------------+ RIGHT    CompressibilityPhasicitySpontaneityPropertiesThrombus Aging +---------+---------------+---------+-----------+----------+--------------+ CFV      Full           Yes      Yes                                 +---------+---------------+---------+-----------+----------+--------------+  SFJ      Full                                                         +---------+---------------+---------+-----------+----------+--------------+ FV Prox  Full                                                        +---------+---------------+---------+-----------+----------+--------------+ FV Mid   Full                                                        +---------+---------------+---------+-----------+----------+--------------+ FV DistalFull                                                        +---------+---------------+---------+-----------+----------+--------------+ PFV      Full                                                        +---------+---------------+---------+-----------+----------+--------------+ POP      Full           Yes      Yes                                 +---------+---------------+---------+-----------+----------+--------------+ PTV      Full                                                        +---------+---------------+---------+-----------+----------+--------------+ PERO     Full                                                        +---------+---------------+---------+-----------+----------+--------------+   +---------+---------------+---------+-----------+----------+--------------+ LEFT     CompressibilityPhasicitySpontaneityPropertiesThrombus Aging +---------+---------------+---------+-----------+----------+--------------+ CFV      Full           Yes      Yes                                 +---------+---------------+---------+-----------+----------+--------------+ SFJ      Full                                                        +---------+---------------+---------+-----------+----------+--------------+  FV Prox  Full                                                        +---------+---------------+---------+-----------+----------+--------------+ FV Mid   Full                                                         +---------+---------------+---------+-----------+----------+--------------+ FV DistalFull                                                        +---------+---------------+---------+-----------+----------+--------------+ PFV      Full                                                        +---------+---------------+---------+-----------+----------+--------------+ POP      Full           Yes      Yes                                 +---------+---------------+---------+-----------+----------+--------------+ PTV      Full                                                        +---------+---------------+---------+-----------+----------+--------------+ PERO     Full                                                        +---------+---------------+---------+-----------+----------+--------------+     Summary: RIGHT: - There is no evidence of deep vein thrombosis in the lower extremity.  - No cystic structure found in the popliteal fossa.  LEFT: - There is no evidence of deep vein thrombosis in the lower extremity.  - No cystic structure found in the popliteal fossa.  *See table(s) above for measurements and observations. Electronically signed by Lemar Livings MD on 12/12/2022 at 2:31:31 PM.    Final    US PELVIC COMPLETE W TRANSVAGINAL AND TORSION R/O  Result Date: 12/10/2022 CLINICAL DATA:  Endometriosis. Pelvic pain for a week. Chronic ascites with hemorrhage EXAM: TRANSABDOMINAL AND TRANSVAGINAL ULTRASOUND OF PELVIS DOPPLER ULTRASOUND OF OVARIES TECHNIQUE: Both transabdominal and transvaginal ultrasound examinations of the pelvis were performed. Transabdominal technique was performed for global imaging of the pelvis including uterus, ovaries, adnexal regions, and pelvic cul-de-sac. It was necessary to proceed with endovaginal exam following the transabdominal exam to visualize the endometrium and adnexa. Color and duplex Doppler ultrasound was utilized to evaluate blood flow  to the  ovaries. COMPARISON:  Ultrasound pelvis 08/13/2014. CT 12/03/2022. Older CTs as well. MRI 07/15/2021 FINDINGS: Uterus Measurements: 5.7 x 3.5 x 4.2 cm = volume: 43.6 mL. No fibroids or other mass visualized. Of note the uterus is more pushed towards the back of the pelvis. Endometrium Thickness: Poorly defined with the location of the uterus. What is seen measures 5 mm in thickness. No focal abnormality visualized. Right ovary Measurements: 3.7 x 2.4 x 3.8 cm = volume: 17.8 mL. Blood flow on color Doppler. Small complex cystic areas seen measuring 2.3 x 1.9 x 1.9 cm. This could be a functional cyst Left ovary Measurements: 1.4 x 1.4 x 0.9 cm = volume: 0.9 mL. Normal appearance/no adnexal mass. Pulsed Doppler evaluation of both ovaries demonstrates normal low-resistance arterial and venous waveforms. Other findings Large amount of complex ascites. Critical Value/emergent results were called by telephone at the time of interpretation on 12/10/2022 at 3:58 pm to provider [facility] Almeda Ezra , who verbally acknowledged these results. IMPRESSION: As seen on prior exam there is a large amount of complex ascites with internal echoes. The uterus is pushed back in the posterior aspect of the pelvis limiting evaluation. Small complex cystic area in the right ovary measuring 2.3 cm. Possible functional cyst. No followup imaging recommended. Note: This recommendation does not apply to premenarchal patients or to those with increased risk (genetic, family history, elevated tumor markers or other high-risk factors) of ovarian cancer. Reference: Radiology 2019 Nov; 293(2):359-371. No separate adnexal masses by ultrasound. Electronically Signed   By: Karen Kays M.D.   On: 12/10/2022 19:03   [place] EKG SITE RITE  Result Date: 12/10/2022 If Site Rite image not attached, placement could not be confirmed due to current cardiac rhythm.  DG Abd 1 View  Result Date: 12/09/2022 CLINICAL DATA:  Ileus EXAM: ABDOMEN - 1 VIEW COMPARISON:  None  Available. FINDINGS: The bowel gas pattern is normal. No radio-opaque calculi or other significant radiographic abnormality are seen. IMPRESSION: Negative. Electronically Signed   By: Helyn Numbers M.D.   On: 12/09/2022 23:00   [place] EKG SITE RITE  Result Date: 12/09/2022 If Site Rite image not attached, placement could not be confirmed due to current cardiac rhythm.  ECHOCARDIOGRAM COMPLETE  Result Date: 12/06/2022    ECHOCARDIOGRAM REPORT   Patient Name:   JIANNI MEZGER Date of Exam: 12/06/2022 Medical Rec #:  [id number]        Height:       66.0 in Accession #:    [id number]       Weight:       148.1 lb Date of Birth:  May 23, 1991         BSA:          1.760 m Patient Age:    32 years         BP:           116/84 mmHg Patient Gender: F                HR:           114 bpm. Exam Location:  Inpatient Procedure: 2D Echo, Color Doppler and Cardiac Doppler Indications:    Bacteremia  History:        Patient has prior history of Echocardiogram examinations, most                 recent 02/09/2020. Arrythmias:Tachycardia; Risk  Factors:Hypertension.  Sonographer:    Lucy Antigua Referring Phys: [phone] JESSICA U VANN IMPRESSIONS  1. Left ventricular ejection fraction, by estimation, is 55 to 60%. The left ventricle has normal function. The left ventricle has no regional wall motion abnormalities. Left ventricular diastolic parameters were normal.  2. Right ventricular systolic function is normal. The right ventricular size is normal.  3. The mitral valve is normal in structure. No evidence of mitral valve regurgitation. No evidence of mitral stenosis.  4. The aortic valve is normal in structure. Aortic valve regurgitation is not visualized. No aortic stenosis is present.  5. The inferior vena cava is normal in size with greater than 50% respiratory variability, suggesting right atrial pressure of 3 mmHg. FINDINGS  Left Ventricle: Left ventricular ejection fraction, by estimation, is 55 to 60%. The left  ventricle has normal function. The left ventricle has no regional wall motion abnormalities. The left ventricular internal cavity size was normal in size. There is  no left ventricular hypertrophy. Left ventricular diastolic parameters were normal. Right Ventricle: The right ventricular size is normal. No increase in right ventricular wall thickness. Right ventricular systolic function is normal. Left [facility]: Left atrial size was normal in size. Right [facility]: Right atrial size was normal in size. Pericardium: There is no evidence of pericardial effusion. Mitral Valve: The mitral valve is normal in structure. There is mild thickening of the anterior mitral valve leaflet(s). No evidence of mitral valve regurgitation. No evidence of mitral valve stenosis. Tricuspid Valve: The tricuspid valve is normal in structure. Tricuspid valve regurgitation is mild . No evidence of tricuspid stenosis. Aortic Valve: The aortic valve is normal in structure. Aortic valve regurgitation is not visualized. No aortic stenosis is present. Aortic valve mean gradient measures 6.0 mmHg. Aortic valve peak gradient measures 10.5 mmHg. Aortic valve area, by VTI measures 2.75 cm. Pulmonic Valve: The pulmonic valve was normal in structure. Pulmonic valve regurgitation is not visualized. No evidence of pulmonic stenosis. Aorta: The aortic root is normal in size and structure. Venous: The inferior vena cava is normal in size with greater than 50% respiratory variability, suggesting right atrial pressure of 3 mmHg. IAS/Shunts: No atrial level shunt detected by color flow Doppler.  LEFT VENTRICLE PLAX 2D LVIDd:         4.60 cm   Diastology LVIDs:         3.15 cm   LV e' medial:    6.74 cm/s LV PW:         1.05 cm   LV E/e' medial:  16.9 LV IVS:        0.85 cm   LV e' lateral:   15.20 cm/s LVOT diam:     2.10 cm   LV E/e' lateral: 7.5 LV SV:         76 LV SV Index:   43 LVOT Area:     3.46 cm  RIGHT VENTRICLE             IVC RV S prime:     13.90  cm/s  IVC diam: 2.00 cm TAPSE (M-mode): 1.3 cm LEFT ATRIUM             Index        RIGHT ATRIUM           Index LA Vol (A2C):   30.9 ml 17.55 ml/m  RA Area:     16.10 cm LA Vol (A4C):   33.7 ml 19.14 ml/m  RA Volume:  42.70 ml  24.26 ml/m LA Biplane Vol: 34.1 ml 19.37 ml/m  AORTIC VALVE AV Area (Vmax):    2.99 cm AV Area (Vmean):   2.56 cm AV Area (VTI):     2.75 cm AV Vmax:           162.00 cm/s AV Vmean:          111.000 cm/s AV VTI:            0.277 m AV Peak Grad:      10.5 mmHg AV Mean Grad:      6.0 mmHg LVOT Vmax:         140.00 cm/s LVOT Vmean:        81.900 cm/s LVOT VTI:          0.220 m LVOT/AV VTI ratio: 0.79  AORTA Ao Root diam: 2.80 cm Ao Asc diam:  2.60 cm MITRAL VALVE                TRICUSPID VALVE MV Area (PHT): 5.27 cm     TR Peak grad:   21.7 mmHg MV Decel Time: 144 msec     TR Vmax:        233.00 cm/s MV E velocity: 114.00 cm/s MV A velocity: 63.70 cm/s   SHUNTS MV E/A ratio:  1.79         Systemic VTI:  0.22 m                             Systemic Diam: 2.10 cm Aditya Sabharwal Electronically signed by Dorthula Nettles Signature Date/Time: 12/06/2022/2:33:51 PM    Final    US Paracentesis  Result Date: 12/06/2022 INDICATION: Patient with history of endometriosis, abdominal pain, recurrent ascites, spontaneous bacterial peritonitis; request received for diagnostic and therapeutic paracentesis EXAM: ULTRASOUND GUIDED DIAGNOSTIC AND THERAPEUTIC PARACENTESIS MEDICATIONS: 8 mL 1% lidocaine COMPLICATIONS: None immediate. PROCEDURE: Informed written consent was obtained from the patient after a discussion of the risks, benefits and alternatives to treatment. A timeout was performed prior to the initiation of the procedure. Initial ultrasound scanning demonstrates a moderate amount of ascites within the right upper to mid abdominal quadrant. The right upper to mid abdomen was prepped and draped in the usual sterile fashion. 1% lidocaine was used for local anesthesia. Following this, a 19  gauge, 10-cm, Yueh catheter was introduced. An ultrasound image was saved for documentation purposes. The paracentesis was performed. The catheter was removed and a dressing was applied. The patient tolerated the procedure well without immediate post procedural complication. FINDINGS: A total of approximately 2.3 liters of dark brown fluid was removed. Samples were sent to the laboratory as requested by the clinical team. The ascitic fluid was multiseptated. IMPRESSION: Successful ultrasound-guided diagnostic and therapeutic paracentesis yielding 2.3 liters of peritoneal fluid. Performed by: Artemio Aly Electronically Signed   By: Acquanetta Belling M.D.   On: 12/06/2022 13:35   CT ABDOMEN PELVIS W CONTRAST  Result Date: 12/03/2022 CLINICAL DATA:  Abdominal pain. History of endometriosis and recurrent ascites EXAM: CT ABDOMEN AND PELVIS WITH CONTRAST TECHNIQUE: Multidetector CT imaging of the abdomen and pelvis was performed using the standard protocol following bolus administration of intravenous contrast. RADIATION DOSE REDUCTION: This exam was performed according to the departmental dose-optimization program which includes automated exposure control, adjustment of the mA and/or kV according to patient size and/or use of iterative reconstruction technique. CONTRAST:  OMNIPAQUE IOHEXOL 300 MG/ML  SOLN COMPARISON:  02/28/2020, 07/15/2021 FINDINGS: Lower chest: Linear bibasilar atelectasis.  Heart size is normal. Hepatobiliary: No focal liver abnormality is seen. No gallstones, gallbladder wall thickening, or biliary dilatation. Pancreas: Unremarkable. No pancreatic ductal dilatation or surrounding inflammatory changes. Spleen: Normal in size without focal abnormality. Adrenals/Urinary Tract: Unremarkable adrenal glands. Kidneys enhance symmetrically without focal lesion, stone, or hydronephrosis. Ureters are nondilated. Urinary bladder is decompressed. Stomach/Bowel: Stomach is within normal limits.  Appendix not definitively seen. No evidence of bowel wall thickening, distention, or inflammatory changes. Vascular/Lymphatic: No significant vascular findings are present. No enlarged abdominal or pelvic lymph nodes. Reproductive: Unremarkable uterus. Evaluation of the adnexal regions is limited given the degree of surrounding ascites. Other: Large volume ascites throughout the abdomen measuring slightly greater than simple fluid. No free air. No abdominal wall hernia. Musculoskeletal: Soft tissue thickening in the region of the umbilicus is unchanged from prior and may be related to prior laparoscopy changes. No acute or significant osseous findings. Mild scoliotic thoracolumbar curvature. IMPRESSION: 1. Recurrent large volume ascites throughout the abdomen measuring slightly greater than simple fluid. Findings are nonspecific and may be related to patient's history of endometriosis. 2. Otherwise, no acute abdominopelvic findings. Electronically Signed   By: Duanne Guess D.O.   On: 12/03/2022 13:53    Microbiology: Results for orders placed or performed during the hospital encounter of 12/04/22  Body fluid culture w Gram Stain     Status: None (Preliminary result)   Collection Time: 12/04/22 10:09 AM   Specimen: Peritoneal Washings; Body Fluid  Result Value Ref Range Status   Specimen Description   Final    PERITONEAL Performed at [facility], [address], [place], [place] [address]    Special Requests   Final    NONE Performed at [facility], [address], [place], [place] [address]    Gram Stain   Final    FEW WBC PRESENT, PREDOMINANTLY PMN RED BLOOD CELLS PRESENT NO ORGANISMS SEEN    Culture   Final    FEW METHICILLIN RESISTANT STAPHYLOCOCCUS AUREUS CRITICAL RESULT CALLED TO, READ BACK BY AND VERIFIED WITH: D. DARK RN, AT 1054 12/06/22 D. VANHOOK REGARDING CULTURE GROWTH Sent to [facility] for further susceptibility testing. Performed at [facility], [address], [place], [place] [address]    Report Status PENDING  Incomplete   Organism ID, Bacteria METHICILLIN RESISTANT STAPHYLOCOCCUS AUREUS  Final      Susceptibility   Methicillin resistant staphylococcus aureus - MIC*    CIPROFLOXACIN >=8 RESISTANT Resistant     ERYTHROMYCIN >=8 RESISTANT Resistant     GENTAMICIN <=0.5 SENSITIVE Sensitive     OXACILLIN >=4 RESISTANT Resistant     TETRACYCLINE >=16 RESISTANT Resistant     VANCOMYCIN <=0.5 SENSITIVE Sensitive     TRIMETH/SULFA >=320 RESISTANT Resistant     CLINDAMYCIN <=0.25 SENSITIVE Sensitive     RIFAMPIN <=0.5 SENSITIVE Sensitive     Inducible Clindamycin NEGATIVE Sensitive     LINEZOLID 2 SENSITIVE Sensitive     * FEW METHICILLIN RESISTANT STAPHYLOCOCCUS AUREUS  Minimum Inhibitory Conc. (1 Drug)     Status: None   Collection Time: 12/04/22 10:09 AM  Result Value Ref Range Status   Min Inhibitory Conc (1 Drug) Final report  Corrected    Comment: (NOTE) Performed At: [facility] [address] [place], [place] [id number] Jolene Schimke MD IO:[id number] CORRECTED ON 06/08 AT 2440: PREVIOUSLY REPORTED AS Preliminary report    Source CRE PENDING  Incomplete  Culture, blood (Routine X 2) w Reflex to ID Panel     Status: None   Collection Time: 12/06/22 11:31 AM   Specimen: BLOOD RIGHT HAND  Result Value Ref Range Status   Specimen Description   Final    BLOOD RIGHT HAND Performed at [facility], [address], [place], [place] [address]    Special Requests   Final    AEROBIC BOTTLE ONLY Blood Culture adequate volume Performed at [facility], [address] [place], [place] [address]    Culture   Final    NO GROWTH 5 DAYS Performed at [facility], [address] [place], [place] [address]    Report Status 12/11/2022 FINAL  Final  Culture, blood (Routine X 2) w Reflex to ID Panel     Status: None   Collection Time: 12/06/22 11:31 AM    Specimen: Left Antecubital; Blood  Result Value Ref Range Status   Specimen Description   Final    LEFT ANTECUBITAL BLOOD Performed at [facility], [address], [place], [place] [address]    Special Requests   Final    AEROBIC BOTTLE ONLY Blood Culture adequate volume Performed at [facility], [address] [place], [place] [address]    Culture   Final    NO GROWTH 5 DAYS Performed at [facility], [address] [place], [place] [address]    Report Status 12/11/2022 FINAL  Final  Body fluid culture w Gram Stain     Status: None   Collection Time: 12/06/22 12:16 PM   Specimen: PATH Cytology Peritoneal fluid  Result Value Ref Range Status   Specimen Description   Final    PERITONEAL Performed at [facility], [address], [place], [place] [address]    Special Requests   Final    NONE Performed at [facility], [address] [place], [place] [address]    Gram Stain   Final    RARE WBC PRESENT, PREDOMINANTLY MONONUCLEAR RARE GRAM POSITIVE COCCI IN PAIRS CRITICAL RESULT CALLED TO, READ BACK BY AND VERIFIED WITH: RN DDot Been [phone] @ [phone] FH Performed at [facility], [address] [place], [place] [address]    Culture FEW METHICILLIN RESISTANT STAPHYLOCOCCUS AUREUS  Final   Report Status 12/08/2022 FINAL  Final   Organism ID, Bacteria METHICILLIN RESISTANT STAPHYLOCOCCUS AUREUS  Final      Susceptibility   Methicillin resistant staphylococcus aureus - MIC*    CIPROFLOXACIN >=8 RESISTANT Resistant     ERYTHROMYCIN >=8 RESISTANT Resistant     GENTAMICIN <=0.5 SENSITIVE Sensitive     OXACILLIN >=4 RESISTANT Resistant     TETRACYCLINE >=16 RESISTANT Resistant     VANCOMYCIN 1 SENSITIVE Sensitive     TRIMETH/SULFA >=320 RESISTANT Resistant     CLINDAMYCIN <=0.25 SENSITIVE Sensitive     RIFAMPIN <=0.5 SENSITIVE Sensitive     Inducible Clindamycin NEGATIVE Sensitive     LINEZOLID 2 SENSITIVE  Sensitive     * FEW METHICILLIN RESISTANT STAPHYLOCOCCUS AUREUS    Labs: CBC: Recent Labs  Lab 12/06/22 0841 12/07/22 0358 12/10/22 0436 12/11/22 0233  WBC 3.1* 4.3 9.5 12.2*  HGB 10.6* 8.3* 8.5* 8.2*  HCT 31.9* 25.8* 24.1* 23.3*  MCV 94.1 95.2 87.0 88.6  PLT 258 282 104* 151   Basic Metabolic Panel: Recent Labs  Lab 12/08/22 0438 12/09/22 0424 12/10/22 0436 12/11/22 0233 12/12/22 0304  NA 130* 133* 133* 132* 135  K 3.6 3.2* 3.3* 3.8 3.9  CL 106 99 99 100 102  CO2 15* 22 21* 22 22  GLUCOSE 84 101* 85 83 84  BUN 38* 32* 25* 19 16  CREATININE 1.47* 1.39* 1.17* 1.09* 1.06*  CALCIUM 7.9* 8.1* 8.1* 8.4* 8.3*  MG  --   --   --  1.7  --   PHOS 3.1 2.9 2.7 3.5 4.8*   Liver Function Tests: Recent Labs  Lab 12/08/22 0438 12/09/22 0424 12/10/22 0436 12/11/22 0233 12/12/22 0304  ALBUMIN 2.2* 2.3* 2.3* 2.4* 2.2*   CBG: No results for input(s): "GLUCAP" in the last 168 hours.  Discharge time spent: greater than 30 minutes.  Signed: Meredeth Ide, MD [facility] 12/12/2022

## 2022-12-12 NOTE — Progress Notes (Signed)
Initial Nutrition Assessment  DOCUMENTATION CODES:   Not applicable  INTERVENTION:  - Regular diet.  - Ensure Plus High Protein po TID, each supplement provides 350 kcal and 20 grams of protein. - Multivitamin with minerals daily - Monitor weight trends.   NUTRITION DIAGNOSIS:   Inadequate oral intake related to other (see comment) (abdominal pain) as evidenced by meal completion < 25%.  GOAL:   Patient will meet greater than or equal to 90% of their needs  MONITOR:   PO intake, Supplement acceptance, Weight trends  REASON FOR ASSESSMENT:   Consult Assessment of nutrition requirement/status  ASSESSMENT:   32 year old female with history of endometriosis, adnexal mass, recurrent ascites (last paracentesis in February 2024), HTN, pulm nodule, normocytic anemia who presented for abdominal pain.   4 L of fluid was removed in the ER via paracentesis. Culture grew MRSA. Admitted for spontaneous bacterial peritonitis.  Patient working with other providers at time of visit today. PT note this afternoon indicates RN reported patient to be minimally responsive.   Per chart review, weight history within the past year as below: 04/30/22: 157# 10/17/22: 155# 12/03/22: 154# 12/04/22: 148# - weight taken after paracentesis, likely most accurate 12/10/22:156#  Patient documented to have had 25% of lunch on 6/7 and 0% of breakfast and lunch today. Nothing documented inbetween these dates. Will order nutrition supplements to support intake.   Medications reviewed and include: Colace, Senokot, Zofran  Labs reviewed:  Creatinine 1.06 Phosphorus 4.8   NUTRITION - FOCUSED PHYSICAL EXAM:  Unable to perform at this time  Diet Order:   Diet Order             Diet regular Room service appropriate? Yes; Fluid consistency: Thin  Diet effective now                   EDUCATION NEEDS:  Not appropriate for education at this time  Skin:  Skin Assessment: Skin Integrity  Issues: Skin Integrity Issues:: Incisions Incisions: Open Umbilicus  Last BM:  6/8  Height:  Ht Readings from Last 1 Encounters:  12/04/22 5\' 6"  (1.676 m)   Weight:  Wt Readings from Last 1 Encounters:  12/10/22 70.8 kg  12/04/22 67.2 kg  *Suspect initial weight 6/2 is more accurate as this was taken after patient had paracentesis  BMI:  Body mass index is 25.19 kg/m.  Estimated Nutritional Needs:  Kcal:  1900-2150 kcals Protein:  80-95 grams Fluid:  >/= 1.9L    Shelle Iron RD, LDN For contact information, refer to [facility].

## 2022-12-12 NOTE — Progress Notes (Addendum)
[facility]  PROGRESS NOTE  Angelica Blankenship JXB:[id number] DOB: 14-Jul-1990 DOA: 12/04/2022 PCP: Marcine Matar, MD   Brief HPI:   32 year old female with history of esophagus, endometriosis, adnexal mass, recurrent ascites with last paracentesis in February this year, asymptomatic heart murmur, hypertension, pulm nodule, normocytic anemia came to hospital for abdominal pain.  4 L of fluid was removed in the ER via paracentesis.  Culture grew MRSA, ID was consulted.    Assessment/Plan:   Spontaneous bacterial peritonitis - ascites from endometriosis -Culture grew MRSA, started on vancomycin -ID consulted; antibiotics were changed daptomycin -Patient developed thrombocytopenia on 12/10/2022.  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 and also has multiple/complaints, rings placed several months prior which could be site of injury, bacterial translocation to blood -CT abdomen/pelvis unremarkable, no abscess at umbilicus -Ascitic fluid showed few MRSA sensitive to penicillin, 97% neutrophils; blood culture pending -TTE is unremarkable -PICC line placed  Bilateral lower extremity edema -Venous duplex of lower EXTR which is negative for DVT   Abdominal pain/stage IV endometriosis -Abdominal exam is benign -Secondary to SBP and endometriosis -CT abdomen/pelvis obtained on 12/03/2022 was unremarkable -Pelvic ultrasound obtained on 12/10/2022 was unremarkable -Continue Dilaudid PCA -Patient still has uncontrolled pain despite being on Dilaudid PCA,  -OBG/GYN saw patient yesterday, started vaginal Valium, also started on Flexeril 5 mg p.o. 3 times daily as needed, gabapentin 100 mg p.o. 3 times daily -Patient pain improved with Valium, will start vaginal Valium 10 mg 3 times daily as needed -Started on lidocaine patch- 3 patches per day  Acute  kidney injury -Creatinine 1.91; currently improving with IV fluids -Today creatinine is 1.06 -Likely from intravascular volume depletion; after paracentesis, emesis -UA shows proteinuria, hematuria -IV fluids changed to isotonic bicarb as per nephrology -Discussed with nephrology, likely multifactorial from intravascular volume depletion from paracentesis, emesis, Toradol, vancomycin  Constipation -got enema this morning with good effect -Started on Senokot-S tablet 1 tablet p.o. twice daily   History of endometriosis No need for GYN at the moment per GYN on-call. (Dr. Duane Lope)- will reach out to doc at [facility] -restarted hormonal treatment -Discussed with GYN on-call Dr. Connye Burkitt, GYN  -Patient was seen by OB/GYN in the hospital -Currently awaiting transfer to [facility] -   Essential hypertension -Currently on metoprolol 25 mg daily, started in the ED  Sinus tachycardia -Patient having sinus tachycardia during this hospital admission -Secondary to fluid shift after paracentesis and intravascular depletion, uncontrolled pain -Has been getting IV fluids in the hospital --Examined discontinued -She is also on metoprolol for hypertension as above -Continue to monitor on telemetry   Normocytic anemia -Hemoglobin dropped to 8.3 as of 12/07/2022 -Likely dilutional effect, no evidence of black stool or bleeding Transfuse as needed. -Follow CBC in a.m.   Hypokalemia -Potassium is 3.8 -Replace potassium and follow BMP in am   Hypomagnesemia/hypophosphatemia  -Replete  Thrombocytopenia - Platelet count has dropped  to 104,000 this morning -?  Drug-induced from daptomycin -It was 282 on 12/07/2022; the daptomycin was started -Discussed with  ID Dr Ilsa Iha, will switch from Daptomycin to Vancomycin. -Blood count has improved to 151 -Daptomycin has been discontinued   Family expressed wish to be transferred to [facility].  Awaiting bed at the facility.  She has been  accepted for transfer by Dr. Lum Keas under gynecological service  Medications  Chlorhexidine Gluconate Cloth  6 each Topical Daily   docusate sodium  100 mg Oral BID   gabapentin  100 mg Oral TID   HYDROmorphone   Intravenous Q4H   lidocaine  1 patch Transdermal Daily   lidocaine  2 patch Transdermal Daily   metoprolol succinate  25 mg Oral Daily   norethindrone  5 mg Oral BID   senna-docusate  1 tablet Oral BID   sodium phosphate  1 enema Rectal Once     Data Reviewed:   CBG:  No results for input(s): "GLUCAP" in the last 168 hours.  SpO2: 98 % O2 Flow Rate (L/min): 0 L/min FiO2 (%): 28 %    Vitals:   12/12/22 0004 12/12/22 0400 12/12/22 0618 12/12/22 0829  BP:   133/88   Pulse:   (!) 105   Resp: (!) 22 (!) 22 20 20   Temp:   98.7 F (37.1 C)   TempSrc:   Oral   SpO2: 97% 99% 100% 98%  Weight:      Height:          Data Reviewed:  Basic Metabolic Panel: Recent Labs  Lab 12/08/22 0438 12/09/22 0424 12/10/22 0436 12/11/22 0233 12/12/22 0304  NA 130* 133* 133* 132* 135  K 3.6 3.2* 3.3* 3.8 3.9  CL 106 99 99 100 102  CO2 15* 22 21* 22 22  GLUCOSE 84 101* 85 83 84  BUN 38* 32* 25* 19 16  CREATININE 1.47* 1.39* 1.17* 1.09* 1.06*  CALCIUM 7.9* 8.1* 8.1* 8.4* 8.3*  MG  --   --   --  1.7  --   PHOS 3.1 2.9 2.7 3.5 4.8*    CBC: Recent Labs  Lab 12/06/22 0841 12/07/22 0358 12/10/22 0436 12/11/22 0233  WBC 3.1* 4.3 9.5 12.2*  HGB 10.6* 8.3* 8.5* 8.2*  HCT 31.9* 25.8* 24.1* 23.3*  MCV 94.1 95.2 87.0 88.6  PLT 258 282 104* 151    LFT Recent Labs  Lab 12/08/22 0438 12/09/22 0424 12/10/22 0436 12/11/22 0233 12/12/22 0304  ALBUMIN 2.2* 2.3* 2.3* 2.4* 2.2*     Antibiotics: Anti-infectives (From admission, onward)    Start     Dose/Rate Route Frequency Ordered Stop   12/11/22 1400  vancomycin (VANCOREADY) IVPB 750 mg/150 mL        750 mg 150 mL/hr over 60 Minutes Intravenous Every 12 hours 12/10/22 1959     12/07/22 1400  DAPTOmycin  (CUBICIN) 500 mg in sodium chloride 0.9 % IVPB  Status:  Discontinued        500 mg 120 mL/hr over 30 Minutes Intravenous Daily 12/07/22 0804 12/10/22 1902   12/07/22 0000  vancomycin (VANCOCIN) IVPB 1000 mg/200 mL premix  Status:  Discontinued        1,000 mg 200 mL/hr over 60 Minutes Intravenous Every 24 hours 12/06/22 1002 12/07/22 0804   12/06/22 0300  vancomycin (VANCOREADY) IVPB 750 mg/150 mL  Status:  Discontinued        750 mg 150 mL/hr over 60 Minutes Intravenous Every 12 hours 12/05/22 1354 12/06/22 1001   12/05/22 1445  vancomycin (VANCOREADY) IVPB 1250 mg/250 mL        1,250 mg 166.7 mL/hr over 90 Minutes Intravenous  Once 12/05/22 1354 12/05/22 1743   12/04/22 1245  cefTRIAXone (ROCEPHIN) 2 g in sodium chloride 0.9 % 100 mL IVPB  Status:  Discontinued        2 g 200 mL/hr over 30 Minutes Intravenous Every 24 hours 12/04/22  1231 12/05/22 1348        DVT prophylaxis: SCDs  Code Status: Full code  Family Communication: Discussed with patient's mother at bedside   CONSULTS ID   Subjective   Continues to complain of pelvic pain.  She was seen by OB/GYN yesterday.  Did improve with vaginal Valium.    Physical exam Appears in moderate distress due to pain Heart S1-S2, regular Lungs clear to auscultation bilaterally Abdomen is soft, tenderness in lower abdomen, no organomegaly Extremities-trace edema in the lower extremities  Status is: Inpatient:             Meredeth Ide   [facility] If 7PM-7AM, please contact night-coverage at [email or web address], Office  [phone]   12/12/2022, 9:02 AM  LOS: 8 days

## 2022-12-12 NOTE — Progress Notes (Signed)
Patient is stable for transfer 

## 2022-12-12 NOTE — Progress Notes (Signed)
PT Cancellation Note  Patient Details Name: Angelica Blankenship MRN: [id number] DOB: [birth year]   Cancelled Treatment:    Reason Eval/Treat Not Completed: Patient's level of consciousness (per RN pt is minimally responsive, not able to participate in PT today, Will follow.)   Tamala Ser PT 12/12/2022  Acute Rehabilitation Services  Office [phone]

## 2022-12-12 NOTE — Progress Notes (Signed)
PHARMACY CONSULT NOTE FOR:  OUTPATIENT  PARENTERAL ANTIBIOTIC THERAPY (OPAT)  Indication: MRSA peritonitis  Regimen: Vancomycin 750 mg IV Q 12 hours  End date: 12/19/22    IV antibiotic discharge orders are pended. To discharging provider:  please sign these orders via discharge navigator,  Select New Orders & click on the button choice - Manage This Unsigned Work.     Thank you for allowing pharmacy to be a part of this patient's care.  Sharin Mons, PharmD, BCPS, BCIDP Infectious Diseases Clinical Pharmacist Phone: [phone] 12/12/2022, 8:11 AM

## 2022-12-12 NOTE — Progress Notes (Signed)
  Daily Progress Note   Patient Name: BRIAN KOCOUREK       Date: 12/12/2022 DOB: 03-17-1991  Age: 32 y.o. MRN#: [id number] Attending Physician: Meredeth Ide, MD Primary Care Physician: Marcine Matar, MD Admit Date: 12/04/2022 Length of Stay: 8 days  Discussed care with primary hospitalist on 12/11/22. Patient does not appear to currently have a life limiting illness. As per EMR review, patient has acute on chronic pain in the setting of her endometriosis with SBP already on antibiotic management. Gynecology already consulted to assist with management. Unfortunately palliative medication team is unable to assist with acute on chronic pain management in a patient who does not have a life limiting illness and accordingly palliative medicine consult will discontinued at this time. Should patient's medical status change, please reach out to palliative medicine team if can be of further assistance in the future. Thank you.    Alvester Morin, DO Palliative Care Provider PMT # [phone]

## 2022-12-12 NOTE — Progress Notes (Signed)
Bilateral lower extremity venous duplex has been completed. Preliminary results can be found in CV Proc through chart review.   12/12/22 10:14 AM Olen Cordial RVT

## 2022-12-12 NOTE — Progress Notes (Signed)
   12/12/22 1144  Assess: MEWS Score  Resp (!) 22  SpO2 97 %  O2 Device Nasal Cannula  O2 Flow Rate (L/min) 0 L/min  FiO2 (%) 32 %  Assess: MEWS Score  MEWS Temp 0  MEWS Systolic 0  MEWS Pulse 1  MEWS RR 1  MEWS LOC 0  MEWS Score 2  MEWS Score Color Yellow  Assess: if the MEWS score is Yellow or Red  Were vital signs taken at a resting state? Yes  Focused Assessment No change from prior assessment  Does the patient meet 2 or more of the SIRS criteria? Yes  Does the patient have a confirmed or suspected source of infection? No  MEWS guidelines implemented  No, previously yellow, continue vital signs every 4 hours  Assess: SIRS CRITERIA  SIRS Temperature  0  SIRS Pulse 1  SIRS Respirations  1  SIRS WBC 0  SIRS Score Sum  2

## 2022-12-14 ENCOUNTER — Telehealth: Payer: Self-pay

## 2022-12-14 ENCOUNTER — Other Ambulatory Visit (HOSPITAL_COMMUNITY): Payer: Self-pay

## 2022-12-14 NOTE — Telephone Encounter (Signed)
Received fax from [facility] stating Linezolid 600 mg tabs PA has been denied. 30 day supply has been approved from 6/6-7/6. Will forward message to MD so he is aware.  Juanita Laster, RMA

## 2022-12-16 LAB — BODY FLUID CULTURE W GRAM STAIN

## 2022-12-28 ENCOUNTER — Emergency Department (HOSPITAL_COMMUNITY)
Admission: EM | Admit: 2022-12-28 | Discharge: 2022-12-28 | Discharge status: Against Medical Advice | Payer: BC Managed Care – PPO | Attending: Emergency Medicine | Admitting: Emergency Medicine

## 2022-12-28 ENCOUNTER — Encounter (HOSPITAL_COMMUNITY): Payer: Self-pay | Admitting: Emergency Medicine

## 2022-12-28 ENCOUNTER — Other Ambulatory Visit: Payer: Self-pay

## 2022-12-28 DIAGNOSIS — R102 Pelvic and perineal pain: Secondary | ICD-10-CM | POA: Diagnosis present

## 2022-12-28 DIAGNOSIS — Z5321 Procedure and treatment not carried out due to patient leaving prior to being seen by health care provider: Secondary | ICD-10-CM | POA: Diagnosis not present

## 2022-12-28 NOTE — ED Notes (Signed)
Pt left on own accord 

## 2022-12-28 NOTE — ED Notes (Signed)
Pt cussing at triage staff. Talking about "how she hates this fucking hospital."

## 2022-12-28 NOTE — Progress Notes (Deleted)
Patient: Angelica Blankenship  DOB: 1990/08/11 MRN: [id number] PCP: Marcine Matar, MD  Referring Provider: hospital follow up    Patient Active Problem List   Diagnosis Date Noted   Spontaneous bacterial peritonitis (HCC) 12/04/2022   Normocytic anemia 12/04/2022   Hypokalemia 12/04/2022   Hypomagnesemia 12/04/2022   Enlarged thyroid 08/25/2022   Pulmonary nodule 10/26/2020   Essential hypertension 04/27/2020   Anemia 04/27/2020   Alteration in skin integrity related to surgical incision 03/26/2020   Chocolate cyst of ovary 03/19/2020   Endometriosis 03/11/2020   Adnexal mass 03/11/2020   Other ascites 03/11/2020   Complete miscarriage 09/12/2014     Subjective:   No chief complaint on file.    HPI: MELVIE PAGLIA is a 32 y.o. female  Seen during hospitalization by Dr. Renold Don in early June for MRSA in ascitic fluid. She has had recurrent ascites and chronic abdominal pain. 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 CT scan revealed no abscess in this area. Ascitic fluid grew out MRSA (R-bactrim and tetracyclines). She was bacteremic at the time and recommended 4 weeks of antibiotics.  -mri 11/08/22 extensive endometriosis with involvement umbilicus and rectum.   She was treated with 2 weeks of IV daptomycin from cleared blood cultures through 6/17 then placed on PO linezolid 600 mg bid to complete 2 more weeks through  7/1.   FU yesterday with [facility] for consult of extensive endometriosis involving the rectum as well. At this visit it was discussed that resection of the rectum was discussed including temporary ostomy bag.    She presented to [facility] ER 6/26 for pelvic pain but left prior to being seen.  Last paracentesis was 6/04 - GYN has been managing this for her apparently from what I can tell in notes.    ROS  Past Medical History:  Diagnosis Date   Ascites     Endometriosis    Headache    Heart murmur    asymptomatic   Hypertension    No medications prescribed    Outpatient Medications Prior to Visit  Medication Sig Dispense Refill   acetaminophen (TYLENOL) 500 MG tablet Take 1,000 mg by mouth every 6 (six) hours as needed for moderate pain.  (Patient not taking: Reported on 12/04/2022)     aspirin-acetaminophen-caffeine (EXCEDRIN MIGRAINE) 250-250-65 MG tablet Take 1 tablet by mouth every 6 (six) hours as needed for headache. (Patient not taking: Reported on 12/04/2022)     HYDROcodone-acetaminophen (NORCO/VICODIN) 5-325 MG tablet Take 2 tablets by mouth every 4 (four) hours as needed. 8 tablet 0   metroNIDAZOLE (METROGEL) 0.75 % vaginal gel Place 1 Applicatorful vaginally at bedtime.     Multiple Vitamin (MULTIVITAMIN WITH MINERALS) TABS tablet Take 1 tablet by mouth daily.     senna-docusate (SENOKOT-S) 8.6-50 MG tablet Take 2 tablets by mouth at bedtime. For AFTER surgery, do not take if having diarrhea (Patient not taking: Reported on 12/04/2022) 30 tablet 0   No facility-administered medications prior to visit.     No Known Allergies  Social History   Tobacco Use   Smoking status: Former    Packs/day: 0.10    Years: 8.00    Additional pack years: 0.00    Total pack years: 0.80    Types: Cigars, Cigarettes    Quit date: 12/02/2016    Years since quitting: 6.0   Smokeless tobacco: Never  Vaping Use   Vaping Use: Never used  Substance Use Topics   Alcohol use: Yes    Comment: occasional   Drug use: Yes    Frequency: 35.0 times per week    Types: Marijuana    Comment: uses daily    Family History  Problem Relation Age of Onset   Hypertension Mother    Hypertension Sister    Breast cancer Maternal Aunt    Hypertension Maternal Grandmother    Cervical cancer Maternal Grandmother    Ovarian cancer Neg Hx    Uterine cancer Neg Hx    Colon cancer Neg Hx     Objective:  There were no vitals filed for this visit. There is no  height or weight on file to calculate BMI.  Physical Exam  Lab Results: Lab Results  Component Value Date   WBC 12.2 (H) 12/11/2022   HGB 8.2 (L) 12/11/2022   HCT 23.3 (L) 12/11/2022   MCV 88.6 12/11/2022   PLT 151 12/11/2022    Lab Results  Component Value Date   CREATININE 1.06 (H) 12/12/2022   BUN 16 12/12/2022   NA 135 12/12/2022   K 3.9 12/12/2022   CL 102 12/12/2022   CO2 22 12/12/2022    Lab Results  Component Value Date   ALT 13 12/05/2022   AST 18 12/05/2022   ALKPHOS 17 (L) 12/05/2022   BILITOT 1.2 12/05/2022     Assessment & Plan:   Problem List Items Addressed This Visit   None   *** will return to clinic in *** {weeks/months} for follow up  Rexene Alberts, MSN, NP-C [facility] Pager: [phone] Office: [phone]  12/28/22  4:46 PM

## 2022-12-28 NOTE — ED Triage Notes (Signed)
Pt BIB EMS with c/o pelvic pain. Pt has stage 4 endometriosis. Last pain med was 2230.  Pt uncooperative in triage.

## 2022-12-29 ENCOUNTER — Other Ambulatory Visit: Payer: Self-pay

## 2022-12-29 ENCOUNTER — Ambulatory Visit: Payer: BC Managed Care – PPO | Admitting: Infectious Diseases
